# Patient Record
Sex: Female | Born: 1960 | Race: Black or African American | Hispanic: No | Marital: Single | Smoking: Former smoker
Health system: Southern US, Community
[De-identification: ages and names within clinical notes are randomized; demographics above are authoritative.]

## PROBLEM LIST (undated history)

## (undated) DIAGNOSIS — Z9119 Patient's noncompliance with other medical treatment and regimen: Secondary | ICD-10-CM

## (undated) DIAGNOSIS — I5022 Chronic systolic (congestive) heart failure: Secondary | ICD-10-CM

## (undated) DIAGNOSIS — F1210 Cannabis abuse, uncomplicated: Secondary | ICD-10-CM

## (undated) DIAGNOSIS — Z9289 Personal history of other medical treatment: Secondary | ICD-10-CM

## (undated) DIAGNOSIS — D219 Benign neoplasm of connective and other soft tissue, unspecified: Secondary | ICD-10-CM

## (undated) DIAGNOSIS — Z91199 Patient's noncompliance with other medical treatment and regimen due to unspecified reason: Secondary | ICD-10-CM

## (undated) DIAGNOSIS — E7800 Pure hypercholesterolemia, unspecified: Secondary | ICD-10-CM

## (undated) DIAGNOSIS — F1410 Cocaine abuse, uncomplicated: Secondary | ICD-10-CM

## (undated) DIAGNOSIS — I050 Rheumatic mitral stenosis: Secondary | ICD-10-CM

## (undated) DIAGNOSIS — I428 Other cardiomyopathies: Secondary | ICD-10-CM

## (undated) DIAGNOSIS — I472 Ventricular tachycardia: Secondary | ICD-10-CM

## (undated) DIAGNOSIS — F419 Anxiety disorder, unspecified: Secondary | ICD-10-CM

## (undated) DIAGNOSIS — J449 Chronic obstructive pulmonary disease, unspecified: Secondary | ICD-10-CM

## (undated) DIAGNOSIS — I509 Heart failure, unspecified: Secondary | ICD-10-CM

## (undated) DIAGNOSIS — Z95810 Presence of automatic (implantable) cardiac defibrillator: Secondary | ICD-10-CM

## (undated) DIAGNOSIS — R0602 Shortness of breath: Secondary | ICD-10-CM

## (undated) DIAGNOSIS — N83209 Unspecified ovarian cyst, unspecified side: Secondary | ICD-10-CM

## (undated) DIAGNOSIS — I272 Pulmonary hypertension, unspecified: Secondary | ICD-10-CM

## (undated) DIAGNOSIS — I10 Essential (primary) hypertension: Secondary | ICD-10-CM

## (undated) DIAGNOSIS — I080 Rheumatic disorders of both mitral and aortic valves: Secondary | ICD-10-CM

## (undated) DIAGNOSIS — F1010 Alcohol abuse, uncomplicated: Secondary | ICD-10-CM

## (undated) DIAGNOSIS — R911 Solitary pulmonary nodule: Secondary | ICD-10-CM

## (undated) HISTORY — PX: CARDIAC CATHETERIZATION: SHX172

## (undated) HISTORY — DX: Patient's noncompliance with other medical treatment and regimen: Z91.19

## (undated) HISTORY — DX: Cocaine abuse, uncomplicated: F14.10

## (undated) HISTORY — DX: Alcohol abuse, uncomplicated: F10.10

## (undated) HISTORY — DX: Other cardiomyopathies: I42.8

## (undated) HISTORY — PX: CARDIAC VALVE REPLACEMENT: SHX585

## (undated) HISTORY — DX: Cannabis abuse, uncomplicated: F12.10

## (undated) HISTORY — DX: Benign neoplasm of connective and other soft tissue, unspecified: D21.9

## (undated) HISTORY — DX: Pulmonary hypertension, unspecified: I27.20

## (undated) HISTORY — DX: Chronic systolic (congestive) heart failure: I50.22

## (undated) HISTORY — DX: Rheumatic mitral stenosis: I05.0

## (undated) HISTORY — DX: Rheumatic disorders of both mitral and aortic valves: I08.0

## (undated) HISTORY — DX: Unspecified ovarian cyst, unspecified side: N83.209

## (undated) HISTORY — PX: TONSILLECTOMY: SUR1361

## (undated) HISTORY — DX: Chronic obstructive pulmonary disease, unspecified: J44.9

## (undated) HISTORY — DX: Solitary pulmonary nodule: R91.1

## (undated) HISTORY — DX: Patient's noncompliance with other medical treatment and regimen due to unspecified reason: Z91.199

---
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
## 1998-01-22 ENCOUNTER — Emergency Department (HOSPITAL_COMMUNITY): Admission: EM | Admit: 1998-01-22 | Discharge: 1998-01-22 | Payer: Self-pay | Admitting: Emergency Medicine

## 1999-08-12 ENCOUNTER — Emergency Department (HOSPITAL_COMMUNITY): Admission: EM | Admit: 1999-08-12 | Discharge: 1999-08-12 | Payer: Self-pay | Admitting: Emergency Medicine

## 1999-08-13 ENCOUNTER — Emergency Department (HOSPITAL_COMMUNITY): Admission: EM | Admit: 1999-08-13 | Discharge: 1999-08-13 | Payer: Self-pay | Admitting: Emergency Medicine

## 1999-08-13 ENCOUNTER — Encounter: Payer: Self-pay | Admitting: Emergency Medicine

## 2004-02-02 ENCOUNTER — Inpatient Hospital Stay (HOSPITAL_COMMUNITY): Admission: EM | Admit: 2004-02-02 | Discharge: 2004-02-07 | Payer: Self-pay | Admitting: Emergency Medicine

## 2004-02-03 ENCOUNTER — Encounter: Payer: Self-pay | Admitting: Cardiology

## 2004-02-05 ENCOUNTER — Encounter (INDEPENDENT_AMBULATORY_CARE_PROVIDER_SITE_OTHER): Payer: Self-pay | Admitting: *Deleted

## 2004-02-27 ENCOUNTER — Ambulatory Visit: Payer: Self-pay | Admitting: Cardiology

## 2004-03-16 ENCOUNTER — Ambulatory Visit: Payer: Self-pay | Admitting: Cardiology

## 2004-04-22 ENCOUNTER — Ambulatory Visit: Payer: Self-pay | Admitting: Cardiology

## 2004-04-28 ENCOUNTER — Ambulatory Visit: Payer: Self-pay

## 2006-05-08 ENCOUNTER — Emergency Department (HOSPITAL_COMMUNITY): Admission: EM | Admit: 2006-05-08 | Discharge: 2006-05-08 | Payer: Self-pay | Admitting: Emergency Medicine

## 2006-05-19 ENCOUNTER — Emergency Department (HOSPITAL_COMMUNITY): Admission: EM | Admit: 2006-05-19 | Discharge: 2006-05-19 | Payer: Self-pay | Admitting: Emergency Medicine

## 2006-06-11 ENCOUNTER — Emergency Department (HOSPITAL_COMMUNITY): Admission: EM | Admit: 2006-06-11 | Discharge: 2006-06-11 | Payer: Self-pay | Admitting: Emergency Medicine

## 2006-10-14 ENCOUNTER — Inpatient Hospital Stay (HOSPITAL_COMMUNITY): Admission: EM | Admit: 2006-10-14 | Discharge: 2006-10-18 | Payer: Self-pay | Admitting: Emergency Medicine

## 2006-10-14 ENCOUNTER — Ambulatory Visit: Payer: Self-pay | Admitting: Internal Medicine

## 2006-10-14 ENCOUNTER — Encounter: Payer: Self-pay | Admitting: Cardiovascular Disease

## 2007-05-23 ENCOUNTER — Emergency Department (HOSPITAL_COMMUNITY): Admission: EM | Admit: 2007-05-23 | Discharge: 2007-05-23 | Payer: Self-pay | Admitting: Emergency Medicine

## 2007-09-11 ENCOUNTER — Emergency Department (HOSPITAL_COMMUNITY): Admission: EM | Admit: 2007-09-11 | Discharge: 2007-09-11 | Payer: Self-pay | Admitting: Emergency Medicine

## 2007-10-01 ENCOUNTER — Emergency Department (HOSPITAL_COMMUNITY): Admission: EM | Admit: 2007-10-01 | Discharge: 2007-10-02 | Payer: Self-pay | Admitting: Emergency Medicine

## 2007-12-14 ENCOUNTER — Ambulatory Visit: Payer: Self-pay | Admitting: Cardiology

## 2007-12-14 LAB — CONVERTED CEMR LAB
BUN: 10 mg/dL (ref 6–23)
CO2: 28 meq/L (ref 19–32)
Calcium: 8.4 mg/dL (ref 8.4–10.5)
Chloride: 105 meq/L (ref 96–112)
Creatinine, Ser: 1 mg/dL (ref 0.4–1.2)
GFR calc Af Amer: 76 mL/min
GFR calc non Af Amer: 63 mL/min
Glucose, Bld: 108 mg/dL — ABNORMAL HIGH (ref 70–99)
Potassium: 3.3 meq/L — ABNORMAL LOW (ref 3.5–5.1)
Sodium: 140 meq/L (ref 135–145)

## 2007-12-29 ENCOUNTER — Ambulatory Visit: Payer: Self-pay

## 2007-12-29 ENCOUNTER — Encounter: Payer: Self-pay | Admitting: Cardiology

## 2008-03-18 ENCOUNTER — Ambulatory Visit: Payer: Self-pay | Admitting: Cardiology

## 2008-07-17 ENCOUNTER — Emergency Department (HOSPITAL_COMMUNITY): Admission: EM | Admit: 2008-07-17 | Discharge: 2008-07-17 | Payer: Self-pay | Admitting: Emergency Medicine

## 2008-07-17 ENCOUNTER — Ambulatory Visit: Payer: Self-pay | Admitting: Cardiovascular Disease

## 2008-11-07 ENCOUNTER — Telehealth: Payer: Self-pay | Admitting: Cardiology

## 2008-12-27 DIAGNOSIS — I2510 Atherosclerotic heart disease of native coronary artery without angina pectoris: Secondary | ICD-10-CM | POA: Insufficient documentation

## 2008-12-27 DIAGNOSIS — I10 Essential (primary) hypertension: Secondary | ICD-10-CM

## 2008-12-27 DIAGNOSIS — J449 Chronic obstructive pulmonary disease, unspecified: Secondary | ICD-10-CM | POA: Insufficient documentation

## 2008-12-27 DIAGNOSIS — Z8639 Personal history of other endocrine, nutritional and metabolic disease: Secondary | ICD-10-CM

## 2008-12-27 DIAGNOSIS — R079 Chest pain, unspecified: Secondary | ICD-10-CM

## 2008-12-27 DIAGNOSIS — I428 Other cardiomyopathies: Secondary | ICD-10-CM | POA: Insufficient documentation

## 2008-12-27 DIAGNOSIS — Z862 Personal history of diseases of the blood and blood-forming organs and certain disorders involving the immune mechanism: Secondary | ICD-10-CM | POA: Insufficient documentation

## 2008-12-27 DIAGNOSIS — Z9189 Other specified personal risk factors, not elsewhere classified: Secondary | ICD-10-CM | POA: Insufficient documentation

## 2008-12-27 DIAGNOSIS — R1013 Epigastric pain: Secondary | ICD-10-CM

## 2008-12-27 DIAGNOSIS — J984 Other disorders of lung: Secondary | ICD-10-CM

## 2008-12-27 DIAGNOSIS — R609 Edema, unspecified: Secondary | ICD-10-CM | POA: Insufficient documentation

## 2008-12-27 DIAGNOSIS — R0602 Shortness of breath: Secondary | ICD-10-CM

## 2008-12-27 DIAGNOSIS — N83209 Unspecified ovarian cyst, unspecified side: Secondary | ICD-10-CM

## 2008-12-27 DIAGNOSIS — I5021 Acute systolic (congestive) heart failure: Secondary | ICD-10-CM

## 2008-12-27 DIAGNOSIS — Z8679 Personal history of other diseases of the circulatory system: Secondary | ICD-10-CM | POA: Insufficient documentation

## 2008-12-27 DIAGNOSIS — R0601 Orthopnea: Secondary | ICD-10-CM | POA: Insufficient documentation

## 2008-12-27 DIAGNOSIS — F1021 Alcohol dependence, in remission: Secondary | ICD-10-CM

## 2008-12-27 HISTORY — DX: Chronic obstructive pulmonary disease, unspecified: J44.9

## 2008-12-30 ENCOUNTER — Ambulatory Visit: Payer: Self-pay | Admitting: Cardiology

## 2009-01-02 LAB — CONVERTED CEMR LAB
BUN: 13 mg/dL (ref 6–23)
CO2: 28 meq/L (ref 19–32)
Calcium: 8.7 mg/dL (ref 8.4–10.5)
Chloride: 107 meq/L (ref 96–112)
Creatinine, Ser: 1 mg/dL (ref 0.4–1.2)
GFR calc non Af Amer: 75.97 mL/min (ref >60)
Glucose, Bld: 85 mg/dL (ref 70–99)
Magnesium: 1.8 mg/dL (ref 1.5–2.5)
Potassium: 4 meq/L (ref 3.5–5.1)
Sodium: 140 meq/L (ref 135–145)

## 2009-01-03 ENCOUNTER — Ambulatory Visit: Payer: Self-pay | Admitting: Oncology

## 2009-01-15 ENCOUNTER — Encounter: Payer: Self-pay | Admitting: Cardiology

## 2009-01-15 LAB — CBC WITH DIFFERENTIAL/PLATELET
BASO%: 0.7 % (ref 0.0–2.0)
Basophils Absolute: 0 10e3/uL (ref 0.0–0.1)
EOS%: 3.3 % (ref 0.0–7.0)
Eosinophils Absolute: 0.1 10e3/uL (ref 0.0–0.5)
HCT: 40.6 % (ref 34.8–46.6)
HGB: 14.1 g/dL (ref 11.6–15.9)
LYMPH%: 27.8 % (ref 14.0–49.7)
MCH: 32.3 pg (ref 25.1–34.0)
MCHC: 34.7 g/dL (ref 31.5–36.0)
MCV: 93.1 fL (ref 79.5–101.0)
MONO#: 0.6 10e3/uL (ref 0.1–0.9)
MONO%: 14.1 % — ABNORMAL HIGH (ref 0.0–14.0)
NEUT#: 2.3 10e3/uL (ref 1.5–6.5)
NEUT%: 54.1 % (ref 38.4–76.8)
Platelets: 201 10e3/uL (ref 145–400)
RBC: 4.36 10e6/uL (ref 3.70–5.45)
RDW: 13 % (ref 11.2–14.5)
WBC: 4.2 10e3/uL (ref 3.9–10.3)
lymph#: 1.2 10e3/uL (ref 0.9–3.3)

## 2009-01-15 LAB — CHCC SMEAR

## 2009-01-17 LAB — COMPREHENSIVE METABOLIC PANEL
AST: 23 U/L (ref 0–37)
Albumin: 4.5 g/dL (ref 3.5–5.2)
Alkaline Phosphatase: 60 U/L (ref 39–117)
Potassium: 3.8 mEq/L (ref 3.5–5.3)
Sodium: 139 mEq/L (ref 135–145)
Total Bilirubin: 0.6 mg/dL (ref 0.3–1.2)
Total Protein: 7.1 g/dL (ref 6.0–8.3)

## 2009-01-17 LAB — SPEP & IFE WITH QIG
Albumin ELP: 60.4 % (ref 55.8–66.1)
Alpha-1-Globulin: 4.4 % (ref 2.9–4.9)
Beta 2: 4.4 % (ref 3.2–6.5)
Gamma Globulin: 15.4 % (ref 11.1–18.8)
IgA: 380 mg/dL — ABNORMAL HIGH (ref 68–378)
IgM, Serum: 142 mg/dL (ref 60–263)

## 2009-01-17 LAB — KAPPA/LAMBDA LIGHT CHAINS
Kappa:Lambda Ratio: 1.03 (ref 0.26–1.65)
Lambda Free Lght Chn: 0.91 mg/dL (ref 0.57–2.63)

## 2009-01-21 LAB — UIFE/LIGHT CHAINS/TP QN, 24-HR UR
Alpha 2, Urine: DETECTED — AB
Free Lambda Lt Chains,Ur: <0.05 mg/dL (ref 0.08–1.01)
Free Lt Chn Excr Rate: 9.44 mg/d
Time: 24 hours
Total Protein, Urine-Ur/day: 18 mg/d (ref 10–140)
Total Protein, Urine: 1.1 mg/dL

## 2009-03-21 ENCOUNTER — Telehealth: Payer: Self-pay | Admitting: Cardiology

## 2009-05-02 ENCOUNTER — Encounter (INDEPENDENT_AMBULATORY_CARE_PROVIDER_SITE_OTHER): Payer: Self-pay | Admitting: *Deleted

## 2010-05-19 NOTE — Letter (Signed)
Summary: Appointment - Reminder 2  [facility], Main Office  [address]   [place], [place] [address]   Phone: [phone]  Fax: [phone]     May 02, 2009 MRN: [id number]   HANEEN BERNALES [address] [place], [place]  [address]   Dear Ms. Montroy,  Our records indicate that it is time to schedule a follow-up appointment. Dr.Crenshaw recommended that you follow up with [place] in March,2011. It is very important that we reach you to schedule this appointment. We look forward to participating in your health care needs. Please contact us at the number listed above at your earliest convenience to schedule your appointment.  If you are unable to make an appointment at this time, give [place] a call so we can update our records.     Sincerely,   Glass blower/designer

## 2010-05-26 ENCOUNTER — Other Ambulatory Visit: Payer: Self-pay | Admitting: Cardiology

## 2010-05-26 ENCOUNTER — Encounter: Payer: Self-pay | Admitting: Cardiology

## 2010-05-26 ENCOUNTER — Ambulatory Visit (INDEPENDENT_AMBULATORY_CARE_PROVIDER_SITE_OTHER): Payer: Self-pay | Admitting: Cardiology

## 2010-05-26 DIAGNOSIS — R1900 Intra-abdominal and pelvic swelling, mass and lump, unspecified site: Secondary | ICD-10-CM | POA: Insufficient documentation

## 2010-05-26 DIAGNOSIS — I428 Other cardiomyopathies: Secondary | ICD-10-CM

## 2010-05-26 DIAGNOSIS — I10 Essential (primary) hypertension: Secondary | ICD-10-CM

## 2010-05-26 DIAGNOSIS — R911 Solitary pulmonary nodule: Secondary | ICD-10-CM

## 2010-05-26 DIAGNOSIS — I359 Nonrheumatic aortic valve disorder, unspecified: Secondary | ICD-10-CM

## 2010-05-26 DIAGNOSIS — IMO0002 Reserved for concepts with insufficient information to code with codable children: Secondary | ICD-10-CM

## 2010-05-26 DIAGNOSIS — R0602 Shortness of breath: Secondary | ICD-10-CM

## 2010-05-26 LAB — HEPATIC FUNCTION PANEL
ALT: 13 U/L (ref 0–35)
AST: 21 U/L (ref 0–37)
Albumin: 3.9 g/dL (ref 3.5–5.2)
Alkaline Phosphatase: 60 U/L (ref 39–117)
Bilirubin, Direct: 0.1 mg/dL (ref 0.0–0.3)
Total Bilirubin: 0.3 mg/dL (ref 0.3–1.2)
Total Protein: 7.1 g/dL (ref 6.0–8.3)

## 2010-05-26 LAB — BASIC METABOLIC PANEL
BUN: 10 mg/dL (ref 6–23)
CO2: 26 mEq/L (ref 19–32)
Calcium: 9 mg/dL (ref 8.4–10.5)
Chloride: 106 mEq/L (ref 96–112)
Creatinine, Ser: 0.7 mg/dL (ref 0.4–1.2)
GFR: 117.87 mL/min (ref >60.00)
Glucose, Bld: 104 mg/dL — ABNORMAL HIGH (ref 70–99)
Potassium: 3.5 mEq/L (ref 3.5–5.1)
Sodium: 142 mEq/L (ref 135–145)

## 2010-05-26 LAB — CBC WITH DIFFERENTIAL/PLATELET
Basophils Absolute: 0 10*3/uL (ref 0.0–0.1)
Basophils Relative: 0.9 % (ref 0.0–3.0)
Eosinophils Absolute: 0.1 10*3/uL (ref 0.0–0.7)
Eosinophils Relative: 2 % (ref 0.0–5.0)
HCT: 39.6 % (ref 36.0–46.0)
Hemoglobin: 13.6 g/dL (ref 12.0–15.0)
Lymphocytes Relative: 46.5 % — ABNORMAL HIGH (ref 12.0–46.0)
Lymphs Abs: 1.2 10*3/uL (ref 0.7–4.0)
MCHC: 34.3 g/dL (ref 30.0–36.0)
MCV: 97.6 fl (ref 78.0–100.0)
Monocytes Absolute: 0.4 10*3/uL (ref 0.1–1.0)
Monocytes Relative: 16.8 % — ABNORMAL HIGH (ref 3.0–12.0)
Neutro Abs: 0.9 10*3/uL — ABNORMAL LOW (ref 1.4–7.7)
Neutrophils Relative %: 33.8 % — ABNORMAL LOW (ref 43.0–77.0)
Platelets: 210 10*3/uL (ref 150.0–400.0)
RBC: 4.06 Mil/uL (ref 3.87–5.11)
RDW: 13.5 % (ref 11.5–14.6)
WBC: 2.6 10*3/uL — ABNORMAL LOW (ref 4.5–10.5)

## 2010-05-26 LAB — TSH: TSH: 2.11 u[IU]/mL (ref 0.35–5.50)

## 2010-05-26 LAB — BRAIN NATRIURETIC PEPTIDE: Pro B Natriuretic peptide (BNP): 460.7 pg/mL — ABNORMAL HIGH (ref 0.0–100.0)

## 2010-05-27 ENCOUNTER — Telehealth: Payer: Self-pay | Admitting: Cardiology

## 2010-06-01 ENCOUNTER — Other Ambulatory Visit: Payer: Self-pay

## 2010-06-01 ENCOUNTER — Ambulatory Visit (HOSPITAL_COMMUNITY): Payer: Self-pay | Attending: Cardiology

## 2010-06-01 ENCOUNTER — Ambulatory Visit (INDEPENDENT_AMBULATORY_CARE_PROVIDER_SITE_OTHER)
Admission: RE | Admit: 2010-06-01 | Discharge: 2010-06-01 | Disposition: A | Discharge status: Home / Self Care | Payer: Self-pay | Source: Ambulatory Visit | Attending: Cardiology | Admitting: Cardiology

## 2010-06-01 DIAGNOSIS — I319 Disease of pericardium, unspecified: Secondary | ICD-10-CM | POA: Insufficient documentation

## 2010-06-01 DIAGNOSIS — I359 Nonrheumatic aortic valve disorder, unspecified: Secondary | ICD-10-CM

## 2010-06-01 DIAGNOSIS — I10 Essential (primary) hypertension: Secondary | ICD-10-CM | POA: Insufficient documentation

## 2010-06-01 DIAGNOSIS — J984 Other disorders of lung: Secondary | ICD-10-CM

## 2010-06-01 DIAGNOSIS — R229 Localized swelling, mass and lump, unspecified: Secondary | ICD-10-CM

## 2010-06-01 DIAGNOSIS — I2510 Atherosclerotic heart disease of native coronary artery without angina pectoris: Secondary | ICD-10-CM | POA: Insufficient documentation

## 2010-06-01 DIAGNOSIS — I080 Rheumatic disorders of both mitral and aortic valves: Secondary | ICD-10-CM | POA: Insufficient documentation

## 2010-06-01 DIAGNOSIS — I379 Nonrheumatic pulmonary valve disorder, unspecified: Secondary | ICD-10-CM | POA: Insufficient documentation

## 2010-06-01 DIAGNOSIS — R911 Solitary pulmonary nodule: Secondary | ICD-10-CM

## 2010-06-01 DIAGNOSIS — I079 Rheumatic tricuspid valve disease, unspecified: Secondary | ICD-10-CM | POA: Insufficient documentation

## 2010-06-01 HISTORY — DX: Essential (primary) hypertension: I10

## 2010-06-01 HISTORY — DX: Heart failure, unspecified: I50.9

## 2010-06-01 MED ORDER — IOHEXOL 300 MG/ML  SOLN
100.0000 mL | Freq: Once | INTRAMUSCULAR | Status: AC | PRN
  Administered 2010-06-01: 100 mL via INTRAVENOUS

## 2010-06-03 ENCOUNTER — Other Ambulatory Visit: Payer: Self-pay

## 2010-06-04 NOTE — Progress Notes (Signed)
Summary: returning call about medication changes  Medications Added CARVEDILOL 12.5 MG TABS (CARVEDILOL) Take one tablet by mouth twice a day CARVEDILOL 12.5 MG TABS (CARVEDILOL) Take one and one half tablet by mouth twice a day FUROSEMIDE 40 MG TABS (FUROSEMIDE) 1 and 1/2 tabs bid FUROSEMIDE 40 MG TABS (FUROSEMIDE) 1 tab by mouth two times a day ENALAPRIL MALEATE 20 MG TABS (ENALAPRIL MALEATE) Take one tablet by mouth twice a day AMLODIPINE BESYLATE 10 MG TABS (AMLODIPINE BESYLATE) Take one tablet by mouth daily KLOR-CON M20 20 MEQ CR-TABS (POTASSIUM CHLORIDE CRYS CR) take 1 tab daily       Phone Note Call from Patient Call back at Home Phone [phone]   Caller: Patient Summary of Call: Pt returning call about her medications Initial call taken by: Judie Grieve,  May 27, 2010 10:03 AM  Follow-up for Phone Call        05/27/10--1000am--    New/Updated Medications: FUROSEMIDE 40 MG TABS (FUROSEMIDE) 1 and 1/2 tabs bid KLOR-CON M20 20 MEQ CR-TABS (POTASSIUM CHLORIDE CRYS CR) take 1 tab daily Prescriptions: KLOR-CON M20 20 MEQ CR-TABS (POTASSIUM CHLORIDE CRYS CR) take 1 tab daily  #30 x 10   Entered by:   Ledon Snare, RN   Authorized by:   Ferman Hamming, MD, [facility]   Signed by:   Ledon Snare, RN on 05/27/2010   Method used:   Electronically to        [facility] (retail)       [address]       [place], [place]  [address]       Ph: [phone]       Fax: [phone]   RxID:   [id number]   Appended Document: returning call about medication changes 05/27/10--1030am--i did speak with ms Lemay who will change lasix to 60mg  two times a day and klor-con--20mg  1 tab daily--she will come in for repeat labs on 06/01/10

## 2010-06-04 NOTE — Assessment & Plan Note (Signed)
Summary: SOB/PER PT CALL=MJ      Allergies Added: ! PCN  Visit Type:  Follow-up  CC:  sob.  History of Present Illness: [name] is a pleasant  female who returns for followup today.  The patient has a long history of severe hypertension and a cardiomyopathy. Cardiac catheterization in June of 2008 showed ejection fraction of 25% and nonobstructive coronary disease. There was moderate aortic insufficiency. Her previous workup in 2005 suggested the possibility of amyloid.  At that time, she had a normal ferritin level, normal ACE level, normal TSH, and negative HIV.  A fat pad biopsy to rule out amyloidosis was negative.  However, we also scheduled her to have free kappa light chains which were elevated at 4.16 and her lambda free light chains were 0.97 which was normal.  However, her kappa to lambda free light chain ratio was elevated at 4.29.  Last echocardiogram in September of 2009 revealed an ejection fraction of 35 to 40%.  Left ventricular wall thickness was markedly increased. There was mild to moderate aortic insufficiency and the mean gradient was 21 mm of mercury. There was mild mitral regurgitation. The left atrium was moderately to markedly dilated. I last saw her in September of 2010. Note there has been issues with followup as she has not returned for extended length of time. She was seen by hematology oncology in September of 2010 for question amyloidosis. Followup laboratories were to be obtained. However she did not followup. Note she was seen in the emergency room in March of 2010 with chest pain. A CT revealed a right upper lobe nodule. Since I last saw her, the patient complains of dyspnea on exertion over the past 3 weeks. There is occasional orthopnea but no PND, pedal edema, palpitations, syncope or chest pain. There is no fevers, chills or productive cough.  Current Medications (verified): 1)  Carvedilol 12.5 Mg Tabs (Carvedilol) .... Take One and One Half Tablet By Mouth Twice  A Day 2)  Furosemide 40 Mg Tabs (Furosemide) .Marland Kitchen.. 1 Tab By Mouth Two Times A Day 3)  Enalapril Maleate 20 Mg Tabs (Enalapril Maleate) .... Take One Tablet By Mouth Twice A Day 4)  Amlodipine Besylate 10 Mg Tabs (Amlodipine Besylate) .... Take One Tablet By Mouth Daily  Allergies (verified): 1)  ! Pcn  Past History:  Past Medical History: PULMONARY NODULE, LEFT UPPER LOBE (ICD-518.89) EMPHYSEMA (ICD-492.8) OVARIAN CYSTIC MASS (ICD-620.2) COCAINE ABUSE, HX OF (ICD-V15.9) ALCOHOL ABUSE, HX OF (ICD-V11.3) PULMONARY HYPERTENSION, HX OF (ICD-V12.59) AORTIC INSUFFICIENCY, HX OF (ICD-424.1) MITRAL REGURGITATION,HX OF (ICD-396.3) CARDIOMYOPATHY (ICD-425.4) TUBAL PREGNANCY,HX OF (ICD-633.10) CANNABIS ABUSE, HX OF (ICD-V15.89) FIBROIDS, UTERUS,HX OF (ICD-218.9) PERS HX NONCOMPLIANCE W/MED TX PRS HAZARDS HLTH,HX OF (ICD-V15.81) HYPERTENSION (ICD-401.9) CHF (ICD-428.0)  Past Surgical History: TUBAL LIGATION, HX OF (ICD-V26.51)  Social History: Reviewed history from 12/30/2008 and no changes required. Single no children Tobacco Use - hx of  tobacco x 20 yrs Drug Use - hx of cocaine & Marijuana Tobacco Use - Yes. Patient states she has discontinued for one year.  Review of Systems       no fevers or chills, productive cough, hemoptysis, dysphasia, odynophagia, melena, hematochezia, dysuria, hematuria, rash, seizure activity, orthopnea, PND, pedal edema, claudication. Remaining systems are negative.   Vital Signs:  Patient profile:   50 year old female Height:      65 inches Weight:      164 pounds BMI:     27.39 Pulse rate:   76 / minute BP  sitting:   98 / 70  (right arm)  Vitals Entered By: Jacquelin Hawking, CMA (May 26, 2010 8:29 AM)  Physical Exam  General:  [name] well-nourished in no acute distress.  Skin is warm and dry.  HEENT is normal.  Neck is supple. No thyromegaly.  Chest is clear to auscultation with normal expansion.  Cardiovascular exam is  regular rate and rhythm. 3/6 systolic murmur left sternal border. Abdominal exam nontender or distended. Mass palpated in the lower abdomen. Patient states she has previously been diagnosed with fibroids. Extremities show no edema. neuro grossly intact    EKG  Procedure date:  05/26/2010  Findings:      Sinus rhythm at a rate of 67. Occasional PVC. Prolonged QT interval. Inferolateral T-wave inversion.  Impression & Recommendations:  Problem # 1:  DYSPNEA (ICD-786.05) Patient complains of dyspnea on exertion over the past 3 weeks. Her symptoms sound to be possibly related to congestive heart failure. However she is not volume overloaded on examination. Repeat echocardiogram. Check BNP. Will also check potassium, renal function, LFTs, TSH and CBC. She has a nodule on previous CT scan and has not had followup. Repeat CT scan of her chest. Will also scan her abdomen given the palpable mass. This may be related to fibroid tumors but there was a cystic mass on previous ultrasound. The patient has had significant problems with noncompliance and poor follow up in the past. She does not come for appointments for scheduled tests. I explained today that if she continues to not followup I will discharge her from the practice as I am unable to provide care in this setting. Patient also was being evaluated by oncology for question amyloid. I will arrange a followup appointment with Dr.Shadad. Her updated medication list for this problem includes:    Carvedilol 12.5 Mg Tabs (Carvedilol) .Marland Kitchen... Take one and one half tablet by mouth twice a day    Furosemide 40 Mg Tabs (Furosemide) .Marland Kitchen... 1 tab by mouth two times a day    Enalapril Maleate 20 Mg Tabs (Enalapril maleate) .Marland Kitchen... Take one tablet by mouth twice a day    Amlodipine Besylate 10 Mg Tabs (Amlodipine besylate) .Marland Kitchen... Take one tablet by mouth daily  Problem # 2:  OVARIAN CYSTIC MASS (ICD-620.2) Plan abdominal CT.  Problem # 3:  AORTIC INSUFFICIENCY, HX  OF (ICD-424.1)  Patient with history of aortic stenosis/aortic insufficiency. Repeat echocardiogram. Her updated medication list for this problem includes:    Carvedilol 12.5 Mg Tabs (Carvedilol) .Marland Kitchen... Take one and one half tablet by mouth twice a day    Furosemide 40 Mg Tabs (Furosemide) .Marland Kitchen... 1 tab by mouth two times a day    Enalapril Maleate 20 Mg Tabs (Enalapril maleate) .Marland Kitchen... Take one tablet by mouth twice a day  Her updated medication list for this problem includes:    Carvedilol 12.5 Mg Tabs (Carvedilol) .Marland Kitchen... Take one and one half tablet by mouth twice a day    Furosemide 40 Mg Tabs (Furosemide) .Marland Kitchen... 1 tab by mouth two times a day    Enalapril Maleate 20 Mg Tabs (Enalapril maleate) .Marland Kitchen... Take one tablet by mouth twice a day  Orders: Echocardiogram (Echo)  Problem # 4:  CARDIOMYOPATHY (ICD-425.4)  Continued ACE inhibitor, beta blocker and diuretic. Repeat echocardiogram. Her updated medication list for this problem includes:    Carvedilol 12.5 Mg Tabs (Carvedilol) .Marland Kitchen... Take one and one half tablet by mouth twice a day    Furosemide 40 Mg Tabs (Furosemide) .Marland KitchenMarland KitchenMarland KitchenMarland Kitchen  1 tab by mouth two times a day    Enalapril Maleate 20 Mg Tabs (Enalapril maleate) .Marland Kitchen... Take one tablet by mouth twice a day    Amlodipine Besylate 10 Mg Tabs (Amlodipine besylate) .Marland Kitchen... Take one tablet by mouth daily  Orders: EKG w/ Interpretation (93000) TLB-BMP (Basic Metabolic Panel-BMET) (80048-METABOL) TLB-BNP (B-Natriuretic Peptide) (83880-BNPR) TLB-CBC Platelet - w/Differential (85025-CBCD) TLB-Hepatic/Liver Function Pnl (80076-HEPATIC) TLB-TSH (Thyroid Stimulating Hormone) (84443-TSH) Echocardiogram (Echo)  Her updated medication list for this problem includes:    Carvedilol 12.5 Mg Tabs (Carvedilol) .Marland Kitchen... Take one and one half tablet by mouth twice a day    Furosemide 40 Mg Tabs (Furosemide) .Marland Kitchen... 1 tab by mouth two times a day    Enalapril Maleate 20 Mg Tabs (Enalapril maleate) .Marland Kitchen... Take one tablet  by mouth twice a day    Amlodipine Besylate 10 Mg Tabs (Amlodipine besylate) .Marland Kitchen... Take one tablet by mouth daily  Problem # 5:  HYPERTENSION (ICD-401.9)  Blood pressure controlled. Continue present medication. Her updated medication list for this problem includes:    Carvedilol 12.5 Mg Tabs (Carvedilol) .Marland Kitchen... Take one and one half tablet by mouth twice a day    Furosemide 40 Mg Tabs (Furosemide) .Marland Kitchen... 1 tab by mouth two times a day    Enalapril Maleate 20 Mg Tabs (Enalapril maleate) .Marland Kitchen... Take one tablet by mouth twice a day    Amlodipine Besylate 10 Mg Tabs (Amlodipine besylate) .Marland Kitchen... Take one tablet by mouth daily  Her updated medication list for this problem includes:    Carvedilol 12.5 Mg Tabs (Carvedilol) .Marland Kitchen... Take one and one half tablet by mouth twice a day    Furosemide 40 Mg Tabs (Furosemide) .Marland Kitchen... 1 tab by mouth two times a day    Enalapril Maleate 20 Mg Tabs (Enalapril maleate) .Marland Kitchen... Take one tablet by mouth twice a day    Amlodipine Besylate 10 Mg Tabs (Amlodipine besylate) .Marland Kitchen... Take one tablet by mouth daily  Problem # 6:  PERS HX NONCOMPLIANCE W/MED TX PRS HAZARDS HLTH,HX OF (ICD-V15.81)  Other Orders: CT Scan  (CT Scan) Misc. Referral (Misc. Ref)  Patient Instructions: 1)  Your physician recommends that you schedule a follow-up appointment in: 4 WEEKS WITH DR. Kylieann Eagles 2)  Your physician recommends that you have blood work today. 3)  Your physician recommends that you continue on your current medications as directed. Please refer to the Current Medication list given to you today. 4)  Non-Cardiac CT scanning, (CAT scanning), is a noninvasive, special x-ray that produces cross-sectional images of the body using x-rays and a computer. CT scans help physicians diagnose and treat medical conditions. For some CT exams, a contrast material is used to enhance visibility in the area of the body being studied. CT scans provide greater clarity and reveal more details than regular  x-ray exams. This will be of your chest and abdomen. We will call you to set this up once we get your blood work back.  5)  Your physician has requested that you have an echocardiogram.  Echocardiography is a painless test that uses sound waves to create images of your heart. It provides your doctor with information about the size and shape of your heart and how well your heart's chambers and valves are working.  This procedure takes approximately one hour. There are no restrictions for this procedure. 6)  You have been referred to followup with Dr. Clelia Croft with hematology/oncology.

## 2010-06-18 ENCOUNTER — Other Ambulatory Visit: Payer: Self-pay | Admitting: Oncology

## 2010-06-18 ENCOUNTER — Encounter (HOSPITAL_BASED_OUTPATIENT_CLINIC_OR_DEPARTMENT_OTHER): Payer: Self-pay | Admitting: Oncology

## 2010-06-18 DIAGNOSIS — D472 Monoclonal gammopathy: Secondary | ICD-10-CM

## 2010-06-18 DIAGNOSIS — I428 Other cardiomyopathies: Secondary | ICD-10-CM

## 2010-06-18 LAB — CBC WITH DIFFERENTIAL/PLATELET
BASO%: 0.6 % (ref 0.0–2.0)
Basophils Absolute: 0 10*3/uL (ref 0.0–0.1)
EOS%: 0.8 % (ref 0.0–7.0)
Eosinophils Absolute: 0 10*3/uL (ref 0.0–0.5)
HCT: 41.7 % (ref 34.8–46.6)
HGB: 14.3 g/dL (ref 11.6–15.9)
LYMPH%: 40.5 % (ref 14.0–49.7)
MCH: 32.6 pg (ref 25.1–34.0)
MCHC: 34.3 g/dL (ref 31.5–36.0)
MCV: 95.2 fL (ref 79.5–101.0)
MONO#: 0.4 10*3/uL (ref 0.1–0.9)
MONO%: 13.3 % (ref 0.0–14.0)
NEUT#: 1.5 10*3/uL (ref 1.5–6.5)
NEUT%: 44.8 % (ref 38.4–76.8)
Platelets: 203 10*3/uL (ref 145–400)
RBC: 4.38 10*6/uL (ref 3.70–5.45)
RDW: 13.4 % (ref 11.2–14.5)
WBC: 3.3 10*3/uL — ABNORMAL LOW (ref 3.9–10.3)
lymph#: 1.3 10*3/uL (ref 0.9–3.3)

## 2010-06-18 LAB — COMPREHENSIVE METABOLIC PANEL
ALT: 16 U/L (ref 0–35)
AST: 31 U/L (ref 0–37)
Albumin: 4.1 g/dL (ref 3.5–5.2)
Alkaline Phosphatase: 64 U/L (ref 39–117)
BUN: 20 mg/dL (ref 6–23)
CO2: 24 mEq/L (ref 19–32)
Calcium: 8.9 mg/dL (ref 8.4–10.5)
Chloride: 104 mEq/L (ref 96–112)
Creatinine, Ser: 1.02 mg/dL (ref 0.40–1.20)
Glucose, Bld: 121 mg/dL — ABNORMAL HIGH (ref 70–99)
Potassium: 3.6 mEq/L (ref 3.5–5.3)
Sodium: 138 mEq/L (ref 135–145)
Total Bilirubin: 1 mg/dL (ref 0.3–1.2)
Total Protein: 7.5 g/dL (ref 6.0–8.3)

## 2010-06-19 ENCOUNTER — Ambulatory Visit: Payer: Self-pay | Admitting: Cardiology

## 2010-06-22 LAB — SPEP & IFE WITH QIG
Albumin ELP: 59.4 % (ref 55.8–66.1)
Alpha-1-Globulin: 4.4 % (ref 2.9–4.9)
Alpha-2-Globulin: 10.4 % (ref 7.1–11.8)
Beta 2: 5.1 % (ref 3.2–6.5)
Beta Globulin: 6.4 % (ref 4.7–7.2)
Gamma Globulin: 14.3 % (ref 11.1–18.8)
IgA: 406 mg/dL — ABNORMAL HIGH (ref 68–378)
IgG (Immunoglobin G), Serum: 1250 mg/dL (ref 694–1618)
IgM, Serum: 167 mg/dL (ref 60–263)
Total Protein, Serum Electrophoresis: 8 g/dL (ref 6.0–8.3)

## 2010-06-22 LAB — KAPPA/LAMBDA LIGHT CHAINS
Kappa free light chain: 0.94 mg/dL (ref 0.33–1.94)
Kappa:Lambda Ratio: 0.49 (ref 0.26–1.65)
Lambda Free Lght Chn: 1.93 mg/dL (ref 0.57–2.63)

## 2010-06-24 ENCOUNTER — Encounter: Payer: Self-pay | Admitting: Cardiology

## 2010-07-02 ENCOUNTER — Encounter (INDEPENDENT_AMBULATORY_CARE_PROVIDER_SITE_OTHER): Payer: Self-pay | Admitting: *Deleted

## 2010-07-07 ENCOUNTER — Ambulatory Visit: Payer: Self-pay | Admitting: Cardiology

## 2010-07-07 NOTE — Letter (Signed)
Summary: Appointment - Reschedule  [facility], Main Office  [address]   [place], [place] [address]   Phone: [phone]  Fax: [phone]        July 02, 2010 MRN: [id number]       [name] [address] [place], [place]  [address]       Dear Ms. Guess,   Due to a change in our office schedule, your appointment on July 07, 2010 at 11:00 am must be changed.  It is very important that we reach you to reschedule this appointment. We look forward to participating in your health care needs. Please contact us at the number listed above at your earliest convenience to reschedule this appointment.     Sincerely,  Glass blower/designer

## 2010-07-20 ENCOUNTER — Ambulatory Visit: Payer: Self-pay | Admitting: Physician Assistant

## 2010-07-30 LAB — GC/CHLAMYDIA PROBE AMP, GENITAL
Chlamydia, DNA Probe: NEGATIVE
GC Probe Amp, Genital: NEGATIVE

## 2010-07-30 LAB — DIFFERENTIAL
Basophils Absolute: 0 10*3/uL (ref 0.0–0.1)
Basophils Relative: 0 % (ref 0–1)
Eosinophils Relative: 1 % (ref 0–5)
Lymphocytes Relative: 20 % (ref 12–46)
Monocytes Absolute: 0.6 10*3/uL (ref 0.1–1.0)
Neutro Abs: 2 10*3/uL (ref 1.7–7.7)

## 2010-07-30 LAB — BASIC METABOLIC PANEL
BUN: 8 mg/dL (ref 6–23)
CO2: 24 mEq/L (ref 19–32)
Calcium: 8.7 mg/dL (ref 8.4–10.5)
Creatinine, Ser: 0.81 mg/dL (ref 0.4–1.2)
GFR calc non Af Amer: >60 mL/min (ref >60)
Glucose, Bld: 95 mg/dL (ref 70–99)

## 2010-07-30 LAB — CBC
MCHC: 34.7 g/dL (ref 30.0–36.0)
Platelets: 205 10*3/uL (ref 150–400)
RDW: 14.4 % (ref 11.5–15.5)

## 2010-07-30 LAB — URINALYSIS, ROUTINE W REFLEX MICROSCOPIC
Bilirubin Urine: NEGATIVE
Ketones, ur: NEGATIVE mg/dL
Nitrite: NEGATIVE
Urobilinogen, UA: 1 mg/dL (ref 0.0–1.0)

## 2010-07-30 LAB — D-DIMER, QUANTITATIVE: D-Dimer, Quant: 1.84 ug/mL-FEU — ABNORMAL HIGH (ref 0.00–0.48)

## 2010-07-30 LAB — RAPID URINE DRUG SCREEN, HOSP PERFORMED
Amphetamines: NOT DETECTED
Benzodiazepines: NOT DETECTED

## 2010-07-30 LAB — WET PREP, GENITAL

## 2010-07-30 LAB — POCT CARDIAC MARKERS
Myoglobin, poc: 36.7 ng/mL (ref 12–200)
Troponin i, poc: <0.05 ng/mL (ref 0.00–0.09)

## 2010-08-03 ENCOUNTER — Encounter: Payer: Self-pay | Admitting: Physician Assistant

## 2010-08-03 ENCOUNTER — Ambulatory Visit (INDEPENDENT_AMBULATORY_CARE_PROVIDER_SITE_OTHER): Payer: Self-pay | Admitting: Physician Assistant

## 2010-08-03 VITALS — BP 108/70 | HR 78 | Resp 18 | Ht 66.0 in | Wt 162.8 lb

## 2010-08-03 DIAGNOSIS — I428 Other cardiomyopathies: Secondary | ICD-10-CM

## 2010-08-03 DIAGNOSIS — I5042 Chronic combined systolic (congestive) and diastolic (congestive) heart failure: Secondary | ICD-10-CM

## 2010-08-03 DIAGNOSIS — I38 Endocarditis, valve unspecified: Secondary | ICD-10-CM | POA: Insufficient documentation

## 2010-08-03 DIAGNOSIS — I10 Essential (primary) hypertension: Secondary | ICD-10-CM

## 2010-08-03 DIAGNOSIS — I2510 Atherosclerotic heart disease of native coronary artery without angina pectoris: Secondary | ICD-10-CM

## 2010-08-03 DIAGNOSIS — D259 Leiomyoma of uterus, unspecified: Secondary | ICD-10-CM

## 2010-08-03 NOTE — Assessment & Plan Note (Signed)
Resolved by recent CT.  Patient no longer smokes.

## 2010-08-03 NOTE — Assessment & Plan Note (Signed)
Controlled.  Continue current therapy.  

## 2010-08-03 NOTE — Progress Notes (Signed)
History of Present Illness: Primary Cardiologist: Dr. Olga Millers  [name] is a 50 y.o. female with a long history of severe hypertension and a nonischemic cardiomyopathy. Cardiac catheterization in June of 2008 showed ejection fraction of 25% and nonobstructive coronary disease. There was moderate aortic insufficiency. Her previous workup in 2005 suggested the possibility of amyloid.  At that time, she had a normal ferritin level, normal ACE level, normal TSH, and negative HIV.  A fat pad biopsy to rule out amyloidosis was negative.  However, we also scheduled her to have free kappa light chains which were elevated at 4.16 and her lambda free light chains were 0.97 which was normal.  However, her kappa to lambda free light chain ratio was elevated at 4.29.  Last echocardiogram in September of 2009 revealed an ejection fraction of 35 to 40%.  Left ventricular wall thickness was markedly increased. There was mild to moderate aortic insufficiency and the mean gradient was 21 mm of mercury. There was mild mitral regurgitation. The left atrium was moderately to markedly dilated.  She was seen by hematology/oncology in September of 2010 for question amyloidosis. Followup laboratories were to be obtained. However she did not followup. Note she was seen in the emergency room in March of 2010 with chest pain.  A CT revealed a right upper lobe nodule.  She was seen by Dr. Jens Som in 2/12 with increased dyspnea.  BNP was elevated and her Lasix was increased.  She was set up for a follow up echo that demonstrated improved LVF with EF 45-50%, severe LVH and valvular heart disease with mild to mod AS, mod. AI, mild to mod MS and mod MR.  She had follow up CT arranged of her Chest which was just read today demonstrated resolution of her RUL nodule.  Abdominal CT was notable for a large uterine fibroid and she was asked to follow up with GYN.  Labs in the computer indicate follow up on her Kappa and Lambda free  light chains on 3/1 which were in the normal range with normal ratio and there was no evidence of monoclonal protein.   She returns for follow up today.  Overall, she is doing better.  She describes NYHA 2b symptoms.  No chest pain.  She occasionally has awoken short of breath.  No weight gain or edema.  No syncope.  She denies orthopnea.  She is not smoking.  She states she saw hematology in follow up and they did not feel she has amyloid.     Past Medical History  Diagnosis Date  . CHF (congestive heart failure)   . Pulmonary nodule, left     f/u chest CT 2/12: interval clearing of RUL air space nodule, borderline enlarged mediastinal lymph nodes stable, pul. arterial HTN  . Emphysema   . Ovarian cyst     ovarian cystic mass  . Cocaine abuse   . Alcohol abuse   . Hypertension     pulmonary  . 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 Mitral stenosis   . Nonischemic cardiomyopathy     a. cath 6/08: pLAD 30%, mLAD 30%, oCFX 40%, dCVS 40%; OM 50%; dRCA 40%;    b. echo 06/01/10: severe LVH; EF 45-50%; inf HK; mild to mod AS (mean 30), mod. AI; mild to mod MS; mod MR; severe LAE, mod TR, mod PR, PASP 59;  c.  w/u for Amyloid done in past  . Tubal pregnancy     Hx  .  Cannabis abuse   . Fibroids     abd. CT 2/12: large fibroid uterus  . Personal history of noncompliance with medical treatment, presenting hazards to health   . Pulmonary HTN     Current Outpatient Prescriptions  Medication Sig Dispense Refill  . amLODipine (NORVASC) 10 MG tablet Take 10 mg by mouth daily.        . carvedilol (COREG) 12.5 MG tablet Take 12.5 mg by mouth. Take 1 & 1/2 tablets twice daily       . enalapril (VASOTEC) 20 MG tablet Take 20 mg by mouth 2 (two) times daily.        . furosemide (LASIX) 40 MG tablet Take 40 mg by mouth. Take 1 & 1/2 tablets two times daily       . potassium chloride (KLOR-CON) 20 MEQ packet Take 20 mEq by mouth daily.          Allergies  Allergen Reactions  .  Penicillins     Vital Signs: BP 108/70  Pulse 78  Resp 18  Ht 5\' 6"  (1.676 m)  Wt 162 lb 12.8 oz (73.846 kg)  BMI 26.28 kg/m2  PHYSICAL EXAM: Well nourished, well developed, in no acute distress HEENT: normal Neck: no JVD Cardiac:  normal S1, S2; RRR; 3/6 systolic murmur along LLSB and at the apex Lungs:  clear to auscultation bilaterally, no wheezing, rhonchi or rales Abd: soft, nontender, enlarged uterus sized approximately at [redacted] weeks gestation Ext: no edema Skin: warm and dry Neuro:  CNs 2-12 intact, no focal abnormalities noted  ASSESSMENT AND PLAN:

## 2010-08-03 NOTE — Assessment & Plan Note (Signed)
No angina.  Recommend she take an ASA qd.  I am not sure why she is not taking a statin.  Compliance has been an issue in the past.  This can be readdressed at her follow up.

## 2010-08-03 NOTE — Assessment & Plan Note (Signed)
Echo done in 2/12 with AS/AI and MS/MR.

## 2010-08-03 NOTE — Patient Instructions (Signed)
Lab today---BMP/BNP 428.42  401.1  425.4   Referral has been made to GYN for evaluation and management of uterine fibroids.  Referral has been made to internal medicine to establish with a primary care doctor.  Schedule an appointment to see Dr Jens Som in 3 months.

## 2010-08-03 NOTE — Assessment & Plan Note (Signed)
Overall stable.  Will continue current dose of lasix.  Check a bmet and bnp today.  Adjust lasix more if bnp still elevated.  Workup recently completed by hematology.  She does not appear to have amyloid.  Follow up with Dr. Jens Som in 3 months.

## 2010-08-03 NOTE — Assessment & Plan Note (Signed)
Refer to gynecology for evaluation and management.  Also, refer to primary care.

## 2010-08-04 ENCOUNTER — Telehealth: Payer: Self-pay | Admitting: Cardiology

## 2010-08-04 DIAGNOSIS — I5042 Chronic combined systolic (congestive) and diastolic (congestive) heart failure: Secondary | ICD-10-CM

## 2010-08-04 LAB — BRAIN NATRIURETIC PEPTIDE: Pro B Natriuretic peptide (BNP): 770.1 pg/mL — ABNORMAL HIGH (ref 0.0–100.0)

## 2010-08-04 LAB — BASIC METABOLIC PANEL
Chloride: 106 mEq/L (ref 96–112)
Creatinine, Ser: 0.9 mg/dL (ref 0.4–1.2)
Potassium: 3.8 mEq/L (ref 3.5–5.1)

## 2010-08-04 MED ORDER — POTASSIUM CHLORIDE 20 MEQ PO PACK: 20.0000 meq | PACK | Freq: Two times a day (BID) | ORAL | Status: DC

## 2010-08-04 MED ORDER — FUROSEMIDE 40 MG PO TABS: ORAL_TABLET | ORAL | Status: DC

## 2010-08-04 NOTE — Telephone Encounter (Signed)
Pt calling re bloodwork results.

## 2010-08-04 NOTE — Telephone Encounter (Signed)
Spoke with pt, she is aware of lab results. She will increase her meds and repeat labs on Monday next week [name]

## 2010-08-11 ENCOUNTER — Other Ambulatory Visit: Payer: Self-pay | Admitting: *Deleted

## 2010-08-11 ENCOUNTER — Other Ambulatory Visit (INDEPENDENT_AMBULATORY_CARE_PROVIDER_SITE_OTHER): Payer: Self-pay | Admitting: *Deleted

## 2010-08-11 DIAGNOSIS — I5042 Chronic combined systolic (congestive) and diastolic (congestive) heart failure: Secondary | ICD-10-CM

## 2010-08-11 LAB — BASIC METABOLIC PANEL
CO2: 21 mEq/L (ref 19–32)
Calcium: 8.9 mg/dL (ref 8.4–10.5)
Chloride: 109 mEq/L (ref 96–112)
Sodium: 141 mEq/L (ref 135–145)

## 2010-08-11 LAB — BRAIN NATRIURETIC PEPTIDE: Pro B Natriuretic peptide (BNP): 454 pg/mL — ABNORMAL HIGH (ref 0.0–100.0)

## 2010-09-01 NOTE — Consult Note (Signed)
[name], Bell NO.:  [id number]   MEDICAL RECORD NO.:  [id number]          PATIENT TYPE:  EMS   LOCATION:  MAJO                         FACILITY:  [facility]   PHYSICIAN:  Verne Carrow, MDDATE OF BIRTH:  03/07/1961   DATE OF CONSULTATION:  DATE OF DISCHARGE:                                 CONSULTATION   PRIMARY CARDIOLOGIST:  Madolyn Frieze. Jens Som, MD, [facility]   PRIMARY CARE PHYSICIAN:  Unlisted.   REASON FOR CONSULTATION:  Chest pain.   HISTORY OF PRESENT ILLNESS:  A 50 year old African American female  admitted to the emergency room with complaints of chest pain on the left  side, started last week, causing trouble breathing, constant tightness  since over 5 days in duration.  The pain continued with associated  nausea.  The patient also has been complaining of lower abdominal pain  over the last week and soreness with associated vomiting.  The patient  has prior history of severe nonischemic cardiomyopathy, aortic  insufficiency, and nonobstructive CAD.   The patient also has a past medical history of EtOH and cocaine abuse in  the past, although she denies.  The patient's main complaint is lower  abdominal pain, but she has had some complaints of the left-sided chest  pain as well.  ER physician felt that she may be in CHF and gave her 40  mg of IV Lasix.  It was found that the patient was hypokalemic on  admission at 3.2 and this was repleted post IV Lasix.  The patient  currently states she is feeling much better except for the complaints of  abdominal pain.   REVIEW OF SYSTEMS:  Positive for chest pain, shortness of breath,  nausea, and severe lower abdominal pain.  All other systems were  reviewed and found to be negative.   PAST MEDICAL HISTORY:  1. Hypertension greater than 10 years, questionable amyloid.  2. Nonischemic cardiomyopathy with an EF of 35% per recent echo.  3. History of medical noncompliance.  4. History of uterine  fibroids.  5. History of tobacco or drug use.      a.     Marijuana and cocaine.  6. CAD.      a.     Status post catheterization in June 2008 revealing       nonobstructive CAD, 30% proximal and 30% mid LAD, 40% ostial and       40% distal circ, 50% marginal branch and 40% distal RCA.   PAST SURGICAL HISTORY:  Tube removal and oophorectomy.   SOCIAL HISTORY:  She lives in [place] with her boyfriend.  She states  she stopped smoking 1 year ago.  She continues to drink beer once a week  and denies drug use.   FAMILY HISTORY:  Mother deceased with MI and hypertension.  Father  deceased with ruptured esophagus.  She has 2 brothers who she has  unknown history.   CURRENT MEDICATIONS AT HOME:  1. K-Dur 20 mEq b.i.d.  2. Lasix 40 mg b.i.d.  3. Amlodipine 10 mg daily.  4. Coreg 12.5 mg b.i.d.  5. Enalapril 20 mg b.i.d.  ALLERGIES:  To PENICILLIN.   CURRENT LABORATORY DATA:  Troponin less than 0.05.  Sodium 137,  potassium 3.2, chloride 102, CO2 24, BUN 8, creatinine 0.81, glucose 95.  Hemoglobin 14.4, hematocrit 41.5, white blood cells 3.4, platelets 205.  Chest x-ray revealing cardiomegaly with mild venous hypertension.  No  frank CHF or active disease at this time.  CT scan negative for PE, 0.9  cm right upper lobe nodule worrisome for carcinoma.  EKG revealing sinus  tachycardia with PVCs, ventricular rate of 92 beats per minute.   PHYSICAL EXAMINATION:  VITAL SIGNS:  Blood pressure 138/82, pulse 78,  respirations 18, temperature 97.4, O2 sat 98% on 2 L.  GENERAL:  She is awake, alert, and oriented.  HEENT:  Head is normocephalic and atraumatic.  Eyes, PERRLA.  Mucous  membranes in mouth pink and moist.  Tongue is midline.  NECK:  Supple with mild JVD, probably 5 cm.  CARDIOVASCULAR:  Regular rate and rhythm with harsh 2/6 systolic murmur  auscultated.  Pulses are 2+ and equal without bruits.  LUNGS:  Essentially are clear to auscultation without wheezes, rales, or   rhonchi.  ABDOMEN:  Exclusively tender in the lower abdominal area and pelvic  area.  Bowel sounds 2+.  There is no distention noted.  EXTREMITIES:  Without clubbing, cyanosis, or edema.  NEUROLOGIC:  Cranial nerves II through XII are grossly intact.   IMPRESSION:  1. Atypical chest pain.  2. Hypokalemia.  3. Abdominal pain.  4. History of hypertension.  5. History of nonischemic cardiomyopathy.   PLAN:  This is a 50 year old African American female who presented to  the emergency room with left-sided chest pain and abdominal pain.  The  patient has been seen and examined by myself and Dr. Verne Carrow.  The patient has on and off chest pain for 5 days, but her  main complaint is severe abdominal pain and tenderness.  She denies  fever, chills, dysuria, vaginal discharge.  No change in bowel habits.  The exam reveals severe abdominal tenderness, positive for guarding and  TTP right lower quadrant and left lower quadrant, mild volume overload  on  exam.  We will be glad to follow as a consultation, diurese with IV  Lasix, rule out for MI and serial enzymes.  Medicine will be admitting  the patient to workup abdominal pain.  We will follow the patient making  further recommendations from a cardiology standpoint if necessary this  is catheter more.      Bettey Mare. Lyman Bishop, NP      Verne Carrow, MD  Electronically Signed    KML/MEDQ  D:  07/17/2008  T:  07/18/2008  Job:  [id number]

## 2010-09-01 NOTE — Assessment & Plan Note (Signed)
[facility]                            CARDIOLOGY OFFICE NOTE   AZARYA, OCONNELL                     MRN:          [id number]  DATE:03/18/2008                            DOB:          10-09-1960    [name] is a 50 year old female, who returns for followup today.  The  patient has a long history of severe hypertension and a cardiomyopathy.  Her previous workup in 2005 suggested the possibility of amyloid.  At  that time, she had a normal ferritin level, normal ACE level, normal  TSH, and negative HIV.  A fat pad biopsy to rule out amyloidosis was  negative.  However, we also scheduled her to have free kappa light  chains which were elevated at 4.16 and her lambda free light chains were  0.97 which was normal.  However, her kappa to lambda free light chain  ratio was elevated at 4.29.  We have recommended that she go to [facility] for full evaluation to exclude amyloid.  Apparently, she had  been arranged to see Dr. Modesto Charon and also to have bone marrow biopsy by Dr.  Theresia Lo, but she did not show for these appointments.  She did not  return to this office from January 2006 until August 2009.  Please refer  that note for details.  Since I last saw her, she does have some dyspnea  on exertion which is unchanged.  There is no orthopnea, PND, pedal  edema, palpitations, presyncope, syncope, or chest pain.   MEDICATIONS:  1. Norvasc 10 mg p.o. daily.  She is not taking this for the past 4      days.  2. She is also on carvedilol 12.5 mg p.o. b.i.d.  3. Lasix 40 mg p.o. b.i.d.  4. Enalapril 20 mg p.o. b.i.d.  5. She is out of potassium.   PHYSICAL EXAMINATION:  VITAL SIGNS:  Blood pressure of 178/97 and pulse  of 95.  She weighs 162 pounds.  HEENT:  Normal.  NECK:  Supple.  CHEST:  Clear.  CARDIOVASCULAR:  Regular rate.  ABDOMEN:  No tenderness.  EXTREMITIES:  No edema.   DIAGNOSES:  1. 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      and she states she would be willing to go to [facility] for further      evaluation.  We will try to arrange followup appointments as      previously.  I am not optimistic she will keep these.  However, I      think it would be beneficial to her.  2. Nonischemic cardiomyopathy - she will continue her ACE inhibitor,      beta-blocker, and Lasix.  We will try to increase her Coreg in the      future as tolerated by pulse and blood pressure.  Note, we did      repeat her echocardiogram on December 29, 2007.  Her left      ventricular function was felt to be in the 35%-40% range.  There      was mild-to-moderate aortic insufficiency which  had improved      compared to previous.  There was also mild mitral regurgitation.      The left atrium was moderately-to-markedly dilated.  Note,      apparently her pericardial effusion had improved.  3. Hypertension - blood pressure is elevated, but she has not taken      her Norvasc.  We will resume her Norvasc 10 mg p.o. daily.  I  have      asked to track her blood pressure at home and we will increase her      Coreg further if her blood pressure is not controlled on the above      measures.  4. History of tobacco and drug use - she states she is avoiding this.  5. History of noncompliance - we again discussed the importance of      followup appointments as well as medications.  Hopefully, she will      improve in this area.   I will see her back in 4 weeks to adjust her blood pressure regimen.     [name], MD, [facility]  Electronically Signed    BSC/MedQ  DD: 03/18/2008  DT: 03/19/2008  Job #: [id number]

## 2010-09-01 NOTE — Discharge Summary (Signed)
[name], ROSELLO            ACCOUNT NO.:  [id number]   MEDICAL RECORD NO.:  [id number]          PATIENT TYPE:  INP   LOCATION:  3743                         FACILITY:  [facility]   PHYSICIAN:  Madolyn Frieze. Jens Som, MD, FACCDATE OF BIRTH:  [birth year]   DATE OF ADMISSION:  10/14/2006  DATE OF DISCHARGE:  10/18/2006                               DISCHARGE SUMMARY   PRIMARY CARDIOLOGIST:  Dr. Jens Som.   PRIMARY CARE PHYSICIAN:  She does not have one.   DISCHARGE DIAGNOSIS:  Acute on chronic systolic congestive heart  failure.   SECONDARY DIAGNOSES:  1. Nonischemic cardiomyopathy, ejection fraction approximately 25%.  2. Nonobstructive coronary artery disease.  3. Medication nonadherence.  4. Hypertension.  5. Hyperlipidemia.  6. Polysubstance abuse, including tobacco, marijuana, cocaine.  7. History of uterine fibroids.  8. Status post right oophorectomy, secondary to tubal pregnancy.  9. Moderate to severe aortic insufficiency and moderate to severe      mitral regurgitation.   ALLERGIES:  PENICILLIN.   PROCEDURE:  Right and left heart cardiac catheterization and 2D  echocardiogram.   HISTORY OF PRESENT ILLNESS:  A 50 year old [place] American female who  was in her usual state of health approximately 1 month prior to  admission when she began to experience progressively worsening dyspnea  on exertion, as well as weight gain and abdominal bloating.  She  presented to the [facility] ED on October 14, 2006 with worsening shortness  of breath and was noted to be markedly volume overloaded with a chest x-  ray confirming interstitial edema.  Her BNP was elevated at 1126 and she  was admitted for further evaluation and diuresis.   HOSPITAL COURSE:  [name] had elevation in her CK-MB and troponin-I  with normal CKs.  She was aggressively IV diuresed and a 2D  echocardiogram was performed on June 27, revealing, at least, mildly  depressed LV function (images were limited), along  with moderate to  severe aortic insufficiency and mitral regurgitation.  With continued  diuresis, she symptomatically improved.  A decision was made to perform  a right and left heart cardiac catheterization, which took place June  30, revealing diffuse, nonobstructive coronary artery disease with an EF  of 25% and global hypokinesis.  There was moderate 2-3+ aortic  insufficiency.  Right heart pressures were mildly elevated with a PA of  45/12 and a wedge of 15.  Her right atrial pressure was 4.  Cardiac  output was 2.9 with an index of 1.7.  She was initiated on beta-blocker  and ACE inhibitor therapy and also maintained on aspirin and statin.  She has been counseled on the importance of polysubstance cessation, as  well as medication adherence.  Dr. Jens Som plans to arrange for  followup at [facility] for amyloidosis evaluation as  it may be contributing to her nonischemic cardiomyopathy.  She is being  discharged home today in satisfactory condition.  We have, to the best  of our ability, tried to keep her medications as inexpensive as  possible.   DISCHARGE LABS:  Hemoglobin 13.0, hematocrit 38.9, WBC 3.7, platelets  221, MCV 95.4.  Sodium 137, potassium 4.4, chloride 103, CO2 23, BUN 12,  creatinine 1.0, glucose 89.  PT 15.0, INR 1.2, PTT 30.  Total bilirubin  1.3, alkaline phosphatase 57, AST 26, ALT 15, albumin 3.7.  CK 87, MB  5.5, troponin-I 0.11.  Total cholesterol 164, triglycerides 91, HDL 40,  LDL 106.  Calcium 8.7.  Beta hCG was negative.  BNP, on June 30, was  687.  TSH 2.489.   DISPOSITION:  Patient is being discharged home today in good condition.   FOLLOWUP PLANS AND APPOINTMENTS:  She has follow up with Dr. Ludwig Clarks  PA on July 16 at 3:45 p.m.  We have also arranged for a BMET on July 8  at 10 a.m.   DISCHARGE MEDICATIONS:  1. K-Dur 40 mEq daily.  2. Amlodipine 10 mg daily.  3. Aspirin 81 mg daily.  4. Lasix 40 mg 2 tabs daily.  5.  Coreg 12.5 mg b.i.d.  6. Enalapril 5 mg b.i.d.   OUTSTANDING LABS/STUDIES:  None.   DURATION OF DISCHARGE ENCOUNTER:  Sixty minutes including physician  time.      [name], ANP      Madolyn Frieze. Jens Som, MD, [facility]  Electronically Signed    CB/MEDQ  D:  10/18/2006  T:  10/18/2006  Job:  [id number]

## 2010-09-01 NOTE — H&P (Signed)
[name], [name]            ACCOUNT NO.:  [id number]   MEDICAL RECORD NO.:  [id number]          PATIENT TYPE:  INP   LOCATION:  1830                         FACILITY:  [facility]   PHYSICIAN:  Noralyn Pick. Eden Emms, MD, FACCDATE OF BIRTH:  09-14-1960   DATE OF ADMISSION:  10/14/2006  DATE OF DISCHARGE:                              HISTORY & PHYSICAL   PRIMARY CARDIOLOGIST:  Madolyn Frieze. Crenshaw, MD; however, he has not seen  her since 2006.   PRIMARY CARE PHYSICIAN:  She does not have.   SUMMARY OF HISTORY:  [name] is a 50 year old African American female  who presents to the [facility] emergency room with shortness of breath.   [name] describes at least a 50-month history of shortness of breath,  increasing dyspnea on exertion with decreased provocation, orthopnea,  and PND.  She also has gained approximately 15 pounds since 2006.  She  thinks she has gained weight in the last month but she is not sure how  much.  She has noticed abdominal swelling.  She presents in the  emergency room for progressive symptoms.  She also states that she has  not been taking her medications over the last month secondary to  finances.  She further explains that she has had to spend a considerable  amount of money on court fees secondary to some legal issues.  Despite  not being seen by Dr. Jens Som, his office has been continuing to fill  her medications.   PAST MEDICAL HISTORY:  Allergies include penicillin.   Medications:  Unknown doses prior to admission, are believed to include  Lasix, Norvasc, potassium and Toprol.   Past medical history is notable for hypertension for at least 10 years,  not optimally controlled.  He does that she also has a history of  noncompliance, uterine fibroids, right oophorectomy secondary to tubal  pregnancy.  She was hospitalized in 2005 with congestive heart failure.  An echocardiogram at that time showed EF of 30%, marked LVH, severe AI,  MR, left atrial  enlargement, RVH, moderate TR with a moderate  pericardial effusion.  Cardiac catheterization performed on October 19  confirmed pulmonary hypertension, and severe LVH, EF was 40%.  She had a  25% left renal stenosis 25% mid and distal LAD, 25% diagonal #1, 20-30%  distal circumflex 20% proximal OM-1, 40% proximal OM-2, 30% PDA.   SOCIAL HISTORY:  She resides in [place] with her boyfriend of many  years.  She does not have any children.  She remains unemployed.  Previously she was a Conservation officer, nature at [employer].  She has not smoked  cigarettes since her hospital admission in 2005.  She denies any  alcohol.  She does smoke pot on a daily basis.  She uses cocaine  approximately one time every 6 months and the last time she used was on  Monday.  She denies exercise or specific diet.  She does not check her  blood pressures at home.   FAMILY HISTORY:  Her mother died at the age of 49 after several  myocardial infarctions, history of hypertension.  Her father died in  his  69s with a ruptured esophagus.  She has two brothers living, one brother  deceased secondary to esophageal cancer.   REVIEW OF SYSTEMS:  In addition to the above, notable for a 3-day  history of cough, nonproductive.  Increased stress/depression with legal  issues, rare nausea.  All other systems are negative.  Her last period  was approximately 2 months ago.   PHYSICAL EXAMINATION:  GENERAL:  A well-nourished, well-developed,  [name]  VITAL SIGNS:  Blood pressure is initially 176/94, now is 156/94.  Pulse  was initially 100, now 81.  Respirations 24, now 18.  Saturation 98% on  room air.  HEENT:  Unremarkable.  NECK:  Supple without thyromegaly, adenopathy,.  She does have 8-10 cm  of JVD and a prominent V-wave.  CHEST:  Symmetrical excursion.  Breath sounds were diminished.  She has  crackles in the base.  HEART:  PMI is diffuse.  Regular rate and rhythm.  Probable  S4.  She has  both systolic and diastolic murmurs.  All pulses are symmetrical and  intact.  I did not appreciate any abdominal or femoral bruits.  SKIN:  Integument is intact.  ABDOMEN:  Slightly distended, possible ascites.  Negative tenderness,  rebound, or organomegaly.  EXTREMITIES.  Lower Extremities showed trace edema.  Negative cyanosis,  clubbing.  MUSCULOSKELETAL:  Unremarkable.  NEUROLOGIC:  Unremarkable.   Chest x-ray shows CHF with interstitial edema, COPD, suspicious for  pulmonary arterial hypertension.  EKG from the ER is still pending at  the time of this dictation.  H&H is 13.4 and 39.2, normal indices,  platelets 244, WBC 4.14.  Sodium from a I-STAT was 139, potassium 3.4,  BUN 12, creatinine 1.0, glucose 109.  Point of care markers negative x1.  BNP was 1126.   IMPRESSION:  1. Hypertension.  2. Recurrent diastolic congestive heart failure secondary to      hypertension and valvular disease.  3. Noncompliance.  4. Hypokalemia.  5. Drug use.  6. History as noted above.   DISPOSITION:  Dr. Eden Emms reviewed the patient's history, spoke with and  examined the patient, and agrees with the above.  We will admit her and  resume her home medications with IV diuresis, potassium supplementation.  We will also order an echocardiogram since this has not been checked  since 1995 and arrange for a right and left heart catheterization Monday  to further evaluate her valvular disease.  Case management will also be  contacted to assist with medications and discharge needs.  She will also  need a primary care [name].      Joellyn Rued, PA-C      Noralyn Pick. Eden Emms, MD, [facility]  Electronically Signed    EW/MEDQ  D:  10/14/2006  T:  10/14/2006  Job:  [id number]   cc:   Madolyn Frieze. Jens Som, MD, [facility]

## 2010-09-01 NOTE — Assessment & Plan Note (Signed)
[facility]                            CARDIOLOGY OFFICE NOTE   Samantha, Bell                     MRN:          [id number]  DATE:12/14/2007                            DOB:          Aug 22, 1960    [name] is a 50 year old female who has a history of nonischemic  cardiomyopathy, moderate-to-severe AI, and moderate-to-severe mitral  regurgitation.  She has had previous pericardial effusion by  echocardiogram as well.  Workup previously has suggested the possibility  of amyloid.  We had previously scheduled her to see Dr. Regino Schultze at [facility], as well as Dr. Delene Ruffini for potential bone marrow biopsy.  However, she has not kept any of those appointments.  She also does not  follow up routinely.  I last saw her in the office on January 2006, and  hospital in June 2008.  She is also occasionally noncompliant with her  medications and has used substances in the past.  She has been seen in  the emergency room twice in the past several months secondary to  dyspnea.  She does have dyspnea on exertion, but there is no orthopnea  or PND and no pedal edema.  She denies any chest pain, palpitations, or  syncope.   Her medications include by her report,  1. Norvasc 5 mg p.o. daily.  2. Lasix 40 mg p.o. b.i.d.  3. Coreg 12.5 mg p.o. b.i.d.  4. Enalapril 5 mg p.o. b.i.d.  She is not sure of the doses of her      medications, but she thinks these are correct.   PHYSICAL EXAMINATION:  GENERAL:  Physical examination today shows a  blood pressure of 154/110 and a pulse is 84.  She weighs 161 pounds.  HEENT:  Normal.  NECK:  Supple with no bruits.  CHEST:  Clear.  CARDIOVASCULAR:  Regular rate and rhythm.  There is a 2-3/6 systolic  murmur at the left sternal border, as well as a diastolic murmur.  She  also has a 3/6 systolic murmur at the apex.  ABDOMEN:  No tenderness.  EXTREMITIES:  No edema.   Her electrocardiogram shows a sinus rhythm at a rate  of 81.  The axis is  normal.  There is inferolateral T-wave inversion.   DIAGNOSES:  1. 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  The patient states      she will be compliant with this.  2. Nonischemic cardiomyopathy - She will continue on her ACE      inhibitor, beta-blocker, and Lasix.  I will check her BMET today to      follow her potassium and renal function.  3. We will also repeat her echocardiogram.  4. Moderate-to-severe aortic and mitral regurgitation - We will repeat      her echocardiogram.  5. History of moderate pericardial effusion - She will have her      echocardiogram repeated.  6. Hypertension - Her blood pressure remains elevated.  I have asked      her to contact  us when she gets home.  We will increase her      medications depending on the doses of her present ones.  7. History of tobacco and drug use - I have discussed the importance      of avoiding this.  8. History of noncompliance - I had long discussions with her about      this today.  I have explained that we will not be able to care for      her if she does not follow our advice and return for followup      appointments.  I have also explained that if she does not follow      through with her care, then her life will be shortened.  She seems      to understand this, although I am not optimistic that she will      comply.  I will see her back in 4 weeks.     [name], MD, [facility]  Electronically Signed    BSC/MedQ  DD: 12/14/2007  DT: 12/15/2007  Job #: [id number]

## 2010-09-01 NOTE — Cardiovascular Report (Signed)
[name], [name]            ACCOUNT NO.:  [id number]   MEDICAL RECORD NO.:  [id number]          PATIENT TYPE:  INP   LOCATION:  3743                         FACILITY:  [facility]   PHYSICIAN:  Everardo Beals. Juanda Chance, MD, FACCDATE OF BIRTH:  Dec 08, 1960   DATE OF PROCEDURE:  10/17/2006  DATE OF DISCHARGE:                            CARDIAC CATHETERIZATION   CLINICAL HISTORY:  [name] is 50 years old and has a history of a  nonischemic cardiomyopathy, previous drug use and hypertension.  She had  been evaluated in the past with an echocardiogram which showed an  ejection fraction of about 30% and thick walls.  There was a suggestion  of possibility of amyloidosis based on the echocardiographic findings.  According to Dr. Jens Som, she was referred to [facility] but did not follow  up on that appointment.  She has not been seen by [name] in the last couple  of years but was admitted a few days ago with congestive heart failure.  She had an echocardiogram here which was technically difficult but  suggested moderately severe AI.  She was scheduled for a catheterization  for evaluation of her heart failure to decide about plans for further  treatment.   PROCEDURE:  Right heart catheterization was performed percutaneously via  the right femoral vein using a venous sheath and Swan-Ganz __________  catheter. Left heart catheterization was performed percutaneously via  the right femoral arteries and arterial sheath and 5-French preformed  coronary catheter.  A __________  Omnipaque contrast was used. An aortic  root injection was performed to evaluate the patient's aortic  insufficiency.  The patient tolerated the laboratory well and left the  laboratory in satisfactory condition.   RESULTS:  LEFT MAIN CORONARY:  The left main coronary artery was free of  disease.   LEFT ANTERIOR DESCENDING ARTERY:  The left anterior descending artery  gave rise to diagonal branch and two septal perforators. The LAD  was  irregular and there was 30 proximal and 30% mid narrowing.   CIRCUMFLEX ARTERY:  The circumflex artery gave rise to a marginal branch  and atrial branch and two posterolateral branches.  There was 40% ostial  stenosis in the circumflex artery and 40% distal stenosis.  There was  50% narrowing in the first marginal branch.   RIGHT CORONARY ARTERY:  The right coronary was a moderate-sized vessel  and gave rise to a long posterior descending branch and a posterolateral  branch.  There was 40% narrowing in the distal right coronary artery.   LEFT VENTRICULOGRAM:  Left ventriculogram performed in RAO projection  showed global hypokinesis with an estimated ejection fraction of 25%.  There were PVCs so mitral regurgitation was difficult to assess.   AORTIC ROOT INJECTION:  Aortic root injection showed a mildly dilated  aortic root and moderate or 2-3+ aortic insufficiency.   HEMODYNAMIC DATA:  The right atrial pressure was 4 mean. The pulmonary  artery pressure was 45/12 with a mean of 30.  The pulmonary wedge  pressure was 15. The left ventricular pressure was 129/32.  The aortic  pressure was 129/74 with a mean of  94.  Cardiac output/cardiac index was  2.9/1.7 liters/minutes/m2 by Fick and 3.5/2.0 liters/minutes/m2 by.  __________ .   CONCLUSION:  1. Severe nonischemic cardiomyopathy with ejection fraction of 25%      possibly related to hypertensive disease.  2. Moderate aortic insufficiency.  3. Nonobstructive coronary artery disease with 30% proximal and 30%      mid stenosis in the LAD, 40% ostial and 40% distal stenosis in the      circumflex artery with 50% narrowing in a marginal branch, and 40%      narrowing in the distal right coronary artery.   RECOMMENDATIONS:  The patient has a severe nonischemic cardiomyopathy  which appears worse than on the previous studies. Since the left  ventricular walls have been shown to be quite thick, this may be related  to hypertensive  disease. MRI  was considered but I do not think this was  definitively ruled out. Her valvular disease did not appear to be the  primary problem although may contribute some to her congestive heart  failure. Will plan continued medical therapy. Will increase her Coreg  and add an ACE inhibitor and add a statin. Will probably keep her until  tomorrow with plans for discharge tomorrow.      Bruce Elvera Lennox Juanda Chance, MD, [facility]  Electronically Signed     BRB/MEDQ  D:  10/17/2006  T:  10/17/2006  Job:  [id number]   cc:   Madolyn Frieze. Jens Som, MD, [facility]  Cardiopulmonary Lab

## 2010-09-04 ENCOUNTER — Ambulatory Visit: Payer: Self-pay | Admitting: Gynecology

## 2010-09-04 NOTE — Discharge Summary (Signed)
[name], [name]            ACCOUNT NO.:  [id number]   MEDICAL RECORD NO.:  [id number]          PATIENT TYPE:  INP   LOCATION:  2034                         FACILITY:  [facility]   PHYSICIAN:  Jackie Plum, M.D.DATE OF BIRTH:  1961-03-25   DATE OF ADMISSION:  02/02/2004  DATE OF DISCHARGE:  02/07/2004                                 DISCHARGE SUMMARY   DISCHARGE DIAGNOSES:  Congestive heart failure exacerbation, resolved.   PROCEDURES:  A 2D echo done on February 03, 2004, indicated severe LVH,  significant LV systolic dysfunction, and diastolic dysfunction, significant  mitral regurgitation, tricuspid regurgitation, aortic insufficiency,  moderate pericardial effusion.  Cardiac catheterization done by Dr. Daisey Must on February 05, 2004,  indicated elevated right and left heart filling pressures with significant  pulmonary hypertension.  No evidence no renal artery stenosis.  Moderately-  decreased left ventricular systolic function with severe left ventricular  hypertrophy, nonobstructive coronary artery disease.   On February 04, 2004, the patient had electrophoresis which showed a total  protein of 6.4, albumin low, which was mild.  She has alpha-1 globulin high  at 7.0, alpha-2 globulin high at 13.1, beta globulin 6.9, beta-2 globulin  6.1, gamma globulin 13.7.  Interpretation: Deemed nonspecific pattern which  is seen in association with acute inflammatory response pattern.   On the day of discharge, angiotensin-converting enzyme was taken.  Results  were still pending at the time of discharge.  HDL cholesterol was 31, LDL  cholesterol 100, triglycerides 76, sodium 138, potassium 4.6, chloride 102,  CO2 26, glucose 101, BUN 19, creatinine 1.1, calcium 8.3.  WBC count 5.7,  hemoglobin 12.8, hematocrit 37.2, MCV 89.3, platelet count 285,000.   DISCHARGE MEDICATIONS:  1.  Mavik 4 mg daily.  2.  Imdur 30 mg daily.  3.  Lasix 40 mg two tablets daily.  4.  K-Dur 20 mEq  daily.  5.  Lopressor 50 mg two tablets twice daily.  6.  Aspirin 81 mg daily.   ACTIVITY:  As tolerated.   DIET:  Cardiac diet.   SPECIAL INSTRUCTIONS:  The patient was instructed to report to the doctor if  she had persistent problems, including chest pain, shortness of breath, or  dizziness.  Follow up at [facility] on March 06, 2004, at 3:30  p.m. to see Dr. Jens Som.  She is to see a Shyane Fossum at [facility] in two to  three weeks.   CONSULTANTS:  [facility].   PROCEDURES:  Cardiac catheterization, as noted.   CONDITION ON DISCHARGE:  Improved/satisfactory.   HISTORY OF PRESENT ILLNESS:  The patient is a 50 year old African-American  lady who presented with severe hypertension and congestive heart failure.  On admission, the patient was noted to have highly elevated BNP of more than  1000.  She was therefore admitted to the hospital for CHF in view of her BNP  findings as well as x-ray findings indicative of a pneumonic process.  Hospital course was notable for aggressive diuresis with repletion of her  potassium, as well as addition of ACE inhibitor to her medication regimen.  Serial cardiac enzymes ruled out a myocardial  infarction.  She had a very  abnormal echocardiogram which was concerning for cardiomyopathy, probably of  amyloid etiology.  The patient had a follow-up cardiac catheterization,  which is noted above.  Prior to discharge, fat pad biopsy was obtained.  The  results are pending, which will need to be followed as an outpatient, as  well as angiotensin-converting enzyme level to rule in or rule out cardiac  amyloidosis.  The patient's symptoms had improved.  She is on an ACE  inhibitor, beta blocker, and aspirin.  She has been doing well and deemed  appropriate for discharge with outpatient followup.  On the day of  discharge, the patient's blood pressure was 149/85.  She will need constant  followup on her blood pressures and adjustments in  her medications to be  sure that she reaches well-controlled blood pressure levels.   In addition, the patient had a CT of the abdomen and pelvis on February 06, 2004, on account of concerns for abdominal mass on exam, which was  incidental, and CT scan revealed pericardial effusion, mediastinal  adenopathy and abdominal viscera noted unremarkable.  She had findings in  the pelvis which were concerning with markedly enlarged fibroid uterus.  The  patient has a history of fibromyomata.  She will need followup in this  regard as well.   She is discharged in a stable and satisfactory condition.      GO/MEDQ  D:  07/18/2004  T:  07/18/2004  Job:  [id number]

## 2010-09-04 NOTE — Cardiovascular Report (Signed)
NAMEKALI, DEADWYLER NO.:  [id number]   MEDICAL RECORD NO.:  [id number]          PATIENT TYPE:  INP   LOCATION:  2034                         FACILITY:  [facility]   PHYSICIAN:  Carole Binning, M.D. LHCDATE OF BIRTH:  July 15, 1960   DATE OF PROCEDURE:  02/05/2004  DATE OF DISCHARGE:                              CARDIAC CATHETERIZATION   PROCEDURE PERFORMED:  Right and left heart catheterization, with coronary  angiography, left ventriculography, and abdominal aortography.   INDICATIONS:  [name] is a 50 year old woman who presented with severe  hypertension and congestive heart failure.  Evaluation has shown  cardiomyopathy with severe left ventricular hypertrophy and decreased left  ventricular systolic function.  After diuresis and stabilization on medical  therapy, she was referred for right and left heart catheterization to assess  her hemodynamics and coronary anatomy.   PROCEDURAL NOTE:  A 7 French sheath was placed in the right femoral vein, 6  French sheath in the right femoral artery.  We initially had some difficulty  advancing her Swan-Ganz catheter retrograde through the iliac vein into the  inferior vena cava.  We performed a venogram which showed that there was  patency of the iliac vein.  However, there was the appearance of possible  chronic venous thrombosis of the proximal iliac vein/inferior vena cava.  There was a channel, and we were able to manipulate our Swan-Ganz catheter  through this and advance it into the right heart to perform a right heart  catheterization.  We obtained sequential pressure measurements throughout  the right heart chambers.  We then obtained simultaneous left ventricular  and right ventricular pressure measurements.  Left ventriculography was  performed with an angled pigtail catheter.  Abdominal aortography was  performed with an angled pigtail catheter.  Coronary arteriography was  performed with standard Judkins 6  French catheters.  Contrast was Omnipaque.  There were no complications.   RESULTS:  Hemodynamics:  Right atrial mean pressure 11, right ventricular  pressure 58/11, pulmonary artery pressure 62/30, pulmonary capillary wedge  mean pressure 28, with a V wave of 38, left ventricular pressure 175/20,  aortic pressure 175/100.  There is no aortic valve gradient.   Simultaneous measurements of the left and right ventricular pressures reveal  concordance of left ventricular and right ventricular pressures.  The end-  diastolic pressures of the left ventricle and right ventricle did differ.   Cardiac output by the thermodilution method is 3.2, cardiac index 1.9.  Cardiac output by the Fick method is 2.2, cardiac index 1.3.  The pulmonary  artery saturation was 45%.   Left ventriculogram:  There is severe left ventricular enlargement and left  ventricular hypertrophy, with moderate global hypokinesis.  Ejection  fraction is estimated at 40%, but difficult to accurately quantitate due to  ventricular ectopy during the ventriculogram.   Abdominal aortogram reveals a 25% stenosis in the left renal artery.  The  right renal artery is normal.  There is minimal atherosclerotic disease of  the abdominal aorta and iliac arteries.  There do appear to be possible  multiple venous collaterals, particularly in the right pelvic  region.   Coronary arteriography (right dominant):   Left main is normal.   Left anterior descending artery has diffuse 25% stenosis in the mid to  distal vessel.  The LAD gives rise to a single large diagonal branch which  also has a diffuse 25% stenosis.   Left circumflex is a large vessel.  In the distal circumflex, there is a  diffuse 20-30% stenosis.  Circumflex gives rise to a normal size first  obtuse marginal with a 20% stenosis proximally.  There is a small second  obtuse marginal with less than 40% stenosis proximally.  There is a large  third obtuse marginal  branch.   Right coronary artery gives rise to a very large posterior descending artery  and a small posterolateral branch.  The posterior descending artery has a  30% stenosis in the midportion.   IMPRESSIONS:  1.  Elevated right and left heart filling pressures, with significant      pulmonary hypertension.  2.  No evidence of renal artery stenosis by abdominal aortography.  3.  Moderately decreased left ventricular systolic function, with severe      left ventricular hypertrophy.  4.  Nonobstructive coronary artery disease.  5.  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   RECOMMENDATIONS:  Continue medical therapy and evaluation for the etiologies  of the patient's cardiomyopathy per Dr. Jens Som.       MWP/MEDQ  D:  02/05/2004  T:  02/05/2004  Job:  [id number]   cc:   Hollice Espy, M.D.   Olga Millers, M.D. [facility]   Cardiac Catheterization Laboratory

## 2010-09-04 NOTE — H&P (Signed)
[name], [name]            ACCOUNT NO.:  [id number]   MEDICAL RECORD NO.:  [id number]          PATIENT TYPE:  INP   LOCATION:  2034                         FACILITY:  [facility]   PHYSICIAN:  Hollice Espy, M.D.DATE OF BIRTH:  03/11/1961   DATE OF ADMISSION:  02/02/2004  DATE OF DISCHARGE:                                HISTORY & PHYSICAL   CONSULTATIONS ON THIS CASE:  [facility].   CHIEF COMPLAINT:  Shortness of breath and found to be in new onset  congestive heart failure.   The patient is a 50 year old [place] American female with a past medical  history of hypertension for the past seven years. She has been relatively  noncompliant, has not seen a doctor in many years, and has not taken blood  pressure medication for many years. She was telling the ER nurse that  whenever she has gone to the pharmacy to have her blood pressure checked it  has always been quite high. She has felt fine and therefore did not follow  this up. For the past week or so she has been complaining of increasing  shortness of breath as well as nonproductive cough. She was concerned she  was coming down with a cold and she has tried multiple cold and flu  medications to no avail. In addition, she has noticed that she has had  increased problems with sleeping associated with lying on her back. She came  into the emergency room and was evaluated. Her chest x-ray showed evidence  of mild cardiomegaly as well as chronic lung changes. Most concerning,  however, was her BNP which was elevated in the 1300s. The patient was given  IV Lasix 40 mg and diuresed quite a bit. She immediately told me that her  breathing felt better. She has had no problems with chest pain or  palpitations. She has not had any problems with chest heaviness or pressure  as well. She denies any headaches, visual changes, or dysphagia. She denies  any chest pain or palpitations. She denies any wheezing. She does complain  of  a nonproductive cough as well as shortness of breath which has improved.  She denies any abdominal pain, hematuria, dysuria, constipation, or  diarrhea.   PAST MEDICAL HISTORY:  Hypertension as well as a uterine fibroids.   MEDICATIONS:  She was on a blood pressure medication, but she has not taken  it for a long time.   ALLERGIES:  PENICILLIN.   SOCIAL HISTORY:  The patient smoked approximately one pack which lasted for  two to three weeks; however, she has been smoking for 20 years. In addition  she smokes marijuana every other day. She denies any other type of drug use.  Over-the-counter medications, she takes nothing regularly, although she has  been recently taking some cold and flu medications. She denies any alcohol  use at all.   FAMILY HISTORY:  Positive for heart disease and blood pressure.   PHYSICAL EXAMINATION:  VITAL SIGNS: Temperature 97.3, heart rate 101, blood  pressure 191/110, respirations 16, O2 saturation 98% on room air.  GENERAL: She is alert and oriented  times three, in no apparent distress.  HEENT: Normocephalic and atraumatic. Mucous membranes are moist. She has no  carotid bruits.  HEART: Regular rate and rhythm with a harsh sounding holosystolic murmur  best heard over the right sternal border.  LUNGS: Decreased breath sounds bibasilarly.  ABDOMEN: Soft, nontender, nondistended. Positive bowel sounds.  EXTREMITIES: No clubbing, cyanosis. She has 1+ pitting edema.   LABORATORY DATA:  The patient's chest x-ray shows evidence of some chronic  lung disease as well as a large heart. No overt failure.   Total protein 5.9, albumin 3.1. The rest of the LFTs are within normal  limits. BNP is elevated at 2841. Creatinine 1, sodium 130, potassium 3.3,  chloride 103, bicarbonate 14, glucose 162. TSH 7.38. PCO2 42, bicarbonate  25. H&H 14.6/43. CPK-MB is 550, 4.1, and 7 with troponin-I of less than  0.05.   The patient's EKG is not in front of me, but reportedly  was normal sinus  rhythm with evidence of LVH.   ASSESSMENT/PLAN:  1.  Congestive heart failure exacerbation most likely secondary to medical      noncompliance. Will check an echo, diurese her, and follow her labs      including a TSH, start an ACE inhibitor, watch her BNP and BMET.  2.  Hypertension. Mavik and Lasix have been initiated. Add nitrate if blood      pressure resistant.      Send   SKK/MEDQ  D:  02/02/2004  T:  02/02/2004  Job:  [id number]

## 2010-09-04 NOTE — Consult Note (Signed)
Bell, Samantha NO.:  [id number]   MEDICAL RECORD NO.:  [id number]          PATIENT TYPE:  INP   LOCATION:  2034                         FACILITY:  [facility]   PHYSICIAN:  Sandria Bales. Ezzard Standing, M.D.  DATE OF BIRTH:  [birth year]   DATE OF CONSULTATION:  02/04/2004  DATE OF DISCHARGE:                                   CONSULTATION   CONSULTING PHYSICIAN:  Sandria Bales. Ezzard Standing, M.D.   REFERRING PHYSICIAN:  Hollice Espy, M.D.   REASON FOR CONSULTATION:  Need for biopsy for a diagnosis for ruling out  amyloid.   HISTORY OF PRESENT ILLNESS:  The patient is a 50 year old black female who  has had hypertension, has been non-compliant and has not really actively  followed her physician's advice.   She presented to [facility] on February 02, 2004 with congestive  heart failure felt to be secondary to medical non-compliance.   ALLERGIES:  She has no allergies.   MEDICATIONS:  1.  She was on blood pressure medication, but has not taken that for some      time.   REVIEW OF SYMPTOMS:  NEUROLOGIC:  Significant in that she does have  headaches occasionally and dizziness.  No seizure or loss of consciousness.  PULMONARY:  She has shortness of breath.  She smokes cigarettes and knows  this is bad for her health.  CARDIOVASCULAR:  She does have hypertension.  GASTROINTESTINAL:  She has occasional nausea and some hemorrhoids. UROLOGIC:  She has had no kidney stones or kidney infections.   PHYSICAL EXAMINATION:  GENERAL:  She is a thin black female, alert and  cooperative on physical examination.  ABDOMEN:  Soft and non-tender.   LABORATORY DATA:  Hemoglobin 14.6 and hematocrit 43.0.  Total protein is 5.9  and albumin is 3.1.  Her liver function tests were all within normal limits.   IMPRESSION:  Congestive heart failure with evaluation per Dr. Jens Som of  [facility] who has requested that I do a biopsy for diagnosing  amyloid.  I spoke with Dr. Jens Som on  the phone, we talked about a needle  aspiration of her abdominal fat pad for diagnosing amyloid.  However, I  spoke with Dr. Kieth Brightly of pathology and she said the aspiration of the  abdominal fat pad is popular, but actually a higher yield is obtained by  doing an open biopsy of the fat pad of the abdominal wall.   PLAN:  I will schedule this with the patient at a time convenient with her.  She is scheduled for a cardiac cath tomorrow.  I have talked about the  procedure and  this biopsy can be done under local anesthesia in the minor  surgery room at [facility].      Davi   DHN/MEDQ  D:  02/04/2004  T:  02/04/2004  Job:  [id number]   cc:   Hollice Espy, M.D.   Olga Millers, M.D. [facility]

## 2010-09-04 NOTE — Op Note (Signed)
NAMEAJWA, Samantha NO.:  [id number]   MEDICAL RECORD NO.:  [id number]          PATIENT TYPE:  INP   LOCATION:  2034                         FACILITY:  [facility]   PHYSICIAN:  Sandria Bales. Ezzard Standing, M.D.  DATE OF BIRTH:  January 19, 1961   DATE OF PROCEDURE:  02/05/2004  DATE OF DISCHARGE:                                 OPERATIVE REPORT   PREOPERATIVE DIAGNOSIS:  Rule out amyloidosis.   POSTOPERATIVE DIAGNOSIS:  Rule out amyloidosis.   PROCEDURE:  Biopsy of abdominal wall fat pad.   SURGEON:  Sandria Bales. Ezzard Standing, M.D.   ANESTHESIA:  6 mL of 1% Xylocaine with epinephrine.   COMPLICATIONS:  None.   INDICATIONS FOR PROCEDURE:  The patient is a 50 year old black female being  evaluated by [facility] and Olga Millers, M.D. [facility] for congestive  heart failure of underlying causes.  He has asked for a biopsy for amyloid  and the patient comes for excisional biopsy of the abdominal wall.   DESCRIPTION OF PROCEDURE:  With the patient in the supine position, her  right abdominal wall was prepped with Betadine solution and sterilely  draped.  I made an approximately 2 cm incision, excised a block of abdominal  fat pad about 1.5 cmsq and removed this and sent it to pathology in  formalin.  I spoke to Dr. Luiz Ochoa about processing the path beforehand.  I  then controlled the bleeding with 5-0 Vicryl suture.  I closed the skin with  a running subcuticular 5-0 Vicryl suture.  I painted the wound the tincture  Benzoin and Steri-Stripped it.   The patient tolerated the procedure well and will go back to her room.  She  is actually for a heart catheterization this afternoon.      Pervis Hocking   DHN/MEDQ  D:  02/05/2004  T:  02/05/2004  Job:  [id number]   cc:   Olga Millers, M.D. [facility]   Hollice Espy, M.D.

## 2010-09-04 NOTE — Consult Note (Signed)
NAMEJAVERIA, Bell NO.:  [id number]   MEDICAL RECORD NO.:  [id number]          PATIENT TYPE:  INP   LOCATION:  2034                         FACILITY:  [facility]   PHYSICIAN:  Joellyn Rued, P.A. [facility] DATE OF BIRTH:  [birth year]   DATE OF CONSULTATION:  02/04/2004  DATE OF DISCHARGE:                                   CONSULTATION   HISTORY OF PRESENT ILLNESS:  [name] is a 50 year old African American  female who presented to the emergency room on Sunday complaining of  shortness of breath.  She describes a several year history of shortness of  breath particularly associated with very heavy exertion and emotional upset.  However, over the last 4-6 months, she has noticed increased shortness of  breath with talking, eating, light household activities, and even just  walking to the car.  The symptoms are relieved with rest only to return with  any activity.  She has also described some orthopnea for months which has  increased over the last month, PND edema which does not decrease at night,  and a weight gain of uncertain amount.  When she has shortness of breath,  occasionally she does have some nausea and has diaphoresis, but she does not  have any chest discomfort.  She has also had a cough with no production.  In  the last week or so, she has had some sinus congestion with chills and a  probable fever, although she has never checked her temperature.  She has  been taking Tylenol PM, Tylenol cough and cold, and Tylenol flu without  relief.  She also describes an occasional skipped beat, but she has not had  any slow or fast palpitations.  Since admission, she states that her  breathing is much better.  She continues to have a cough and some shortness  of breath and her pedal edema has decreased.  Her output has been over 3000  mL.   PAST MEDICAL HISTORY:  Notable for penicillin allergy.  She was not taking  any prescription medications prior to admission.  She has  a history of  hypertension for at least seven years.  She has been noncompliant with  prescriptions in the past or physician follow up.  She has a long history of  uterine fibroids, her last CT was in 2001 and she has elected not to undergo  surgery because she did not want a hysterectomy.  She has also had a tubal  pregnancy in the past which required surgery, having her right ovary and  both tubes removed.  She was told in high school that she has a heart murmur  and was allowed to participate in sports and did not have any problems.  She  denies any history of diabetes, myocardial infarction, cerebrovascular  accident, COPD, bleeding dyscrasias, thyroid dysfunction, rheumatic fever,  or weight loss medications.   SOCIAL HISTORY:  She resides in [place] with her boyfriend who is a long  distance Naval architect.  She is currently unemployed.  Prior, she was a  Conservation officer, nature at [employer].  She is divorced without children.  She  smokes  approximately one pack cigarettes per day for 20 years and denies any  alcohol.  She smokes marajuana every other day.  Her last cocaine use was  several months ago.  She denies any diet or regular exercise.   FAMILY HISTORY:  Notable for the death of her mother at the age of 5 after  several myocardial infarctions and a history of hypertension.  She states  that her father is deceased in his 54s.  She stated that his esophagus  ruptured and he drowned in his own blood.  She has two brother who are alive  and well.  One brother is deceased with esophageal cancer at the age of 71.   REVIEW OF SYMPTOMS:  Notable for chills, weight changes, headaches  associated with the Imdur.  Her last dental visit was in 1999.  Occasional  nocturia, weakness, and depression.   PHYSICAL EXAMINATION:  GENERAL:  Well developed, well nourished, pleasant African American female  in no apparent distress.  VITAL SIGNS:  Temperature 97.3, pulse 86, respirations 22, blood  pressure  160/90, admission weight 137, saturation 94% on room air.  HEENT:  Essentially unremarkable.  NECK:  She does have some JVD at 45 degrees.  HEART:  Regular rate and rhythm.  Normal S1 and S2.  She has three distinct  murmurs.  She has a systolic murmur at the apex and also on the upper left  and right sternal border.  She also has a diastolic murmur.  LUNGS:  Diminished breath sounds.  She has some expiratory wheezing in the  right posterior lobe and in the anterior upper lobe.  SKIN/INTEGUMENT:  Intact.  ABDOMEN:  Bowel sounds present without organomegaly.  She does have a  softball size mass just right of the umbilicus that is nontender and  circumscribed.  EXTREMITIES:  No cyanosis or clubbing.  She does have 1+ pedal edema.  All  peripheral pulses are symmetrical and intact without femoral or abdominal  bruits.  NEUROLOGICAL:  Alert and oriented x 3, cranial nerves 2-12 are grossly  intact.   CURRENT MEDICATIONS:  Potassium 40 mEq daily, Mavik 2 mg daily, Imdur 30 mg  daily, Lasix 80 IV b.i.d., Lopressor 50 b.i.d., and Combivent.   LABORATORY DATA:  Chest x-ray shows cardiomegaly and chronic lung changes.  EKG is not on the chart.  H&H on admission was 14.6 and 43, there was no  indices or differential.  Sodium 138, potassium 3.3, BUN 14, creatinine 1,  glucose 162, normal LFTs.  TSH 1.361.  Total protein and albumin were also  diminished.   IMPRESSION:  1.  Congestive heart failure with echocardiogram performed on October 17      showing EF 30%, LVH and RVH, moderate to severe AI, MR, and TR, with a      moderate pericardial effusion.  BNP was elevated but has improved.  2.  Hypertension uncontrolled.  3.  Dyslipidemia.  4.  Hypokalemia which has resolved.  5.  Hyperglycemia on admission.  6.  Tobacco and drug use.   DISPOSITION:  Dr. Jens Som saw the patient, reviewed the patient's history, spoke with, and examined the patient and agrees with above.  Dr.  Jens Som  recommends discontinuing Imdur, increasing her Mavik to 4 mg everyday, and  continuing her Lopressor and beta blocker to treat her CHF symptoms and  improve her blood pressure.  With the findings of the echocardiogram, we  need to rule out various differentials.  Will check a serum  PEP, urine PEP,  as well as a serum free light chain assay.  Consult a surgeon for a fat pad  biopsy.  Ferritin levels to rule out amyloid for infiltrative  cardiomyopathy.  Will also check HIV and have an abdominal CT performed to  evaluate her mass on exam which is probable uterine fibroids since she has a  history of this.  Will also perform a right and left heart catheterization  that has been arranged for October 19 to exclude coronary artery disease and  restrictive cardiomyopathy.  It is possible her cardiomyopathy is all due to  uncontrolled hypertension.  Once her CHF improves, she will need a follow up  echocardiogram for valvular disease.       EW/MEDQ  D:  02/04/2004  T:  02/04/2004  Job:  [id number]

## 2010-09-28 ENCOUNTER — Other Ambulatory Visit: Payer: Self-pay | Admitting: Cardiology

## 2010-10-02 ENCOUNTER — Ambulatory Visit: Payer: Self-pay | Admitting: Gynecology

## 2010-10-05 ENCOUNTER — Other Ambulatory Visit: Payer: Self-pay

## 2010-10-05 ENCOUNTER — Ambulatory Visit: Payer: Self-pay | Admitting: Gynecology

## 2010-11-02 ENCOUNTER — Ambulatory Visit: Payer: Self-pay | Admitting: Cardiology

## 2010-11-16 ENCOUNTER — Encounter: Payer: Self-pay | Admitting: Cardiology

## 2010-11-16 ENCOUNTER — Ambulatory Visit (INDEPENDENT_AMBULATORY_CARE_PROVIDER_SITE_OTHER): Payer: Self-pay | Admitting: Cardiology

## 2010-11-16 VITALS — BP 122/72 | HR 73 | Ht 65.0 in | Wt 160.0 lb

## 2010-11-16 DIAGNOSIS — D259 Leiomyoma of uterus, unspecified: Secondary | ICD-10-CM

## 2010-11-16 DIAGNOSIS — I38 Endocarditis, valve unspecified: Secondary | ICD-10-CM

## 2010-11-16 DIAGNOSIS — I5042 Chronic combined systolic (congestive) and diastolic (congestive) heart failure: Secondary | ICD-10-CM

## 2010-11-16 DIAGNOSIS — I10 Essential (primary) hypertension: Secondary | ICD-10-CM

## 2010-11-16 LAB — BASIC METABOLIC PANEL
GFR: 111.92 mL/min (ref >60.00)
Glucose, Bld: 90 mg/dL (ref 70–99)
Potassium: 3.3 mEq/L — ABNORMAL LOW (ref 3.5–5.1)
Sodium: 143 mEq/L (ref 135–145)

## 2010-11-16 MED ORDER — CARVEDILOL 25 MG PO TABS: 25.0000 mg | ORAL_TABLET | Freq: Two times a day (BID) | ORAL | Status: DC

## 2010-11-16 NOTE — Assessment & Plan Note (Signed)
She will need followup echoes in the future and possible valve replacement.

## 2010-11-16 NOTE — Assessment & Plan Note (Signed)
Continue ACE inhibitor and beta blocker.LV function mildly reduced.

## 2010-11-16 NOTE — Patient Instructions (Signed)
Your physician recommends that you schedule a follow-up appointment in: 3 MONTHS  INCREASE CARVEDILOL TO 25 MG TWICE DAILY

## 2010-11-16 NOTE — Assessment & Plan Note (Signed)
I have asked her to followup with her primary care and GYN for this issue.

## 2010-11-16 NOTE — Progress Notes (Signed)
HPI:[name] is a pleasant  female who returns for followup today.  The patient has a long history of severe hypertension and a cardiomyopathy. Cardiac catheterization in June of 2008 showed ejection fraction of 25% and nonobstructive coronary disease. There was moderate aortic insufficiency. Her previous workup in 2005 suggested the possibility of amyloid.  At that time, she had a normal ferritin level, normal ACE level, normal TSH, and negative HIV.  A fat pad biopsy to rule out amyloidosis was negative.  However, we also scheduled her to have free kappa light chains which were elevated at 4.16 and her lambda free light chains were 0.97 which was normal.  However, her kappa to lambda free light chain ratio was elevated at 4.29.  Last echocardiogram in Feb 2012 revealed an ejection fraction of 45-50%.  Left ventricular wall thickness was markedly increased. There was moderate aortic insufficiency/mild to moderate aortic stenosis. There was moderate mitral regurgitation/mild to moderate MS. The left atrium was moderately to markedly dilated; moderate AI and MR. Note there has been issues with followup as she has not returned for extended lengths of time. She was seen by hematology oncology in September of 2010 for question amyloidosis. Followup laboratories were to be obtained. However she did not followup. However when I saw her last week we arranged for her to be seen and apparently her workup for amyloid was negative. I last saw her in Feb 2012. Since then, the patient denies any dyspnea on exertion, orthopnea, PND, pedal edema, palpitations, syncope or chest pain.   Current Outpatient Prescriptions  Medication Sig Dispense Refill  . amLODipine (NORVASC) 10 MG tablet Take 10 mg by mouth daily.        . carvedilol (COREG) 12.5 MG tablet TAKE ONE AND ONE-HALF TABLETS BY MOUTH TWICE DAILY  90 tablet  6  . enalapril (VASOTEC) 20 MG tablet Take 20 mg by mouth 2 (two) times daily.        . furosemide (LASIX)  40 MG tablet Take two tablets twice daily  120 tablet  12  . potassium chloride (KLOR-CON) 20 MEQ packet Take 20 mEq by mouth 2 (two) times daily.  60 tablet  12     Past Medical History  Diagnosis Date  . CHF (congestive heart failure)   . Pulmonary nodule, left     f/u chest CT 2/12: interval clearing of RUL air space nodule, borderline enlarged mediastinal lymph nodes stable, pul. arterial HTN  . Emphysema   . Ovarian cyst     ovarian cystic mass  . Cocaine abuse   . Alcohol abuse   . Hypertension     pulmonary  . 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 Mitral stenosis   . Nonischemic cardiomyopathy     a. cath 6/08: pLAD 30%, mLAD 30%, oCFX 40%, dCVS 40%; OM 50%; dRCA 40%;    b. echo 06/01/10: severe LVH; EF 45-50%; inf HK; mild to mod AS (mean 30), mod. AI; mild to mod MS; mod MR; severe LAE, mod TR, mod PR, PASP 59;  c.  w/u for Amyloid done in past  . Tubal pregnancy     Hx  . Cannabis abuse   . Fibroids     abd. CT 2/12: large fibroid uterus  . Personal history of noncompliance with medical treatment, presenting hazards to health   . Pulmonary HTN     Past Surgical History  Procedure Date  . Tubal ligation     History  Social History  . Marital Status: Single    Spouse Name: N/A    Number of Children: N/A  . Years of Education: N/A   Occupational History  . Not on file.   Social History Main Topics  . Smoking status: Former Games developer  . Smokeless tobacco: Not on file  . Alcohol Use: Not on file  . Drug Use: Not on file  . Sexually Active: Not on file   Other Topics Concern  . Not on file   Social History Narrative   Single no childrenTobacco Use - Hx of x-20 yearsHistory Cocaine and Mariguana use    ROS: no fevers or chills, productive cough, hemoptysis, dysphasia, odynophagia, melena, hematochezia, dysuria, hematuria, rash, seizure activity, orthopnea, PND, pedal edema, claudication. Remaining systems are negative.  Physical  Exam: [name] well-nourished in no acute distress.  Skin is warm and dry.  HEENT is normal.  Neck is supple. No thyromegaly.  Chest is clear to auscultation with normal expansion.  Cardiovascular exam is regular rate and rhythm. 2/6 systolic and diastolic murmur left sternal border. Abdominal exam nontender or distended. No masses palpated. Extremities show no edema. neuro grossly intact  ECG sinus rhythm with occasional PVC. Lateral T-wave inversion. Prolonged QT.

## 2010-11-16 NOTE — Assessment & Plan Note (Signed)
Continue present dose of diuretic. Check potassium and renal function. 

## 2010-11-16 NOTE — Assessment & Plan Note (Signed)
Blood pressure controlled. Continue present medications. 

## 2010-11-17 ENCOUNTER — Telehealth: Payer: Self-pay | Admitting: *Deleted

## 2010-11-17 DIAGNOSIS — E876 Hypokalemia: Secondary | ICD-10-CM

## 2010-11-17 NOTE — Telephone Encounter (Signed)
Message copied by Freddi Starr on Tue Nov 17, 2010  4:37 PM ------      Message from: Lewayne Bunting      Created: Mon Nov 16, 2010  5:29 PM       Change kcl to 40 meq po daily; bmet one week.      Olga Millers

## 2010-11-17 NOTE — Telephone Encounter (Signed)
Pt states her potassium is low because she had missed two doses of meds. She will have labs repeated in one week [name]

## 2010-11-24 ENCOUNTER — Other Ambulatory Visit: Payer: Self-pay | Admitting: *Deleted

## 2010-11-27 ENCOUNTER — Other Ambulatory Visit: Payer: Self-pay | Admitting: Cardiology

## 2010-11-27 ENCOUNTER — Telehealth: Payer: Self-pay | Admitting: Cardiology

## 2010-11-27 NOTE — Telephone Encounter (Signed)
I called the patient and made her aware her RX was sent in to [facility] on 11/16/10 when she was here for her office visit. I have instructed her to check with her pharmacy to see if her RX is there and ready. She will call back if it is not available.

## 2010-11-27 NOTE — Telephone Encounter (Signed)
Pt has a question regarding her Coreg dose.  It was suppose to be increased and instead pres was called in and the dose was decreased.  Please update pres. At [facility] on [address]/ Pt is out of medication. Call pt when this is done.

## 2011-01-13 LAB — DIFFERENTIAL
Basophils Absolute: 0
Basophils Relative: 1
Lymphocytes Relative: 10 — ABNORMAL LOW
Monocytes Absolute: 0.7
Neutro Abs: 4.6
Neutrophils Relative %: 77

## 2011-01-13 LAB — CBC
HCT: 42.7
Platelets: 240
RDW: 13.7
WBC: 6

## 2011-01-13 LAB — POCT I-STAT, CHEM 8
BUN: 11
Calcium, Ion: 1.09 — ABNORMAL LOW
HCT: 48 — ABNORMAL HIGH
Sodium: 138
TCO2: 26

## 2011-01-13 LAB — B-NATRIURETIC PEPTIDE (CONVERTED LAB): Pro B Natriuretic peptide (BNP): 537 — ABNORMAL HIGH

## 2011-01-14 LAB — CBC
HCT: 43.9
Hemoglobin: 14.6
RBC: 4.52
RDW: 13.8
WBC: 5.6

## 2011-01-14 LAB — DIFFERENTIAL
Basophils Absolute: 0.1
Basophils Relative: 1
Eosinophils Absolute: 0.1
Lymphocytes Relative: 22
Monocytes Absolute: 0.4
Neutrophils Relative %: 69

## 2011-01-14 LAB — COMPREHENSIVE METABOLIC PANEL
ALT: 20
Alkaline Phosphatase: 73
BUN: 13
CO2: 20
Chloride: 107
GFR calc non Af Amer: >60
Glucose, Bld: 151 — ABNORMAL HIGH
Potassium: 4.1
Sodium: 138
Total Bilirubin: 1.2

## 2011-01-14 LAB — POCT CARDIAC MARKERS
Operator id: 261601
Troponin i, poc: <0.05

## 2011-01-14 LAB — B-NATRIURETIC PEPTIDE (CONVERTED LAB): Pro B Natriuretic peptide (BNP): 1720 — ABNORMAL HIGH

## 2011-01-27 ENCOUNTER — Ambulatory Visit: Payer: Self-pay | Admitting: Cardiology

## 2011-02-02 LAB — BASIC METABOLIC PANEL
BUN: 12
CO2: 23
Chloride: 103
Creatinine, Ser: 1
Potassium: 4.4

## 2011-02-02 LAB — CBC
HCT: 38.9
MCHC: 33.4
MCV: 95.4
RBC: 4.08
WBC: 3.7 — ABNORMAL LOW

## 2011-02-03 LAB — BASIC METABOLIC PANEL
CO2: 26
CO2: 26
Calcium: 8.6
GFR calc non Af Amer: >60
Glucose, Bld: 87
Glucose, Bld: 88
Potassium: 3.5
Potassium: 3.8
Potassium: 4.5
Sodium: 136
Sodium: 137
Sodium: 137

## 2011-02-03 LAB — PROTIME-INR: Prothrombin Time: 15

## 2011-02-03 LAB — DIFFERENTIAL
Basophils Absolute: 0
Eosinophils Absolute: 0.1
Lymphs Abs: 1.1
Monocytes Absolute: 0.5
Neutro Abs: 3.3

## 2011-02-03 LAB — POCT I-STAT 3, VENOUS BLOOD GAS (G3P V)
Bicarbonate: 23.5
Bicarbonate: 23.6
Operator id: 123251
TCO2: 25
TCO2: 25
pCO2, Ven: 25.2 — ABNORMAL LOW
pCO2, Ven: 43.1 — ABNORMAL LOW
pH, Ven: 7.346 — ABNORMAL HIGH
pH, Ven: 7.539 — ABNORMAL HIGH

## 2011-02-03 LAB — LIPID PANEL
Cholesterol: 164
HDL: 40

## 2011-02-03 LAB — CBC
HCT: 39.2
HCT: 40.5
Hemoglobin: 13.4
Hemoglobin: 13.7
MCHC: 33.8
MCHC: 34.1
MCV: 95.6
Platelets: 224
RDW: 14.3 — ABNORMAL HIGH
RDW: 14.7 — ABNORMAL HIGH

## 2011-02-03 LAB — I-STAT 8, (EC8 V) (CONVERTED LAB)
Acid-base deficit: 3 — ABNORMAL HIGH
Chloride: 106
HCT: 44
Hemoglobin: 15
Operator id: 288331
Potassium: 3.4 — ABNORMAL LOW
Sodium: 139
pCO2, Ven: 32.2 — ABNORMAL LOW

## 2011-02-03 LAB — TROPONIN I
Troponin I: 0.1 — ABNORMAL HIGH
Troponin I: 0.11 — ABNORMAL HIGH
Troponin I: 0.12 — ABNORMAL HIGH

## 2011-02-03 LAB — CK TOTAL AND CKMB (NOT AT ARMC)
CK, MB: 5.5 — ABNORMAL HIGH
CK, MB: 6.5 — ABNORMAL HIGH
Relative Index: INVALID
Relative Index: INVALID

## 2011-02-03 LAB — COMPREHENSIVE METABOLIC PANEL
ALT: 15
BUN: 12
CO2: 24
Calcium: 8.6
GFR calc non Af Amer: >60
Glucose, Bld: 130 — ABNORMAL HIGH
Sodium: 139

## 2011-02-03 LAB — I-STAT EC8
BUN: 18
Bicarbonate: 24.2 — ABNORMAL HIGH
Glucose, Bld: 99
pCO2 arterial: 40.6

## 2011-02-03 LAB — POCT I-STAT CREATININE: Creatinine, Ser: 1

## 2011-02-08 ENCOUNTER — Other Ambulatory Visit: Payer: Self-pay | Admitting: Cardiology

## 2011-03-19 ENCOUNTER — Ambulatory Visit: Payer: Self-pay | Admitting: Cardiology

## 2011-04-21 ENCOUNTER — Other Ambulatory Visit: Payer: Self-pay | Admitting: Cardiology

## 2011-04-21 ENCOUNTER — Ambulatory Visit: Payer: Self-pay | Admitting: Cardiology

## 2011-05-01 ENCOUNTER — Emergency Department (HOSPITAL_COMMUNITY): Payer: Self-pay

## 2011-05-01 ENCOUNTER — Emergency Department (HOSPITAL_COMMUNITY)
Admission: EM | Admit: 2011-05-01 | Discharge: 2011-05-02 | Disposition: A | Discharge status: Home / Self Care | Payer: Self-pay | Attending: Emergency Medicine | Admitting: Emergency Medicine

## 2011-05-01 ENCOUNTER — Encounter (HOSPITAL_COMMUNITY): Payer: Self-pay | Admitting: *Deleted

## 2011-05-01 DIAGNOSIS — I509 Heart failure, unspecified: Secondary | ICD-10-CM | POA: Insufficient documentation

## 2011-05-01 DIAGNOSIS — I10 Essential (primary) hypertension: Secondary | ICD-10-CM | POA: Insufficient documentation

## 2011-05-01 DIAGNOSIS — J438 Other emphysema: Secondary | ICD-10-CM | POA: Insufficient documentation

## 2011-05-01 DIAGNOSIS — R439 Unspecified disturbances of smell and taste: Secondary | ICD-10-CM | POA: Insufficient documentation

## 2011-05-01 DIAGNOSIS — I428 Other cardiomyopathies: Secondary | ICD-10-CM | POA: Insufficient documentation

## 2011-05-01 DIAGNOSIS — R4789 Other speech disturbances: Secondary | ICD-10-CM | POA: Insufficient documentation

## 2011-05-01 DIAGNOSIS — R4182 Altered mental status, unspecified: Secondary | ICD-10-CM | POA: Insufficient documentation

## 2011-05-01 NOTE — ED Notes (Signed)
EDP Preston Fleeting present evaluating this pt - family present

## 2011-05-01 NOTE — ED Notes (Signed)
Pt is confused and not responding properly to questions.  Pt exhibits no neurologic deficits with equal grips bilaterally.  Pt admits to drinking only "one shot of peach ???".  Pt is off balance and difficult to understand.

## 2011-05-01 NOTE — ED Provider Notes (Signed)
History     CSN: [id number]  Arrival date & time 05/01/11  2305   First MD Initiated Contact with Patient 05/01/11 2334      Chief Complaint  Patient presents with  . Altered Mental Status    (Consider location/radiation/quality/duration/timing/severity/associated sxs/prior treatment) HPI Comments: Patient says that her boyfriend had called me on, saying that she had slurred speech and could be having a stroke. Beyond went over and picked her up and brought her to [facility] long ED. The patient admits to having a shot of picograms BP today. Review of old charts shows a prior history of alcohol and cocaine abuse. She also has nonischemic cardiomyopathy, with an ejection fraction of 25% in 2008. She is followed for this by Olga Millers M.D., [facility] Cardiologist.  Patient is a 51 y.o. female presenting with altered mental status. The history is provided by a relative (Patient's aunt gives her history.). No language interpreter was used.  Altered Mental Status This is a new problem. Episode onset: An unknown time ago.  The problem occurs constantly. Associated symptoms comments: Slurred speech, unsteady gait.. The symptoms are aggravated by nothing. The symptoms are relieved by nothing. She has tried nothing for the symptoms.    Past Medical History  Diagnosis Date  . CHF (congestive heart failure)   . Pulmonary nodule, left     f/u chest CT 2/12: interval clearing of RUL air space nodule, borderline enlarged mediastinal lymph nodes stable, pul. arterial HTN  . Emphysema   . Ovarian cyst     ovarian cystic mass  . Cocaine abuse   . Alcohol abuse   . Hypertension     pulmonary  . 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 Mitral stenosis   . Nonischemic cardiomyopathy     a. cath 6/08: pLAD 30%, mLAD 30%, oCFX 40%, dCVS 40%; OM 50%; dRCA 40%;    b. echo 06/01/10: severe LVH; EF 45-50%; inf HK; mild to mod AS (mean 30), mod. AI; mild to mod MS; mod MR; severe LAE, mod TR,  mod PR, PASP 59;  c.  w/u for Amyloid done in past  . Tubal pregnancy     Hx  . Cannabis abuse   . Fibroids     abd. CT 2/12: large fibroid uterus  . Personal history of noncompliance with medical treatment, presenting hazards to health   . Pulmonary HTN     Past Surgical History  Procedure Date  . Tubal ligation     Family History  Problem Relation Age of Onset  . Coronary artery disease    . Hypertension Mother     History  Substance Use Topics  . Smoking status: Former Smoker -- 1.0 packs/day for 10 years    Types: Cigarettes  . Smokeless tobacco: Not on file  . Alcohol Use: 0.6 oz/week    1 Shots of liquor per week    OB History    Grav Para Term Preterm Abortions TAB SAB Ect Mult Living                  Review of Systems  Unable to perform ROS: Mental status change  Psychiatric/Behavioral: Positive for altered mental status.    Allergies  Penicillins  Home Medications   Current Outpatient Rx  Name Route Sig Dispense Refill  . AMLODIPINE BESYLATE 10 MG PO TABS  TAKE ONE TABLET BY MOUTH EVERY DAY 30 tablet 6  . CARVEDILOL 25 MG PO TABS Oral Take 1  tablet (25 mg total) by mouth 2 (two) times daily with a meal. 60 tablet 12  . ENALAPRIL MALEATE 20 MG PO TABS  TAKE ONE TABLET BY MOUTH TWICE DAILY 60 tablet 12  . FUROSEMIDE 40 MG PO TABS  Take two tablets twice daily 120 tablet 12  . POTASSIUM CHLORIDE 20 MEQ PO PACK Oral Take 20 mEq by mouth 2 (two) times daily. 60 tablet 12    BP 122/84  Pulse 86  Temp(Src) 98.7 F (37.1 C) (Oral)  Resp 19  SpO2 95%  Physical Exam  Constitutional: She appears well-developed and well-nourished. No distress.  HENT:  Head: Normocephalic and atraumatic.  Right Ear: External ear normal.  Left Ear: External ear normal.       ETOH on breath.  Neck: Normal range of motion. Neck supple. No JVD present.  Cardiovascular: Normal rate, regular rhythm and normal heart sounds.   Pulmonary/Chest: Effort normal and breath sounds  normal.  Abdominal: Soft. Bowel sounds are normal. She exhibits no distension. There is no tenderness.  Musculoskeletal: Normal range of motion.  Lymphadenopathy:    She has no cervical adenopathy.  Neurological:       Patient is drowsy, but can answer simple questions. Her speech is notably slurred my observation. She has no focal motor deficit and no sensory deficit. Gait was not tested.  Skin: Skin is warm and dry.  Psychiatric:       She has a subdued, depressed affect.    ED Course  Procedures (including critical care time)   Labs Reviewed  URINE RAPID DRUG SCREEN (HOSP PERFORMED)  CBC  DIFFERENTIAL  COMPREHENSIVE METABOLIC PANEL  URINALYSIS, ROUTINE W REFLEX MICROSCOPIC  URINE CULTURE  CARDIAC PANEL(CRET KIN+CKTOT+MB+TROPI)  D-DIMER, QUANTITATIVE  PRO B NATRIURETIC PEPTIDE  ETHANOL  URINE RAPID DRUG SCREEN (HOSP PERFORMED)  ACETAMINOPHEN LEVEL  SALICYLATE LEVEL  TRICYCLICS SCREEN, URINE    11:50 PM Region was seen by me and also by Dione Booze M.D. Laboratory testing was ordered. Old charts were reviewed.  No diagnosis found.         Carleene Cooper III, MD 05/01/11 2351

## 2011-05-01 NOTE — ED Provider Notes (Signed)
History     CSN: [id number]  Arrival date & time 05/01/11  2305   First MD Initiated Contact with Patient 05/01/11 2334      Chief Complaint  Patient presents with  . Altered Mental Status    (Consider location/radiation/quality/duration/timing/severity/associated sxs/prior treatment) Patient is a 51 y.o. female presenting with altered mental status. The history is provided by the patient and a relative.  Altered Mental Status  She was noted at 9:00 tonight at to be sleeping and when awakened seemed to have slurred speech. Her partner was concerned that she was having a stroke. She's been sleeping off and on all day long and was last known to be neurologically normal last night. She denies headache, nausea, vomiting, dizziness, weakness. She denies any ethanol consumption today. Family member who is with her states that her speech appears to be slurred, but does not know when she was last normal. She found the last known normal time by calling her partner. Symptoms are moderate to severe. Nothing makes it better nothing makes them worse.  Past Medical History  Diagnosis Date  . CHF (congestive heart failure)   . Pulmonary nodule, left     f/u chest CT 2/12: interval clearing of RUL air space nodule, borderline enlarged mediastinal lymph nodes stable, pul. arterial HTN  . Emphysema   . Ovarian cyst     ovarian cystic mass  . Cocaine abuse   . Alcohol abuse   . Hypertension     pulmonary  . 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 Mitral stenosis   . Nonischemic cardiomyopathy     a. cath 6/08: pLAD 30%, mLAD 30%, oCFX 40%, dCVS 40%; OM 50%; dRCA 40%;    b. echo 06/01/10: severe LVH; EF 45-50%; inf HK; mild to mod AS (mean 30), mod. AI; mild to mod MS; mod MR; severe LAE, mod TR, mod PR, PASP 59;  c.  w/u for Amyloid done in past  . Tubal pregnancy     Hx  . Cannabis abuse   . Fibroids     abd. CT 2/12: large fibroid uterus  . Personal history of noncompliance  with medical treatment, presenting hazards to health   . Pulmonary HTN     Past Surgical History  Procedure Date  . Tubal ligation     Family History  Problem Relation Age of Onset  . Coronary artery disease    . Hypertension Mother     History  Substance Use Topics  . Smoking status: Former Smoker -- 1.0 packs/day for 10 years    Types: Cigarettes  . Smokeless tobacco: Not on file  . Alcohol Use: 0.6 oz/week    1 Shots of liquor per week    OB History    Grav Para Term Preterm Abortions TAB SAB Ect Mult Living                  Review of Systems  Psychiatric/Behavioral: Positive for altered mental status.  All other systems reviewed and are negative.    Allergies  Penicillins  Home Medications   Current Outpatient Rx  Name Route Sig Dispense Refill  . AMLODIPINE BESYLATE 10 MG PO TABS  TAKE ONE TABLET BY MOUTH EVERY DAY 30 tablet 6  . CARVEDILOL 25 MG PO TABS Oral Take 1 tablet (25 mg total) by mouth 2 (two) times daily with a meal. 60 tablet 12  . ENALAPRIL MALEATE 20 MG PO TABS  TAKE ONE TABLET BY  MOUTH TWICE DAILY 60 tablet 12  . FUROSEMIDE 40 MG PO TABS  Take two tablets twice daily 120 tablet 12  . POTASSIUM CHLORIDE 20 MEQ PO PACK Oral Take 20 mEq by mouth 2 (two) times daily. 60 tablet 12    BP 122/84  Pulse 86  Temp(Src) 98.7 F (37.1 C) (Oral)  Resp 19  SpO2 95%  Physical Exam  Nursing note and vitals reviewed.  51 year old female who is drowsy but easily arousable. Vital signs are normal. Oxygen saturation is 95% which is normal. Head is normocephalic and atraumatic. PERRLA, EOMI. Fundi show no hemorrhages or exudates. Neck is supple without adenopathy or JVD. Lungs are clear without rales, wheezes, rhonchi. Heart has regular rate and rhythm without murmur. Abdomen is soft, flat, nontenderwithout masses or hepatosplenomegaly. Extremities have no cyanosis or edema, full range of motion is present. Skin is warm and moist without rash. Neurologic: She  is drowsy but easily aroused. When aroused, speech is mildly dysarthric. Cranial nerves are intact. There no focal motor or sensory deficits. She does have a moderate odor of ethanol on her breath. There is no pronator drift. She is oriented to person and place and is mildly disoriented to time thinking it is Sunday and thinking the year is 2012.  ED Course  Procedures (including critical care time)   Labs Reviewed  URINE RAPID DRUG SCREEN (HOSP PERFORMED)  CBC  DIFFERENTIAL  COMPREHENSIVE METABOLIC PANEL  URINALYSIS, ROUTINE W REFLEX MICROSCOPIC  URINE CULTURE  CARDIAC PANEL(CRET KIN+CKTOT+MB+TROPI)  D-DIMER, QUANTITATIVE  PRO B NATRIURETIC PEPTIDE  ETHANOL  URINE RAPID DRUG SCREEN (HOSP PERFORMED)  ACETAMINOPHEN LEVEL  SALICYLATE LEVEL  TRICYCLICS SCREEN, URINE   No results found. Results for orders placed during the hospital encounter of 05/01/11  URINE RAPID DRUG SCREEN (HOSP PERFORMED)      Component Value Range   Opiates NONE DETECTED  NONE DETECTED    Cocaine NONE DETECTED  NONE DETECTED    Benzodiazepines NONE DETECTED  NONE DETECTED    Amphetamines NONE DETECTED  NONE DETECTED    Tetrahydrocannabinol POSITIVE (*) NONE DETECTED    Barbiturates NONE DETECTED  NONE DETECTED   CBC      Component Value Range   WBC 3.5 (*) 4.0 - 10.5 (K/uL)   RBC 4.25  3.87 - 5.11 (MIL/uL)   Hemoglobin 14.0  12.0 - 15.0 (g/dL)   HCT 16.1  09.6 - 04.5 (%)   MCV 95.3  78.0 - 100.0 (fL)   MCH 32.9  26.0 - 34.0 (pg)   MCHC 34.6  30.0 - 36.0 (g/dL)   RDW 40.9  81.1 - 91.4 (%)   Platelets 237  150 - 400 (K/uL)  DIFFERENTIAL      Component Value Range   Neutrophils Relative 32 (*) 43 - 77 (%)   Neutro Abs 1.1 (*) 1.7 - 7.7 (K/uL)   Lymphocytes Relative 52 (*) 12 - 46 (%)   Lymphs Abs 1.8  0.7 - 4.0 (K/uL)   Monocytes Relative 12  3 - 12 (%)   Monocytes Absolute 0.4  0.1 - 1.0 (K/uL)   Eosinophils Relative 3  0 - 5 (%)   Eosinophils Absolute 0.1  0.0 - 0.7 (K/uL)   Basophils Relative 1   0 - 1 (%)   Basophils Absolute 0.0  0.0 - 0.1 (K/uL)  COMPREHENSIVE METABOLIC PANEL      Component Value Range   Sodium 141  135 - 145 (mEq/L)   Potassium 3.2 (*) 3.5 -  5.1 (mEq/L)   Chloride 102  96 - 112 (mEq/L)   CO2 25  19 - 32 (mEq/L)   Glucose, Bld 96  70 - 99 (mg/dL)   BUN 21  6 - 23 (mg/dL)   Creatinine, Ser 1.61  0.50 - 1.10 (mg/dL)   Calcium 8.9  8.4 - 09.6 (mg/dL)   Total Protein 7.5  6.0 - 8.3 (g/dL)   Albumin 3.7  3.5 - 5.2 (g/dL)   AST 28  0 - 37 (U/L)   ALT 18  0 - 35 (U/L)   Alkaline Phosphatase 72  39 - 117 (U/L)   Total Bilirubin 0.4  0.3 - 1.2 (mg/dL)   GFR calc non Af Amer 59 (*) >90 (mL/min)   GFR calc Af Amer 69 (*) >90 (mL/min)  URINALYSIS, ROUTINE W REFLEX MICROSCOPIC      Component Value Range   Color, Urine YELLOW  YELLOW    APPearance CLOUDY (*) CLEAR    Specific Gravity, Urine 1.021  1.005 - 1.030    pH 5.5  5.0 - 8.0    Glucose, UA NEGATIVE  NEGATIVE (mg/dL)   Hgb urine dipstick NEGATIVE  NEGATIVE    Bilirubin Urine NEGATIVE  NEGATIVE    Ketones, ur NEGATIVE  NEGATIVE (mg/dL)   Protein, ur NEGATIVE  NEGATIVE (mg/dL)   Urobilinogen, UA 0.2  0.0 - 1.0 (mg/dL)   Nitrite NEGATIVE  NEGATIVE    Leukocytes, UA NEGATIVE  NEGATIVE   CARDIAC PANEL(CRET KIN+CKTOT+MB+TROPI)      Component Value Range   Total CK 76  7 - 177 (U/L)   CK, MB 3.5  0.3 - 4.0 (ng/mL)   Troponin I <0.30  <0.30 (ng/mL)   Relative Index RELATIVE INDEX IS INVALID  0.0 - 2.5   D-DIMER, QUANTITATIVE      Component Value Range   D-Dimer, Quant 0.43  0.00 - 0.48 (ug/mL-FEU)  PRO B NATRIURETIC PEPTIDE      Component Value Range   Pro B Natriuretic peptide (BNP) 1936.0 (*) 0 - 125 (pg/mL)  ETHANOL      Component Value Range   Alcohol, Ethyl (B) 32 (*) 0 - 11 (mg/dL)  ACETAMINOPHEN LEVEL      Component Value Range   Acetaminophen (Tylenol), Serum <15.0  10 - 30 (ug/mL)  SALICYLATE LEVEL      Component Value Range   Salicylate Lvl <2.0 (*) 2.8 - 20.0 (mg/dL)   Dg Chest 2  View  0/45/4098  *RADIOLOGY REPORT*  Clinical Data: Altered level of consciousness, cough, history of CHF, emphysema, hypertension  CHEST - 2 VIEW  Comparison: 07/17/2008  Findings: Enlargement of cardiac silhouette with pulmonary vascular congestion. Mediastinal contours normal. Minimal chronic accentuation of interstitial markings. No acute failure or consolidation. No pleural effusion or pneumothorax. Bones unremarkable.  IMPRESSION: Enlargement of cardiac silhouette with pulmonary vascular congestion. No acute abnormalities.  Original Report Authenticated By: Lollie Marrow, M.D.   Ct Head Wo Contrast  05/02/2011  *RADIOLOGY REPORT*  Clinical Data: Altered level of consciousness; confusion.  CT HEAD WITHOUT CONTRAST  Technique:  Contiguous axial images were obtained from the base of the skull through the vertex without contrast.  Comparison: None.  Findings: There is no evidence of acute infarction, mass lesion, or intra- or extra-axial hemorrhage on CT.  The posterior fossa, including the cerebellum, brainstem and fourth ventricle, is within normal limits.  The third and lateral ventricles, and basal ganglia are unremarkable in appearance.  The cerebral hemispheres are symmetric in  appearance, with normal gray- white differentiation.  No mass effect or midline shift is seen.  There is no evidence of fracture; visualized osseous structures are unremarkable in appearance.  The visualized portions of the orbits are within normal limits.  There is partial opacification of the left maxillary sinus; the remaining paranasal sinuses and mastoid air cells are well-aerated.  No significant soft tissue abnormalities are seen.  IMPRESSION:  1.  No acute intracranial pathology seen on CT. 2.  Partial opacification of the left maxillary sinus.  Original Report Authenticated By: Tonia Ghent, M.D.      1. Altered mental status      Date: 05/01/2011  Rate: 85  Rhythm: normal sinus rhythm  QRS Axis: normal   Intervals: normal  ST/T Wave abnormalities: Inverted T waves in the anterolateral and inferior leads  Conduction Disutrbances:Incomplete left bundle branch block  Narrative Interpretation: New T-wave inversions compared with ECG of 07/17/2008  Old EKG Reviewed: changes noted  0125: Workup has been negative for causes for altered mental status. Alcohol level was below the legal limit of intoxication. Patient speech now is back to normal with no evidence of dysarthria. Family member who is with her states that she is back to normal. She may have indeed if you have been drunk earlier in the day, and that is the only significant pathology identified on evaluation. She did have marijuana present on her drug screen, but I do not believe that was what was causing her symptoms.  MDM  Altered mental status is most likely due 2 ethanol intoxication. Full altered mental status workup has been initiated. Her old records of been reviewed and she is treated for nonischemic cardiomyopathy by Dr. Jens Som of [facility] cardiology. She states that he is her only physician.        Dione Booze, MD 05/02/11 [phone]

## 2011-05-02 LAB — COMPREHENSIVE METABOLIC PANEL
AST: 28 U/L (ref 0–37)
Albumin: 3.7 g/dL (ref 3.5–5.2)
BUN: 21 mg/dL (ref 6–23)
Calcium: 8.9 mg/dL (ref 8.4–10.5)
Creatinine, Ser: 1.07 mg/dL (ref 0.50–1.10)
Total Protein: 7.5 g/dL (ref 6.0–8.3)

## 2011-05-02 LAB — SALICYLATE LEVEL: Salicylate Lvl: <2 mg/dL — ABNORMAL LOW (ref 2.8–20.0)

## 2011-05-02 LAB — DIFFERENTIAL
Basophils Absolute: 0 10*3/uL (ref 0.0–0.1)
Basophils Relative: 1 % (ref 0–1)
Eosinophils Absolute: 0.1 10*3/uL (ref 0.0–0.7)
Eosinophils Relative: 3 % (ref 0–5)
Monocytes Absolute: 0.4 10*3/uL (ref 0.1–1.0)
Monocytes Relative: 12 % (ref 3–12)
Neutro Abs: 1.1 10*3/uL — ABNORMAL LOW (ref 1.7–7.7)

## 2011-05-02 LAB — CBC
HCT: 40.5 % (ref 36.0–46.0)
MCH: 32.9 pg (ref 26.0–34.0)
MCHC: 34.6 g/dL (ref 30.0–36.0)
MCV: 95.3 fL (ref 78.0–100.0)
RDW: 13.3 % (ref 11.5–15.5)

## 2011-05-02 LAB — D-DIMER, QUANTITATIVE: D-Dimer, Quant: 0.43 ug/mL-FEU (ref 0.00–0.48)

## 2011-05-02 LAB — PRO B NATRIURETIC PEPTIDE: Pro B Natriuretic peptide (BNP): 1936 pg/mL — ABNORMAL HIGH (ref 0–125)

## 2011-05-02 LAB — URINE CULTURE: Colony Count: 15000

## 2011-05-02 LAB — URINALYSIS, ROUTINE W REFLEX MICROSCOPIC
Glucose, UA: NEGATIVE mg/dL
Hgb urine dipstick: NEGATIVE
Leukocytes, UA: NEGATIVE
Specific Gravity, Urine: 1.021 (ref 1.005–1.030)

## 2011-05-02 LAB — RAPID URINE DRUG SCREEN, HOSP PERFORMED
Barbiturates: NOT DETECTED
Benzodiazepines: NOT DETECTED
Cocaine: NOT DETECTED
Opiates: NOT DETECTED

## 2011-05-02 LAB — CARDIAC PANEL(CRET KIN+CKTOT+MB+TROPI)
CK, MB: 3.5 ng/mL (ref 0.3–4.0)
Troponin I: <0.3 ng/mL (ref <0.30)

## 2011-05-02 LAB — ETHANOL: Alcohol, Ethyl (B): 32 mg/dL — ABNORMAL HIGH (ref 0–11)

## 2011-05-02 LAB — TRICYCLICS SCREEN, URINE: TCA Scrn: NOT DETECTED

## 2011-05-28 ENCOUNTER — Emergency Department (HOSPITAL_COMMUNITY): Payer: Medicaid Other

## 2011-05-28 ENCOUNTER — Inpatient Hospital Stay (HOSPITAL_COMMUNITY)
Admission: EM | Admit: 2011-05-28 | Discharge: 2011-06-11 | DRG: 287 | Disposition: A | Discharge status: Home / Self Care | Payer: Medicaid Other | Attending: Internal Medicine | Admitting: Internal Medicine

## 2011-05-28 ENCOUNTER — Other Ambulatory Visit: Payer: Self-pay

## 2011-05-28 ENCOUNTER — Encounter (HOSPITAL_COMMUNITY): Payer: Self-pay

## 2011-05-28 DIAGNOSIS — Z23 Encounter for immunization: Secondary | ICD-10-CM

## 2011-05-28 DIAGNOSIS — F1410 Cocaine abuse, uncomplicated: Secondary | ICD-10-CM | POA: Diagnosis present

## 2011-05-28 DIAGNOSIS — I38 Endocarditis, valve unspecified: Secondary | ICD-10-CM

## 2011-05-28 DIAGNOSIS — R059 Cough, unspecified: Secondary | ICD-10-CM

## 2011-05-28 DIAGNOSIS — R05 Cough: Secondary | ICD-10-CM

## 2011-05-28 DIAGNOSIS — J40 Bronchitis, not specified as acute or chronic: Secondary | ICD-10-CM | POA: Diagnosis present

## 2011-05-28 DIAGNOSIS — I319 Disease of pericardium, unspecified: Secondary | ICD-10-CM | POA: Diagnosis present

## 2011-05-28 DIAGNOSIS — R609 Edema, unspecified: Secondary | ICD-10-CM

## 2011-05-28 DIAGNOSIS — I428 Other cardiomyopathies: Secondary | ICD-10-CM

## 2011-05-28 DIAGNOSIS — Z91199 Patient's noncompliance with other medical treatment and regimen due to unspecified reason: Secondary | ICD-10-CM

## 2011-05-28 DIAGNOSIS — R0602 Shortness of breath: Secondary | ICD-10-CM

## 2011-05-28 DIAGNOSIS — I359 Nonrheumatic aortic valve disorder, unspecified: Secondary | ICD-10-CM

## 2011-05-28 DIAGNOSIS — I5023 Acute on chronic systolic (congestive) heart failure: Principal | ICD-10-CM | POA: Diagnosis present

## 2011-05-28 DIAGNOSIS — I351 Nonrheumatic aortic (valve) insufficiency: Secondary | ICD-10-CM

## 2011-05-28 DIAGNOSIS — I5042 Chronic combined systolic (congestive) and diastolic (congestive) heart failure: Secondary | ICD-10-CM

## 2011-05-28 DIAGNOSIS — Z7982 Long term (current) use of aspirin: Secondary | ICD-10-CM

## 2011-05-28 DIAGNOSIS — J438 Other emphysema: Secondary | ICD-10-CM | POA: Diagnosis present

## 2011-05-28 DIAGNOSIS — F1210 Cannabis abuse, uncomplicated: Secondary | ICD-10-CM | POA: Diagnosis present

## 2011-05-28 DIAGNOSIS — N83209 Unspecified ovarian cyst, unspecified side: Secondary | ICD-10-CM | POA: Diagnosis present

## 2011-05-28 DIAGNOSIS — I10 Essential (primary) hypertension: Secondary | ICD-10-CM | POA: Diagnosis present

## 2011-05-28 DIAGNOSIS — Z880 Allergy status to penicillin: Secondary | ICD-10-CM

## 2011-05-28 DIAGNOSIS — E876 Hypokalemia: Secondary | ICD-10-CM | POA: Diagnosis present

## 2011-05-28 DIAGNOSIS — Z79899 Other long term (current) drug therapy: Secondary | ICD-10-CM

## 2011-05-28 DIAGNOSIS — F17200 Nicotine dependence, unspecified, uncomplicated: Secondary | ICD-10-CM | POA: Diagnosis present

## 2011-05-28 DIAGNOSIS — F1010 Alcohol abuse, uncomplicated: Secondary | ICD-10-CM | POA: Diagnosis present

## 2011-05-28 DIAGNOSIS — I350 Nonrheumatic aortic (valve) stenosis: Secondary | ICD-10-CM

## 2011-05-28 DIAGNOSIS — I080 Rheumatic disorders of both mitral and aortic valves: Secondary | ICD-10-CM | POA: Diagnosis present

## 2011-05-28 DIAGNOSIS — I079 Rheumatic tricuspid valve disease, unspecified: Secondary | ICD-10-CM | POA: Diagnosis present

## 2011-05-28 DIAGNOSIS — R55 Syncope and collapse: Secondary | ICD-10-CM

## 2011-05-28 DIAGNOSIS — I517 Cardiomegaly: Secondary | ICD-10-CM | POA: Diagnosis present

## 2011-05-28 DIAGNOSIS — I2510 Atherosclerotic heart disease of native coronary artery without angina pectoris: Secondary | ICD-10-CM | POA: Diagnosis present

## 2011-05-28 DIAGNOSIS — I2789 Other specified pulmonary heart diseases: Secondary | ICD-10-CM | POA: Diagnosis present

## 2011-05-28 DIAGNOSIS — Z9119 Patient's noncompliance with other medical treatment and regimen: Secondary | ICD-10-CM

## 2011-05-28 DIAGNOSIS — I5021 Acute systolic (congestive) heart failure: Secondary | ICD-10-CM | POA: Diagnosis present

## 2011-05-28 DIAGNOSIS — I509 Heart failure, unspecified: Secondary | ICD-10-CM

## 2011-05-28 DIAGNOSIS — Z6828 Body mass index (BMI) 28.0-28.9, adult: Secondary | ICD-10-CM

## 2011-05-28 DIAGNOSIS — D259 Leiomyoma of uterus, unspecified: Secondary | ICD-10-CM | POA: Diagnosis present

## 2011-05-28 LAB — PRO B NATRIURETIC PEPTIDE: Pro B Natriuretic peptide (BNP): 2735 pg/mL — ABNORMAL HIGH (ref 0–125)

## 2011-05-28 LAB — CREATININE, SERUM
Creatinine, Ser: 0.73 mg/dL (ref 0.50–1.10)
GFR calc non Af Amer: >90 mL/min (ref >90)

## 2011-05-28 LAB — RAPID URINE DRUG SCREEN, HOSP PERFORMED
Amphetamines: NOT DETECTED
Barbiturates: NOT DETECTED
Cocaine: POSITIVE — AB
Opiates: NOT DETECTED
Tetrahydrocannabinol: POSITIVE — AB

## 2011-05-28 LAB — DIFFERENTIAL
Basophils Absolute: 0 10*3/uL (ref 0.0–0.1)
Basophils Relative: 1 % (ref 0–1)
Eosinophils Absolute: 0 10*3/uL (ref 0.0–0.7)
Eosinophils Relative: 0 % (ref 0–5)
Neutrophils Relative %: 66 % (ref 43–77)

## 2011-05-28 LAB — CBC
MCH: 33.1 pg (ref 26.0–34.0)
MCH: 33.2 pg (ref 26.0–34.0)
MCHC: 33.6 g/dL (ref 30.0–36.0)
MCHC: 34.3 g/dL (ref 30.0–36.0)
MCV: 98.6 fL (ref 78.0–100.0)
Platelets: 202 10*3/uL (ref 150–400)
Platelets: 173 10*3/uL (ref 150–400)
RBC: 3.62 MIL/uL — ABNORMAL LOW (ref 3.87–5.11)
RDW: 15 % (ref 11.5–15.5)
RDW: 14.9 % (ref 11.5–15.5)

## 2011-05-28 LAB — BASIC METABOLIC PANEL
Calcium: 8.3 mg/dL — ABNORMAL LOW (ref 8.4–10.5)
GFR calc Af Amer: >90 mL/min (ref >90)
GFR calc non Af Amer: >90 mL/min (ref >90)
Glucose, Bld: 103 mg/dL — ABNORMAL HIGH (ref 70–99)
Potassium: 3.5 mEq/L (ref 3.5–5.1)
Sodium: 140 mEq/L (ref 135–145)

## 2011-05-28 LAB — TROPONIN I: Troponin I: <0.3 ng/mL (ref <0.30)

## 2011-05-28 MED ORDER — ASPIRIN EC 81 MG PO TBEC
81.0000 mg | DELAYED_RELEASE_TABLET | Freq: Every day | ORAL | Status: DC
  Administered 2011-05-29 – 2011-06-11 (×12): 81 mg via ORAL
  Filled 2011-05-28 (×15): qty 1

## 2011-05-28 MED ORDER — ONDANSETRON HCL 4 MG/2ML IJ SOLN: 4.0000 mg | Freq: Four times a day (QID) | INTRAMUSCULAR | Status: DC | PRN

## 2011-05-28 MED ORDER — ZOLPIDEM TARTRATE 5 MG PO TABS
5.0000 mg | ORAL_TABLET | Freq: Every evening | ORAL | Status: DC | PRN
  Administered 2011-05-29 – 2011-06-10 (×13): 5 mg via ORAL
  Filled 2011-05-28 (×13): qty 1

## 2011-05-28 MED ORDER — ALPRAZOLAM 0.25 MG PO TABS
0.2500 mg | ORAL_TABLET | Freq: Two times a day (BID) | ORAL | Status: DC | PRN
  Administered 2011-05-29 – 2011-06-10 (×12): 0.25 mg via ORAL
  Filled 2011-05-28 (×12): qty 1

## 2011-05-28 MED ORDER — SODIUM CHLORIDE 0.9 % IV SOLN: INTRAVENOUS | Status: DC

## 2011-05-28 MED ORDER — ACETAMINOPHEN 325 MG PO TABS
650.0000 mg | ORAL_TABLET | ORAL | Status: DC | PRN
Start: 2011-05-28 — End: 2011-06-07
  Administered 2011-05-28 – 2011-06-05 (×12): 650 mg via ORAL
  Filled 2011-05-28 (×7): qty 2
  Filled 2011-05-28: qty 1
  Filled 2011-05-28 (×4): qty 2

## 2011-05-28 MED ORDER — ALBUTEROL SULFATE (5 MG/ML) 0.5% IN NEBU
2.5000 mg | INHALATION_SOLUTION | Freq: Once | RESPIRATORY_TRACT | Status: AC
  Administered 2011-05-28: 2.5 mg via RESPIRATORY_TRACT
  Filled 2011-05-28: qty 0.5

## 2011-05-28 MED ORDER — SODIUM CHLORIDE 0.9 % IV SOLN
INTRAVENOUS | Status: AC
  Administered 2011-05-28: 23:00:00 via INTRAVENOUS

## 2011-05-28 MED ORDER — NITROGLYCERIN 0.4 MG SL SUBL: 0.4000 mg | SUBLINGUAL_TABLET | SUBLINGUAL | Status: DC | PRN

## 2011-05-28 MED ORDER — ENOXAPARIN SODIUM 40 MG/0.4ML ~~LOC~~ SOLN
40.0000 mg | SUBCUTANEOUS | Status: DC
  Administered 2011-05-28 – 2011-06-06 (×10): 40 mg via SUBCUTANEOUS
  Filled 2011-05-28 (×11): qty 0.4

## 2011-05-28 MED ORDER — IPRATROPIUM BROMIDE 0.02 % IN SOLN
0.5000 mg | Freq: Once | RESPIRATORY_TRACT | Status: AC
  Administered 2011-05-28: 0.5 mg via RESPIRATORY_TRACT
  Filled 2011-05-28: qty 2.5

## 2011-05-28 MED ORDER — FUROSEMIDE 10 MG/ML IJ SOLN
80.0000 mg | Freq: Once | INTRAMUSCULAR | Status: AC
  Administered 2011-05-28: 80 mg via INTRAVENOUS
  Filled 2011-05-28: qty 8

## 2011-05-28 NOTE — ED Notes (Signed)
Per [facility] note pt will be admitted.

## 2011-05-28 NOTE — ED Notes (Signed)
Cold symptoms and sob for weeks, pt. Reports having a hx of CHF

## 2011-05-28 NOTE — ED Notes (Signed)
MD at bedside. 

## 2011-05-28 NOTE — ED Notes (Signed)
Pt reports shortness of breath x 1 week increasingly worse. Productive cough x 6 weeks. Reports nausea/vomiting x couple of days, but improved. Pain under left breast x 2 weeks. Pt reports passing out on Saturday and Wednesday. Pt states she was sitting down, room began to spin and reports waking up on floor.

## 2011-05-28 NOTE — ED Notes (Addendum)
Pt abe to change positions after starting breathing treatment. Now able to sit on the side of bed and feels breathing is slightly better.  Pt asking why her hands and feet are going numb.  This has been happening 2-3 weeks also.  Slight edema in lower extremities, pt claims she has taken an extra does of lasix and they are less swollen than they were.

## 2011-05-28 NOTE — ED Notes (Signed)
MD at bedside. [place]

## 2011-05-28 NOTE — Consult Note (Signed)
History and Physical  Patient ID: [name] Patient ID: [name] MRN: [id number], DOB/AGE: [birth year] 51 y.o. 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 She has not felt well for over a month. Sometime in January, she got an upper respiratory infection. At one point she had some low-grade fevers and a significant cough. Her general symptoms improved but the cough has continued. It is occasionally productive of clear sputum. Recently she has noticed increasing dyspnea on exertion. She does not weigh yourself but notes that she was also having lower extremity edema. Several weeks ago, because of the edema, she increased her Lasix from 40 mg, 2 tablets twice a day to 40 mg, 3 tablets twice a day. The lower extremity edema improved. Her cough improved some but has continued. She describes orthopnea and PND. Although the lower extremity edema improved, she felt that her abdomen was distended and had some fluid in it.  On Saturday, the patient was sitting on the sofa and developed dizziness which was followed shortly thereafter by a syncopal episode. She states she woke up on the floor with some blood on her face that was from a place on her tongue where she had bitten it. She had no incontinence of bowel or bladder. The incident was not witnessed. She does not recall a postictal state. 2 days ago, she had a repeat episode. Once again, it was not associated with any activity, shortness of breath or chest pain. She had the same prodrome of dizziness and a fall without significant injuries. Today, she stated she just couldn't take it anymore. She had no new symptoms but felt that she needed treatment and came to the emergency room. In the emergency room, she has been in sinus rhythm so far and O2 saturations  are 96% on room air. She has not had any recent chest pain. She has no recent history of palpitations or presyncope.  Past Medical History  Diagnosis Date  . CHF (congestive heart failure)   . Pulmonary nodule, left     f/u chest CT 2/12: interval clearing of RUL air space nodule, borderline enlarged mediastinal lymph nodes stable, pul. arterial HTN  . Emphysema   . Ovarian cyst     ovarian cystic mass  . Cocaine abuse   . Alcohol abuse   . Hypertension     pulmonary  . 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 Wall thickness was increased in a pattern of severe LVH. Systolic function was mildly reduced. The estimated ejection fraction was in the range of 45% to 50%. There is hypokinesis of the mid-distal inferior myocardium. Doppler parameters are consistent with high ventricular filling pressure.   - Aortic valve: There was moderate-severe stenosis. Moderate regurgitation. Valve area: 0.69cm^2(VTI). Valve area: 0.73cm^2 (Vmax). Mean gradient: 30mm Hg (S)   - Mitral valve: The findings are consistent with mild to moderate stenosis. Moderate regurgitation. Valve area by continuity equation (using LVOT flow): 0.96cm^2.   - Left atrium: The atrium was severely dilated.   - Tricuspid valve: Moderate regurgitation.   - Pulmonic valve: Moderate regurgitation.   - Pulmonary arteries: PA peak pressure: 59mm Hg (S).   - Pericardium, extracardiac: A trivial pericardial effusion was identified. Feb 2012  .  Mitral stenosis   . Nonischemic cardiomyopathy     a. cath 6/08: pLAD 30%, mLAD 30%, oCFX 40%, dCVS 40%; OM 50%; dRCA 40%;    b. echo 06/01/10: severe LVH; EF 45-50%; inf HK; mild to mod AS (mean 30), mod. AI; mild to mod MS; mod MR; severe LAE, mod TR, mod PR, PASP 59;  c.  w/u for Amyloid done in past  . Tubal pregnancy     Hx  . Cannabis abuse   . Fibroids     abd. CT 2/12: large fibroid uterus  . Personal  history of noncompliance with medical treatment, presenting hazards to health   . Pulmonary HTN      Surgical History:  Past Surgical History  Procedure Date   Cardiac Cath 2008  . Tubal ligation      I have reviewed the patient's current medications.  Current Outpatient Prescriptions on File Prior to Encounter  Medication Sig Dispense Refill  . amLODipine (NORVASC) 10 MG tablet TAKE ONE TABLET BY MOUTH EVERY DAY  30 tablet  6  . carvedilol (COREG) 25 MG tablet Take 1 tablet (25 mg total) by mouth 2 times daily with a meal.  60 tablet  12  . enalapril (VASOTEC) 20 MG tablet TAKE ONE TABLET BY MOUTH TWICE DAILY  60 tablet  12  . furosemide (LASIX) 40 MG tablet Take two tablets twice daily  120 tablet  12  . potassium chloride (KLOR-CON) 20 MEQ  Take 20 mEq by mouth 2 (two) times daily.  60 tablet  12   Allergies:  Allergies  Allergen Reactions  . Penicillins Hives    History   Social History  . Marital Status: Single, has fiance    Spouse Name: N/A    Number of Children: N/A  . Years of Education: N/A   Occupational History  .  Previously she was a Conservation officer, [employer].    Social History Main Topics  . Smoking status: Former Smoker -- 1.0 packs/day for 10 years, Quit > 1 year    Types: Cigarettes  . Smokeless tobacco: Not on file  . Alcohol Use: 0.6 oz/week - denies abuse    1 Shots of liquor per week  . Drug Use: Yes    Special: Marijuana - infrequent use     last smoked tonight  . Sexually Active: No   Other Topics Concern  .  She used to smoke pot on a daily basis.  She used to do cocaine approximately one time every 6 months but states it has been years.  She denies exercise or specific diet.    Social History Narrative   Single, no children,Tobacco Use - Hx of x-20 years, remote history Cocaine and Marijuana use    Family History  -  Her mother died at the age of 73 after several  myocardial infarctions, history of hypertension.  Her father died in his 68s with a  ruptured esophagus.  She has two brothers living, one brother deceased secondary to esophageal cancer.    Problem Relation Age of Onset  . Coronary artery disease    . Hypertension Mother    Physical Exam: Blood pressure 103/63, pulse 69, temperature 97.6 F (36.4 C), temperature source Oral, resp. rate 22, height 5\' 5"  (1.651 m), weight 165 lb (74.844 kg), SpO2 100.00%. General: Well developed, well nourished, female in mild distress. Head: Normocephalic, atraumatic, sclera non-icteric, no xanthomas, nares are without discharge.  Neck: bilateral carotid bruits. Mildly delayed carotid upstrokes. No obvious  JVD (hard to see) No thyromegally Lungs: Rales bilaterally without wheezes or rhonchi.  Heart: Regular rate and rhythm with S1 S2. 2/6 AS murmur,S2 moderately depressed but no rubs, or gallops appreciated. Abdomen: Soft, non-tender, slightly distended with normoactive bowel sounds. No rebound/guarding. No obvious abdominal masses. Msk:  Strength and tone appear normal for age. No joint deformities or effusions, no spine or costo-vertebral angle tenderness. Extremities: No clubbing or cyanosis. No edema.  Distal pedal pulses are 2+ and equal bilaterally. Neuro: Alert and oriented X 3. Moves all extremities spontaneously. No focal deficits noted. Psych:  Responds to questions appropriately with a normal affect. Skin: No rashes or lesions noted  Review of Systems: She has had a low-grade fever at times but not in the last 2-3 weeks. She has occasional reflux symptoms but denies hematemesis, hemoptysis or melena. She has occasional musculoskeletal aches and pains. She has not had chest pain. She has not had palpitations. She has had fatigue. Because of the upper respiratory illness, her activity level has been low. All other systems reviewed and are otherwise negative except as noted above.  Labs:  Lab Results  Component Value Date   WBC 6.1 05/28/2011   HGB 12.0 05/28/2011   HCT 35.7* 05/28/2011     MCV 98.6 05/28/2011   PLT 202 05/28/2011     Lab 05/28/11 1445  NA 140  K 3.5  CL 103  CO2 25  BUN 8  CREATININE 0.66  CALCIUM 8.3*  PROT --  BILITOT --  ALKPHOS --  ALT --  AST --  GLUCOSE 103*    Basename 05/28/11 1446  CKTOTAL --  CKMB --  TROPONINI <0.30    Pro B Natriuretic peptide (BNP)  Date/Time Value Range Status  05/28/2011  2:45 PM 2735.0* 0-125 (pg/mL) Final  05/01/2011 11:50 PM 1936.0* 0-125 (pg/mL) Final    Radiology/Studies:  Dg Chest 2 View  05/28/2011  *RADIOLOGY REPORT*  Clinical Data: Shortness of breath.  Syncope.  CHEST - 2 VIEW  Comparison: 05/02/2011  Findings:  There is moderate cardiac enlargement.  There is pulmonary venous congestion.  No effusions or airspace consolidation.  IMPRESSION:  1.  Cardiac enlargement and pulmonary venous congestion.  Original Report Authenticated By: Rosealee Albee, M.D.    EKG: SINUS RHYTHM ~ normal P axis, V-rate 50- 99 PROBABLE LEFT ATRIAL ABNORMALITY ~ P >84mS, <-0.63mV V1 NONSPECIFIC INTRAVENTRICULAR CONDUCTION DELAY ~ QRSd >149mS, not LBBB/RBBB LOW VOLTAGE IN FRONTAL LEADS ~ all frontal leads <0.9mV LEFT VENTRICULAR HYPERTROPHY ~ R56L/RISIII/S12R56/S3RL & LAA/LAD CONSIDER ANTERIOR INFARCT ~ diminished R <0.62mV in V4  ASSESSMENT: 1. Syncope x 2 2. Moderate to severe AS (mean gradient, AVA 0.73 cm2)         --by echo 2/12. EF 45-50%. Mild to mod MS, mod TR, PASP 59 3. 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 Recent URI  5. H/o polysubstance abuse 6. Chronic diastolic HF 7. Secondary pulmonary HTN 8. Recent URI  Signed, Theodore Demark PA-C 05/28/2011, 4:24 PM  Attending: Patient seen and examined with Theodore Demark PA-C. We discussed all aspects of the encounter. I agree with the assessment and plan as stated above.   PLAN/DISCUSSION: Despite mildly elevated BNP, I suspect that syncope related to overdiuresis in the setting of significant AS. Will admit to tele. Check orthostatics. Hydrate gently. Check repeat  echo. If w/u negative consider outpatient event monitor. Check UDS.   Truman Hayward 5:51 PM

## 2011-05-28 NOTE — Plan of Care (Signed)
Problem: Phase I Progression Outcomes Goal: EF % per last Echo/documented,Core Reminder form on chart Outcome: Completed/Met Date Met:  05/28/11 35-40% in 2009

## 2011-05-28 NOTE — ED Provider Notes (Signed)
History     CSN: [id number]  Arrival date & time 05/28/11  1259   First MD Initiated Contact with Patient 05/28/11 1415      Chief Complaint  Patient presents with  . Shortness of Breath    (Consider location/radiation/quality/duration/timing/severity/associated sxs/prior treatment) Patient is a 51 y.o. female presenting with shortness of breath. The history is provided by the patient.  Shortness of Breath  Associated symptoms include shortness of breath.  She has been having shortness of breath for the last 2 weeks and symptoms are getting worse. Symptoms are severe. It is worse if she lays flat and worse if she tries to exert herself. She did have some leg swelling but that improved after she took an extra dose of her furosemide. She's had a cough which is mostly nonproductive, however, when she does raise some sputum, it is clear. She denies fever or sweats. She initially had chills but does not have them any more. Her dyspnea is somewhat better if she leans forward. She has had some intermittent, sharp, stabbing pains in the left inframammary area. These pains are not affected by breathing but are worsened by twisting and turning. She denies nausea, vomiting, arthralgias and myalgias. She denies tobacco use denies sick contacts. She did not get a flu shot this year.  Past Medical History  Diagnosis Date  . CHF (congestive heart failure)   . Pulmonary nodule, left     f/u chest CT 2/12: interval clearing of RUL air space nodule, borderline enlarged mediastinal lymph nodes stable, pul. arterial HTN  . Emphysema   . Ovarian cyst     ovarian cystic mass  . Cocaine abuse   . Alcohol abuse   . Hypertension     pulmonary  . 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 Mitral stenosis   . Nonischemic cardiomyopathy     a. cath 6/08: pLAD 30%, mLAD 30%, oCFX 40%, dCVS 40%; OM 50%; dRCA 40%;    b. echo 06/01/10: severe LVH; EF 45-50%; inf HK; mild to mod AS (mean 30), mod. AI;  mild to mod MS; mod MR; severe LAE, mod TR, mod PR, PASP 59;  c.  w/u for Amyloid done in past  . Tubal pregnancy     Hx  . Cannabis abuse   . Fibroids     abd. CT 2/12: large fibroid uterus  . Personal history of noncompliance with medical treatment, presenting hazards to health   . Pulmonary HTN     Past Surgical History  Procedure Date  . Tubal ligation     Family History  Problem Relation Age of Onset  . Coronary artery disease    . Hypertension Mother     History  Substance Use Topics  . Smoking status: Former Smoker -- 1.0 packs/day for 10 years    Types: Cigarettes  . Smokeless tobacco: Not on file  . Alcohol Use: 0.6 oz/week    1 Shots of liquor per week    OB History    Grav Para Term Preterm Abortions TAB SAB Ect Mult Living                  Review of Systems  Respiratory: Positive for shortness of breath.   All other systems reviewed and are negative.    Allergies  Penicillins  Home Medications   Current Outpatient Rx  Name Route Sig Dispense Refill  . AMLODIPINE BESYLATE 10 MG PO TABS  TAKE ONE TABLET BY MOUTH  EVERY DAY 30 tablet 6  . CARVEDILOL 25 MG PO TABS Oral Take 1 tablet (25 mg total) by mouth 2 (two) times daily with a meal. 60 tablet 12  . CETIRIZINE HCL 10 MG PO TABS Oral Take 10 mg by mouth daily.    . ENALAPRIL MALEATE 20 MG PO TABS  TAKE ONE TABLET BY MOUTH TWICE DAILY 60 tablet 12  . FUROSEMIDE 40 MG PO TABS  Take two tablets twice daily 120 tablet 12  . POTASSIUM CHLORIDE 20 MEQ PO PACK Oral Take 20 mEq by mouth 2 (two) times daily. 60 tablet 12    BP 90/71  Pulse 78  Temp(Src) 97.6 F (36.4 C) (Oral)  Resp 24  Ht 5\' 5"  (1.651 m)  Wt 165 lb (74.844 kg)  BMI 27.46 kg/m2  SpO2 100%  Physical Exam  Nursing note and vitals reviewed.  51 year old female who is resting comfortably and in no acute distress. Vital signs are significant for mild tachypnea with respiratory rate of 24. Oxygen saturation is 96% which is normal. Head  is normocephalic and atraumatic. PERRLA, EOMI. Oropharynx is clear. Neck is supple without adenopathy, JVD, or bruit. Back is nontender. Lungs have a slightly prolonged exhalation phase and slight wheezing is noted on forced exhalation. No rales or rhonchi are heard. There is no chest wall tenderness. Heart has regular rate and rhythm without murmur. Abdomen is soft, flat, nontender without masses or hepatosplenomegaly. Extremities have no cyanosis or edema, full range of motion is present. Skin is warm and dry without rash. Neurologic: Mental status is normal, cranial nerves are intact, there no focal motor or sensory deficits. Psychiatric: No abnormalities of mood or affect.  ED Course  Procedures (including critical care time)  Results for orders placed during the hospital encounter of 05/28/11  CBC      Component Value Range   WBC 6.1  4.0 - 10.5 (K/uL)   RBC 3.62 (*) 3.87 - 5.11 (MIL/uL)   Hemoglobin 12.0  12.0 - 15.0 (g/dL)   HCT 40.9 (*) 81.1 - 46.0 (%)   MCV 98.6  78.0 - 100.0 (fL)   MCH 33.1  26.0 - 34.0 (pg)   MCHC 33.6  30.0 - 36.0 (g/dL)   RDW 91.4  78.2 - 95.6 (%)   Platelets 202  150 - 400 (K/uL)  DIFFERENTIAL      Component Value Range   Neutrophils Relative 66  43 - 77 (%)   Neutro Abs 4.0  1.7 - 7.7 (K/uL)   Lymphocytes Relative 16  12 - 46 (%)   Lymphs Abs 1.0  0.7 - 4.0 (K/uL)   Monocytes Relative 17 (*) 3 - 12 (%)   Monocytes Absolute 1.0  0.1 - 1.0 (K/uL)   Eosinophils Relative 0  0 - 5 (%)   Eosinophils Absolute 0.0  0.0 - 0.7 (K/uL)   Basophils Relative 1  0 - 1 (%)   Basophils Absolute 0.0  0.0 - 0.1 (K/uL)  BASIC METABOLIC PANEL      Component Value Range   Sodium 140  135 - 145 (mEq/L)   Potassium 3.5  3.5 - 5.1 (mEq/L)   Chloride 103  96 - 112 (mEq/L)   CO2 25  19 - 32 (mEq/L)   Glucose, Bld 103 (*) 70 - 99 (mg/dL)   BUN 8  6 - 23 (mg/dL)   Creatinine, Ser 2.13  0.50 - 1.10 (mg/dL)   Calcium 8.3 (*) 8.4 - 10.5 (mg/dL)   GFR calc  non Af Amer >90  >90  (mL/min)   GFR calc Af Amer >90  >90 (mL/min)  PRO B NATRIURETIC PEPTIDE      Component Value Range   Pro B Natriuretic peptide (BNP) 2735.0 (*) 0 - 125 (pg/mL)  TROPONIN I      Component Value Range   Troponin I <0.30  <0.30 (ng/mL)   Dg Chest 2 View  05/28/2011  *RADIOLOGY REPORT*  Clinical Data: Shortness of breath.  Syncope.  CHEST - 2 VIEW  Comparison: 05/02/2011  Findings:  There is moderate cardiac enlargement.  There is pulmonary venous congestion.  No effusions or airspace consolidation.  IMPRESSION:  1.  Cardiac enlargement and pulmonary venous congestion.  Original Report Authenticated By: Rosealee Albee, M.D.   Dg Chest 2 View  05/02/2011  *RADIOLOGY REPORT*  Clinical Data: Altered level of consciousness, cough, history of CHF, emphysema, hypertension  CHEST - 2 VIEW  Comparison: 07/17/2008  Findings: Enlargement of cardiac silhouette with pulmonary vascular congestion. Mediastinal contours normal. Minimal chronic accentuation of interstitial markings. No acute failure or consolidation. No pleural effusion or pneumothorax. Bones unremarkable.  IMPRESSION: Enlargement of cardiac silhouette with pulmonary vascular congestion. No acute abnormalities.  Original Report Authenticated By: Lollie Marrow, M.D.   Ct Head Wo Contrast  05/02/2011  *RADIOLOGY REPORT*  Clinical Data: Altered level of consciousness; confusion.  CT HEAD WITHOUT CONTRAST  Technique:  Contiguous axial images were obtained from the base of the skull through the vertex without contrast.  Comparison: None.  Findings: There is no evidence of acute infarction, mass lesion, or intra- or extra-axial hemorrhage on CT.  The posterior fossa, including the cerebellum, brainstem and fourth ventricle, is within normal limits.  The third and lateral ventricles, and basal ganglia are unremarkable in appearance.  The cerebral hemispheres are symmetric in appearance, with normal gray- white differentiation.  No mass effect or midline  shift is seen.  There is no evidence of fracture; visualized osseous structures are unremarkable in appearance.  The visualized portions of the orbits are within normal limits.  There is partial opacification of the left maxillary sinus; the remaining paranasal sinuses and mastoid air cells are well-aerated.  No significant soft tissue abnormalities are seen.  IMPRESSION:  1.  No acute intracranial pathology seen on CT. 2.  Partial opacification of the left maxillary sinus.  Original Report Authenticated By: Tonia Ghent, M.D.      Date: 05/28/2011  Rate: 83  Rhythm: normal sinus rhythm and premature atrial contractions (PAC)  QRS Axis: right  Intervals: normal and QT prolonged  ST/T Wave abnormalities: nonspecific T wave changes  Conduction Disutrbances:nonspecific intraventricular conduction delay  Narrative Interpretation: Low voltage, nonspecific intraventricular conduction delay prolonged QT interval, poor R-wave progression, right axis deviation. When compared with ECG of 05/01/2011, no significant changes are seen.  Old EKG Reviewed: unchanged  She was given an albuterol nebulizer treatment with minimal if any improvement. BNP is up over baseline, so she is given a dose of Lasix. She states that although it her Lasix has been prescribed as 80 mg twice a day, she is actually been taking 80 mg 3 times a day and still has been having aggressive dyspnea. Therefore, I believe that she would benefit from. Of IV diuretics. Consultation is obtained with the cardiologist on call for Dr. Jens Som who will come and evaluate the patient for possible admission.  1. CHF (congestive heart failure)   2. Cough       MDM  Cough and dyspnea which are most likely a viral bronchitis. Chest x-ray will be obtained to rule out pneumonia. EKG and cardiac markers will be obtained to rule out occult cardiac injury. She will be given albuterol and Atrovent nebulizer treatment and reassessed.        Dione Booze, MD 05/28/11 1700

## 2011-05-28 NOTE — ED Notes (Signed)
Pt ambulated to restroom, when she returned her breathing was labored and assumed a tripod posture over bedside table.  Pt put on 2lt O2 per physicians recommendation

## 2011-05-29 LAB — COMPREHENSIVE METABOLIC PANEL
AST: 15 U/L (ref 0–37)
Albumin: 3.5 g/dL (ref 3.5–5.2)
BUN: 7 mg/dL (ref 6–23)
Calcium: 8.5 mg/dL (ref 8.4–10.5)
Creatinine, Ser: 0.6 mg/dL (ref 0.50–1.10)
GFR calc non Af Amer: >90 mL/min (ref >90)

## 2011-05-29 LAB — BASIC METABOLIC PANEL
BUN: 8 mg/dL (ref 6–23)
BUN: 8 mg/dL (ref 6–23)
Chloride: 103 mEq/L (ref 96–112)
Creatinine, Ser: 0.73 mg/dL (ref 0.50–1.10)
Creatinine, Ser: 0.68 mg/dL (ref 0.50–1.10)
GFR calc Af Amer: >90 mL/min (ref >90)
GFR calc Af Amer: >90 mL/min (ref >90)
GFR calc non Af Amer: >90 mL/min (ref >90)
GFR calc non Af Amer: >90 mL/min (ref >90)
Glucose, Bld: 93 mg/dL (ref 70–99)

## 2011-05-29 LAB — CALCIUM, IONIZED: Calcium, Ion: 1.1 mmol/L — ABNORMAL LOW (ref 1.12–1.32)

## 2011-05-29 LAB — CARDIAC PANEL(CRET KIN+CKTOT+MB+TROPI)
CK, MB: 2.5 ng/mL (ref 0.3–4.0)
CK, MB: 2.8 ng/mL (ref 0.3–4.0)
Total CK: 92 U/L (ref 7–177)
Troponin I: <0.3 ng/mL (ref <0.30)
Troponin I: <0.3 ng/mL (ref <0.30)

## 2011-05-29 LAB — PHOSPHORUS: Phosphorus: 2.8 mg/dL (ref 2.3–4.6)

## 2011-05-29 LAB — MAGNESIUM: Magnesium: 1.6 mg/dL (ref 1.5–2.5)

## 2011-05-29 LAB — TSH: TSH: 2.261 u[IU]/mL (ref 0.350–4.500)

## 2011-05-29 MED ORDER — GUAIFENESIN-DM 100-10 MG/5ML PO SYRP
5.0000 mL | ORAL_SOLUTION | ORAL | Status: DC | PRN
  Administered 2011-05-29 – 2011-06-07 (×5): 5 mL via ORAL
  Filled 2011-05-29 (×5): qty 5

## 2011-05-29 MED ORDER — POTASSIUM CHLORIDE CRYS ER 20 MEQ PO TBCR
40.0000 meq | EXTENDED_RELEASE_TABLET | Freq: Once | ORAL | Status: AC
  Administered 2011-05-29: 40 meq via ORAL
  Filled 2011-05-29: qty 2

## 2011-05-29 MED ORDER — CARVEDILOL 25 MG PO TABS
25.0000 mg | ORAL_TABLET | Freq: Two times a day (BID) | ORAL | Status: DC
  Administered 2011-05-29 – 2011-06-11 (×27): 25 mg via ORAL
  Administered 2011-06-11: 18:00:00 via ORAL
  Filled 2011-05-29 (×31): qty 1

## 2011-05-29 MED ORDER — ALBUTEROL SULFATE (5 MG/ML) 0.5% IN NEBU
2.5000 mg | INHALATION_SOLUTION | Freq: Four times a day (QID) | RESPIRATORY_TRACT | Status: DC | PRN
  Administered 2011-05-29 (×3): 2.5 mg via RESPIRATORY_TRACT
  Filled 2011-05-29 (×3): qty 0.5

## 2011-05-29 NOTE — Progress Notes (Signed)
Patient ID: [name], female   DOB: 14-Sep-1960, 51 y.o.   MRN: [id number]  On-call cardiology note  51 y/o female with a PMH CHF with LVEF of 35-40% admitted for CHF exacerbation last night.  She developed 18 beats of asymptomatic non-sustained VT.  Patient not aware of this.  Currently, she he hemodynamically stable.  He vital signs are as follows:  Laying:  BP110/67  HR 77bpm Sitting:  BP 119/75 HR 81 bpm Standing:  BP 121/74 HR 79bpm  She is on coreg at home, but she has not received Coreg since admission. She will receive her home dose of Coreg 25 mg now, and we will obtain chemistries, magnesium, phosphorous and calcium to check for electrolytes abnormalities.  Gemma Payor, M.D.

## 2011-05-29 NOTE — Progress Notes (Signed)
Pt had 18 beats of V-Tach, rn checked on pt, pt said she felt SOB, pt appeared mildly anxious, Vital signs were stable. T-97.6, P-87, R-20, BP-100/78, O2-100% on 3l. Md on-call was notified who said he will come and check on pt. Will continue to monitor pt----- Guerin Lashomb, D, rn

## 2011-05-29 NOTE — Progress Notes (Signed)
Lab called with a potassium level of 2.7 on pt, MD on call was notified, orders received.-- Tobi Leinweber, D.rn

## 2011-05-29 NOTE — Progress Notes (Signed)
SUBJECTIVE: The patient is doing reasonably well today.  No further syncope.  Her SOB is improved.  She denies CP.    Marland Kitchen albuterol  2.5 mg Nebulization Once  . aspirin EC  81 mg Oral Daily  . carvedilol  25 mg Oral BID WC  . enoxaparin  40 mg Subcutaneous Q24H  . furosemide  80 mg Intravenous Once  . ipratropium  0.5 mg Nebulization Once  . potassium chloride  40 mEq Oral Once      . sodium chloride 75 mL/hr at 05/28/11 2300  . DISCONTD: sodium chloride      OBJECTIVE: Physical Exam: Filed Vitals:   05/29/11 0626 05/29/11 0630 05/29/11 0632 05/29/11 0635  BP: 110/67 119/75 121/74 120/72  Pulse: 77 81 79 45  Temp: 97.9 F (36.6 C)     TempSrc:      Resp: 24     Height:      Weight:      SpO2: 96% 97% 94% 100%    Intake/Output Summary (Last 24 hours) at 05/29/11 1150 Last data filed at 05/29/11 0900  Gross per 24 hour  Intake   1125 ml  Output    800 ml  Net    325 ml    Telemetry reveals sinus rhythm NSVT (110 bmp) x 1,  Occasional PVCS  GEN- The patient is well appearing, alert and oriented x 3 today.   Head- normocephalic, atraumatic Eyes-  Sclera clear, conjunctiva pink Ears- hearing intact Oropharynx- clear Neck- supple, no JVP Lymph- no cervical lymphadenopathy Lungs- diffuse expiratory wheezes, nl WOB Heart- Regular rate and rhythm, 2/6 SEM LUSB (late peaking) GI- soft, NT, ND, + BS Extremities- no clubbing, cyanosis, or edema  LABS: Basic Metabolic Panel:  Basename 05/29/11 0832 05/29/11 0500  NA 137 138  K 2.9* 2.7*  CL 100 101  CO2 23 26  GLUCOSE 115* 93  BUN 7 8  CREATININE 0.60 0.73  CALCIUM 8.5 8.6  MG 1.6 --  PHOS 2.8 --   Liver Function Tests:  Basename 05/29/11 0832  AST 15  ALT 5  ALKPHOS 65  BILITOT 0.8  PROT 6.9  ALBUMIN 3.5   No results found for this basename: LIPASE:2,AMYLASE:2 in the last 72 hours CBC:  Basename 05/28/11 2110 05/28/11 1445  WBC 6.8 6.1  NEUTROABS -- 4.0  HGB 12.9 12.0  HCT 37.6 35.7*  MCV 96.9  98.6  PLT 173 202   Cardiac Enzymes:  Basename 05/29/11 0833 05/29/11 0329 05/28/11 2109  CKTOTAL 92 88 91  CKMB 2.8 2.5 2.3  CKMBINDEX -- -- --  TROPONINI <0.30 <0.30 <0.30  Thyroid Function Tests:  Basename 05/28/11 2110  TSH 2.261  T4TOTAL --  T3FREE --  THYROIDAB --    ASSESSMENT AND PLAN:  Active Problems:  Systolic and diastolic CHF, chronic  Syncope  Aortic stenosis  1.  Syncope- unclear etiology, though history is concerning for cardiac syncope.  Awaiting echo.  Further workup pending results.  She has slow NSVT (110 bpm) this am in setting of hypokalemia.  We will replete K and follow on telemetry.  2.  Aortic stenosis- moderate to severe by prior echo (a year ago).  Will repeat echo at this time.  3. Recent URI- wheezing on exam,  Will treat with bronchodilators  4. Diastolic dysfunction- stable  Hillis Range, MD 05/29/2011 11:50 AM

## 2011-05-30 ENCOUNTER — Inpatient Hospital Stay (HOSPITAL_COMMUNITY): Payer: Medicaid Other

## 2011-05-30 DIAGNOSIS — I38 Endocarditis, valve unspecified: Secondary | ICD-10-CM

## 2011-05-30 DIAGNOSIS — R609 Edema, unspecified: Secondary | ICD-10-CM

## 2011-05-30 LAB — BASIC METABOLIC PANEL
CO2: 24 mEq/L (ref 19–32)
Calcium: 8.8 mg/dL (ref 8.4–10.5)
Creatinine, Ser: 0.57 mg/dL (ref 0.50–1.10)
GFR calc non Af Amer: >90 mL/min (ref >90)
Glucose, Bld: 153 mg/dL — ABNORMAL HIGH (ref 70–99)

## 2011-05-30 MED ORDER — FUROSEMIDE 10 MG/ML IJ SOLN
20.0000 mg | Freq: Once | INTRAMUSCULAR | Status: AC
  Administered 2011-05-30: 20 mg via INTRAVENOUS
  Filled 2011-05-30: qty 2

## 2011-05-30 MED ORDER — POTASSIUM CHLORIDE CRYS ER 20 MEQ PO TBCR
40.0000 meq | EXTENDED_RELEASE_TABLET | Freq: Once | ORAL | Status: AC
  Administered 2011-05-30: 40 meq via ORAL
  Filled 2011-05-30: qty 2

## 2011-05-30 MED ORDER — ALBUTEROL SULFATE (5 MG/ML) 0.5% IN NEBU
2.5000 mg | INHALATION_SOLUTION | RESPIRATORY_TRACT | Status: DC | PRN
  Administered 2011-05-30 – 2011-05-31 (×5): 2.5 mg via RESPIRATORY_TRACT
  Filled 2011-05-30 (×5): qty 0.5

## 2011-05-30 NOTE — Progress Notes (Signed)
  Echocardiogram 2D Echocardiogram has been performed.  Cathie Beams Deneen 05/30/2011, 2:37 PM

## 2011-05-30 NOTE — Progress Notes (Signed)
SUBJECTIVE: The patient reports more SOB overnight.  + wheezing today.  No further syncope.    She denies CP.    Marland Kitchen aspirin EC  81 mg Oral Daily  . carvedilol  25 mg Oral BID WC  . enoxaparin  40 mg Subcutaneous Q24H  . furosemide  20 mg Intravenous Once  . potassium chloride  40 mEq Oral Once      OBJECTIVE: Physical Exam: Filed Vitals:   05/30/11 0530 05/30/11 0531 05/30/11 0532 05/30/11 0535  BP: 120/73 121/48 134/93 134/87  Pulse: 85 84 85 81  Temp: 98.9 F (37.2 C)     TempSrc:      Resp: 22     Height:      Weight: 177 lb 14.6 oz (80.7 kg)     SpO2: 95%       Intake/Output Summary (Last 24 hours) at 05/30/11 1146 Last data filed at 05/30/11 0609  Gross per 24 hour  Intake    480 ml  Output    600 ml  Net   -120 ml    Telemetry reveals sinus rhythm NSVT    GEN- The patient is well appearing, alert and oriented x 3 today.   Head- normocephalic, atraumatic Eyes-  Sclera clear, conjunctiva pink Ears- hearing intact Oropharynx- clear Neck- supple, no JVP Lymph- no cervical lymphadenopathy Lungs- diffuse expiratory wheezes, nl WOB Heart- Regular rate and rhythm, 2/6 SEM LUSB (late peaking) GI- soft, NT, ND, + BS Extremities- no clubbing, cyanosis, trace edema  LABS: Basic Metabolic Panel:  Basename 05/30/11 0819 05/29/11 1419 05/29/11 0832  NA 138 137 --  K 3.1* 3.4* --  CL 102 103 --  CO2 24 25 --  GLUCOSE 153* 113* --  BUN 8 8 --  CREATININE 0.57 0.68 --  CALCIUM 8.8 8.5 --  MG -- -- 1.6  PHOS -- -- 2.8   Liver Function Tests:  Basename 05/29/11 0832  AST 15  ALT 5  ALKPHOS 65  BILITOT 0.8  PROT 6.9  ALBUMIN 3.5   No results found for this basename: LIPASE:2,AMYLASE:2 in the last 72 hours CBC:  Basename 05/28/11 2110 05/28/11 1445  WBC 6.8 6.1  NEUTROABS -- 4.0  HGB 12.9 12.0  HCT 37.6 35.7*  MCV 96.9 98.6  PLT 173 202   Cardiac Enzymes:  Basename 05/29/11 0833 05/29/11 0329 05/28/11 2109  CKTOTAL 92 88 91  CKMB 2.8 2.5 2.3    CKMBINDEX -- -- --  TROPONINI <0.30 <0.30 <0.30  Thyroid Function Tests:  Basename 05/28/11 2110  TSH 2.261  T4TOTAL --  T3FREE --  THYROIDAB --    ASSESSMENT AND PLAN:  Active Problems:  Systolic and diastolic CHF, chronic  Syncope  Aortic stenosis  1.  Syncope- unclear etiology, though history is concerning for cardiac syncope.  Awaiting echo.  Further workup pending results.  She has slow NSVT (110 bpm) this am in setting of hypokalemia.  We will replete K and follow on telemetry.  2.  Aortic stenosis- moderate to severe by prior echo (a year ago).  Will repeat echo at this time. May benefit from RHC/LHC depending on results  3. Recent URI- wheezing on exam,  Will treat with bronchodilators Repeat CXR  4. Diastolic dysfunction- stable, given worsening SOB, will give IV lasix x 1 today  Hillis Range, MD 05/30/2011 11:46 AM

## 2011-05-30 NOTE — Progress Notes (Signed)
Patient complaining of increasing SOB.  Patient was walking back from bathroom however she states SOB has gotten worse before going to bathroom.  Patient is wheezing bilaterally, oxygen sat 93% on 3L.  Patient needing to sit and lean forward to help with breathing.  Dr. Antoine Poche made aware and increased frequency of nebulizers and ordered one time dose of lasix.  See new orders.  Will continue to monitor patient.

## 2011-05-31 DIAGNOSIS — I509 Heart failure, unspecified: Secondary | ICD-10-CM

## 2011-05-31 LAB — BASIC METABOLIC PANEL
BUN: 9 mg/dL (ref 6–23)
Calcium: 9 mg/dL (ref 8.4–10.5)
GFR calc non Af Amer: >90 mL/min (ref >90)
Glucose, Bld: 98 mg/dL (ref 70–99)

## 2011-05-31 MED ORDER — ALBUTEROL SULFATE (5 MG/ML) 0.5% IN NEBU
2.5000 mg | INHALATION_SOLUTION | Freq: Four times a day (QID) | RESPIRATORY_TRACT | Status: DC
  Administered 2011-05-31 – 2011-06-02 (×9): 2.5 mg via RESPIRATORY_TRACT
  Filled 2011-05-31 (×9): qty 0.5

## 2011-05-31 MED ORDER — MOXIFLOXACIN HCL 400 MG PO TABS
400.0000 mg | ORAL_TABLET | Freq: Every day | ORAL | Status: DC
  Administered 2011-05-31 – 2011-06-08 (×9): 400 mg via ORAL
  Filled 2011-05-31 (×9): qty 1

## 2011-05-31 MED ORDER — POTASSIUM CHLORIDE CRYS ER 20 MEQ PO TBCR: 80.0000 meq | EXTENDED_RELEASE_TABLET | Freq: Two times a day (BID) | ORAL | Status: DC

## 2011-05-31 NOTE — Significant Event (Signed)
Pt. HAD 7 beats of V-Tach. Then later has 8 beats of V-tach and was asymptomatic. Pt had previously had 12 beats on 05/30/11. Called doctor on-call and spoke to Gwendalyn Ege MD to advise of info. He stated no intervention needed, just to keep monitoring patient. Will continue to monitor patient.

## 2011-05-31 NOTE — Plan of Care (Signed)
Problem: Food- and Nutrition-Related Knowledge Deficit (NB-1.1) Goal: Nutrition education Formal process to instruct or train a patient/client in a skill or to impart knowledge to help patients/clients voluntarily manage or modify food choices and eating behavior to maintain or improve health.  Outcome: Completed/Met Date Met:  05/31/11 Patient provided with CHF packet. Topics included understanding how to read a food label, importance of following a low sodium diet, and ways to avoid high sodium foods. Patients questions were answered. Expect good compliance with diet recommendations.  [name] Pager # [phone]     [name] [phone]

## 2011-05-31 NOTE — Progress Notes (Signed)
SUBJECTIVE: [name] is a 51 year old female with a history of aortic stenosis. She was admitted on February 8 after having an episode of syncope. Episode of syncope occurred when she was sitting down on her couch watching television. There is no exertion. She did not have any warning with this episode of syncope.  She's had a bad upper respiratory tract infection and has had lots of wheezing. She's coughing up green sputum.  Echocardiogram performed yesterday reveals a severely depressed left ventricular systolic function with an ejection fraction of 20-25%. There is moderate aortic stenosis with moderate to severe aortic regurgitation.  Her pulmonary artery pressures were moderately elevated with an estimated PA pressure of 49 mmHg.    Marland Kitchen aspirin EC  81 mg Oral Daily  . carvedilol  25 mg Oral BID WC  . enoxaparin  40 mg Subcutaneous Q24H  . furosemide  20 mg Intravenous Once  . potassium chloride  40 mEq Oral Once      OBJECTIVE: Physical Exam: Filed Vitals:   05/31/11 0453 05/31/11 0500 05/31/11 0503 05/31/11 0506  BP: 115/71 127/79 125/89 115/77  Pulse: 71 82 83 86  Temp: 98.3 F (36.8 C)     TempSrc: Oral     Resp: 18     Height:      Weight: 172 lb 9.6 oz (78.291 kg)     SpO2: 94%       Intake/Output Summary (Last 24 hours) at 05/31/11 0746 Last data filed at 05/31/11 0042  Gross per 24 hour  Intake    240 ml  Output    725 ml  Net   -485 ml    Telemetry reveals sinus rhythm NSVT    GEN- The patient is well appearing, alert and oriented x 3 today.   Head- normocephalic, atraumatic Eyes-  Sclera clear, conjunctiva pink Ears- hearing intact Oropharynx- clear Neck- supple, no JVP Lymph- no cervical lymphadenopathy Lungs- diffuse tight expiratory wheezing bilaterally. Heart- Regular rate and rhythm, 2/6 SEM LUSB (late peaking) her heart is very difficult to hear over her wheezing. GI- soft, NT, ND, + BS Extremities- no clubbing, cyanosis, trace  edema  LABS: Basic Metabolic Panel:  Basename 05/30/11 0819 05/29/11 1419 05/29/11 0832  NA 138 137 --  K 3.1* 3.4* --  CL 102 103 --  CO2 24 25 --  GLUCOSE 153* 113* --  BUN 8 8 --  CREATININE 0.57 0.68 --  CALCIUM 8.8 8.5 --  MG -- -- 1.6  PHOS -- -- 2.8   Liver Function Tests:  Basename 05/29/11 0832  AST 15  ALT 5  ALKPHOS 65  BILITOT 0.8  PROT 6.9  ALBUMIN 3.5   No results found for this basename: LIPASE:2,AMYLASE:2 in the last 72 hours CBC:  Basename 05/28/11 2110 05/28/11 1445  WBC 6.8 6.1  NEUTROABS -- 4.0  HGB 12.9 12.0  HCT 37.6 35.7*  MCV 96.9 98.6  PLT 173 202   Cardiac Enzymes:  Basename 05/29/11 0833 05/29/11 0329 05/28/11 2109  CKTOTAL 92 88 91  CKMB 2.8 2.5 2.3  CKMBINDEX -- -- --  TROPONINI <0.30 <0.30 <0.30  Thyroid Function Tests:  Basename 05/28/11 2110  TSH 2.261  T4TOTAL --  T3FREE --  THYROIDAB --    ASSESSMENT AND PLAN:  Active Problems:  Systolic and diastolic CHF, chronic  Syncope  Aortic stenosis  1.  Syncope- unclear etiology, though history is concerning for cardiac syncope.  Her symptoms occurred initially at rest. This is somewhat concerning for  an arrhythmia.  She does have significant LV dysfunction and also has severe aortic valve disease. She will likely need a cardiac catheterization. She may also need an EP study.   2.  Aortic stenosis- moderate to severe by prior echo (a year ago).  She'll need an right heart catheterization and left heart catheterization at some time. I would like to wait until her breathing is better.  3. Recent URI- wheezing on exam,  Will treat with bronchodilators.  We'll add Avelox 1 mg a day. Repeat CXR  4. Diastolic dysfunction- stable, given worsening SOB, will give IV lasix x 1 today  Elyn Aquas., MD 05/31/2011 7:46 AM

## 2011-05-31 NOTE — Progress Notes (Signed)
05/31/11 1445 UR Completed. Tera Mater, RN, BSN

## 2011-06-01 DIAGNOSIS — I428 Other cardiomyopathies: Secondary | ICD-10-CM

## 2011-06-01 DIAGNOSIS — I5021 Acute systolic (congestive) heart failure: Secondary | ICD-10-CM

## 2011-06-01 DIAGNOSIS — I351 Nonrheumatic aortic (valve) insufficiency: Secondary | ICD-10-CM

## 2011-06-01 MED ORDER — SODIUM CHLORIDE 0.9 % IJ SOLN
3.0000 mL | Freq: Two times a day (BID) | INTRAMUSCULAR | Status: DC
  Administered 2011-06-01 – 2011-06-03 (×5): 3 mL via INTRAVENOUS

## 2011-06-01 MED ORDER — POTASSIUM CHLORIDE CRYS ER 20 MEQ PO TBCR
40.0000 meq | EXTENDED_RELEASE_TABLET | Freq: Three times a day (TID) | ORAL | Status: DC
  Administered 2011-06-01 – 2011-06-03 (×11): 40 meq via ORAL
  Filled 2011-06-01 (×16): qty 2

## 2011-06-01 MED ORDER — FUROSEMIDE 10 MG/ML IJ SOLN
40.0000 mg | Freq: Once | INTRAMUSCULAR | Status: AC
  Administered 2011-06-01: 40 mg via INTRAVENOUS
  Filled 2011-06-01: qty 4

## 2011-06-01 MED ORDER — SODIUM CHLORIDE 0.9 % IJ SOLN
3.0000 mL | INTRAMUSCULAR | Status: DC | PRN
  Administered 2011-06-03: 3 mL via INTRAVENOUS

## 2011-06-01 MED ORDER — SODIUM CHLORIDE 0.9 % IV SOLN: 250.0000 mL | INTRAVENOUS | Status: DC | PRN

## 2011-06-01 NOTE — Progress Notes (Signed)
   CARE MANAGEMENT NOTE HEART FAILURE  06/01/2011   Patient:  [name],[name]   Account Number:  [id number]    Date Initiated:  05/31/2011  Documentation initiated by:  Tera Mater  Subjective/Objective Assessment:   51yo female admitted with SOB and LLE.  HX:  Empysema, CAD, CHF, Noncompliance, Substance Abuse.  Lives with family members.   Action/Plan:   Discharge planning for possible [facility] RN for HF management   Anticipated DC Date:  06/02/2011  Anticipated DC Plan:  HOME W HOME HEALTH SERVICES  DC Planning Services:  CM consult    Choice offered to / List presented to:          Status of service:  In process, will continue to follow  Medicare Important Message Given:  NA - LOS <3 / Initial given by admissions (If response is "NO", the following Medicare IM given date fields will be blank) Date Medicare IM Given:   Date Additional Medicare IM Given:    Discharge Disposition:    Per UR Regulation:  Reviewed for med. necessity/level of care/duration of stay  Comments:   06/01/11 1105 Spoke with pt. about HF education. Pt. did have [facility] with Heart Failure.  Pt. states she is able to obtain her medications from [facility] without difficulty.  She did state she did not have a weigh scale. She could verbalize the HF information on teachback. Tera Mater, RN, BSN [phone]   05/31/11 1445 UR Completed. Tera Mater, RN, BSN   Initial CM contact:  06/01/2011 11:05 AM  By:  Tera Mater Initial CSW contact:     By:      Is this an INP Readmission < 30 days:  N (If "YES" please see readmission information at the bottom of note)  Patient living status prior to this admission:  FAMILY  Patient setting prior to this admission:  HOME  Comorbid conditions being treated that contributed to this admission:  CHF, CAD, HTN  CHF Readmission Risk:  high  Type of patient education provided  HF Patient Education Assessment / Teach Back  HF Zone Tool / Magnet    Limit salt intake  Weigh daily     Patient education provided by  [facility]    Was referral made to [facility]:  N  Is the patient's PCP the same as attending:  N PCP:    Readmission < 30 Days If pt has HH, did they contact the agency before going to the ED:   Name of [facility] agency:    Was the follow-up physician visit scheduled prior to discharge:    Did the patient follow-up with the physician prior to this readmission:    Was there HF Clinic visits prior to readmission:    Were there ED visits between admissions:    Readmit type:    If unscheduled and related indicate reason for readmit:

## 2011-06-01 NOTE — Progress Notes (Signed)
Consent for Cardiac Catheterization has been signed, patient states she has no questions and/or concerns____________D. Manson Passey RN

## 2011-06-01 NOTE — Progress Notes (Signed)
IV restart attempted x 1 - IV therapy paged for restart per pt request not to have floor nurse do 2nd attempt. Margaretmary Bayley, RN

## 2011-06-01 NOTE — Progress Notes (Signed)
Attempted to restart IV, nurse did not see anything on patient and charge nurse attempted but was unsuccessful, patient has requested IV team to come and restart IV_________________________________D. Manson Passey RN

## 2011-06-01 NOTE — Progress Notes (Signed)
SUBJECTIVE: The patient reports stable SOB.  + wheezing today.  No further syncope.    She denies CP.    Marland Kitchen albuterol  2.5 mg Nebulization Q6H  . aspirin EC  81 mg Oral Daily  . carvedilol  25 mg Oral BID WC  . enoxaparin  40 mg Subcutaneous Q24H  . furosemide  40 mg Intravenous Once  . moxifloxacin  400 mg Oral q1800  . potassium chloride  40 mEq Oral TID AC & HS  . DISCONTD: potassium chloride  80 mEq Oral BID      OBJECTIVE: Physical Exam: Filed Vitals:   06/01/11 0815 06/01/11 0820 06/01/11 0825 06/01/11 0900  BP: 100/68 105/69 109/75   Pulse:    71  Temp:    98.2 F (36.8 C)  TempSrc:    Oral  Resp:    20  Height:      Weight:      SpO2:    99%    Intake/Output Summary (Last 24 hours) at 06/01/11 1341 Last data filed at 06/01/11 0934  Gross per 24 hour  Intake    585 ml  Output    300 ml  Net    285 ml    Telemetry reveals sinus rhythm, NSVT    GEN- The patient is well appearing, alert and oriented x 3 today.   Head- normocephalic, atraumatic Eyes-  Sclera clear, conjunctiva pink Ears- hearing intact Oropharynx- clear Neck- supple, no JVP Lymph- no cervical lymphadenopathy Lungs- diffuse expiratory wheezes, nl WOB Heart- Regular rate and rhythm, 2/6 SEM LUSB (late peaking), 1/6 diastolic murmur LUSB GI- soft, NT, ND, + BS Extremities- no clubbing, cyanosis, trace edema  LABS: Basic Metabolic Panel:  Basename 05/31/11 1000 05/30/11 0819  NA 139 138  K 3.1* 3.1*  CL 101 102  CO2 25 24  GLUCOSE 98 153*  BUN 9 8  CREATININE 0.57 0.57  CALCIUM 9.0 8.8  MG -- --  PHOS -- --   ASSESSMENT AND PLAN:  Active Problems:  Systolic and diastolic CHF, chronic  Syncope  Aortic stenosis  1.  Syncope- unclear etiology, though history is concerning for cardiac syncope (VT). EF is 20-25%.  Further workup is necessary at this time.  She has slow NSVT (110 bpm) this am in setting of hypokalemia.  We will replete K and follow on telemetry.  Will proceed with  RHC/LHC to assess hemodynamics, AI/AS, and cors tomorrow.  2.  Aortic stenosis/ AI- moderate AS with severe AI and decreased EF.  May require surgical evaluation.  Will plan to proceed with RHC and LHC tomorrow followed by surgical consultation.  3. Recent URI- wheezing on exam,  Will treat with bronchodilators and antibiotics  4. Acute systolic dysfunction- gentle diuresis  Hillis Range, MD 06/01/2011 1:41 PM

## 2011-06-02 LAB — BASIC METABOLIC PANEL
BUN: 9 mg/dL (ref 6–23)
Calcium: 9.2 mg/dL (ref 8.4–10.5)
Creatinine, Ser: 0.64 mg/dL (ref 0.50–1.10)
GFR calc Af Amer: >90 mL/min (ref >90)
GFR calc non Af Amer: >90 mL/min (ref >90)

## 2011-06-02 LAB — CBC
MCHC: 32.5 g/dL (ref 30.0–36.0)
Platelets: 164 10*3/uL (ref 150–400)
RDW: 13.9 % (ref 11.5–15.5)

## 2011-06-02 LAB — MAGNESIUM: Magnesium: 1.8 mg/dL (ref 1.5–2.5)

## 2011-06-02 MED ORDER — TRAMADOL HCL 50 MG PO TABS
50.0000 mg | ORAL_TABLET | Freq: Two times a day (BID) | ORAL | Status: DC | PRN
  Administered 2011-06-02: 50 mg via ORAL
  Filled 2011-06-02: qty 1

## 2011-06-02 MED ORDER — ALBUTEROL SULFATE (5 MG/ML) 0.5% IN NEBU
2.5000 mg | INHALATION_SOLUTION | Freq: Two times a day (BID) | RESPIRATORY_TRACT | Status: DC
  Administered 2011-06-03: 2.5 mg via RESPIRATORY_TRACT
  Filled 2011-06-02: qty 0.5

## 2011-06-02 MED ORDER — FUROSEMIDE 10 MG/ML IJ SOLN
40.0000 mg | Freq: Once | INTRAMUSCULAR | Status: AC
  Administered 2011-06-02: 40 mg via INTRAVENOUS
  Filled 2011-06-02: qty 4

## 2011-06-02 MED ORDER — FUROSEMIDE 10 MG/ML IJ SOLN
40.0000 mg | Freq: Two times a day (BID) | INTRAMUSCULAR | Status: DC
  Administered 2011-06-02 – 2011-06-03 (×2): 40 mg via INTRAVENOUS
  Filled 2011-06-02 (×4): qty 4

## 2011-06-02 NOTE — Progress Notes (Deleted)
Cardiologist, Dr. Charm Barges, [name] came in earlier this evening to assess patient, but patient talking to another doctor. Charm Barges came back to assess. Stated patient's bleeding was ok, no hematoma apparent, pulses 2+ bilaterally, and no intervention needed. Will continue to monitor patient.

## 2011-06-02 NOTE — Progress Notes (Signed)
Note written on 06/02/2011 at 1010pm was deleted because it was written on the wrong patient.

## 2011-06-02 NOTE — Progress Notes (Signed)
Pt quit smoking about a year ago and has remained tobacco free. Congratulated and encouraged pt to emain tobacco free. Reviewed relapse prevention strategies. Referred to 1-800 quit now for f/u and support. Discussed oral fixation substitutes, second hand smoke and in home smoking policy. Reviewed and gave pt Written education/contact information.

## 2011-06-02 NOTE — Progress Notes (Signed)
Informed by Sunny Schlein in cath lab that pt no longer on list for card. Cath today.  Dr.  Kirke Corin informed, and also pt states " get SOB when I lay flat, not sure if I can do it today."  Order received from MD for Low Na diet.  Amanda Pea, Charity fundraiser.

## 2011-06-02 NOTE — Progress Notes (Addendum)
SUBJECTIVE: The patient reports stable SOB.  + wheezing today.  No further syncope.    She denies CP. Severe orthopnea and anxiety. No able to lie flat at all.     . albuterol  2.5 mg Nebulization Q6H  . aspirin EC  81 mg Oral Daily  . carvedilol  25 mg Oral BID WC  . enoxaparin  40 mg Subcutaneous Q24H  . furosemide  40 mg Intravenous Once  . furosemide  40 mg Intravenous Once  . moxifloxacin  400 mg Oral q1800  . potassium chloride  40 mEq Oral TID AC & HS  . sodium chloride  3 mL Intravenous Q12H      OBJECTIVE: Physical Exam: Filed Vitals:   06/02/11 0647 06/02/11 0650 06/02/11 0651 06/02/11 0654  BP: 0/0 113/79 126/84 120/82  Pulse:  76 76 76  Temp:      TempSrc:      Resp:      Height:      Weight:      SpO2:        Intake/Output Summary (Last 24 hours) at 06/02/11 0816 Last data filed at 06/02/11 0700  Gross per 24 hour  Intake    634 ml  Output    825 ml  Net   -191 ml    Telemetry reveals sinus rhythm, NSVT    GEN- The patient is well appearing, alert and oriented x 3 today.   Head- normocephalic, atraumatic Eyes-  Sclera clear, conjunctiva pink Ears- hearing intact Oropharynx- clear Neck- supple, no JVP Lymph- no cervical lymphadenopathy Lungs- diffuse expiratory wheezes, nl WOB Heart- Regular rate and rhythm, 2/6 SEM LUSB (late peaking), 1/6 diastolic murmur LUSB GI- soft, NT, ND, + BS Extremities- no clubbing, cyanosis, trace edema  LABS: Basic Metabolic Panel:  Basename 06/02/11 0545 05/31/11 1000  NA 139 139  K 4.0 3.1*  CL 106 101  CO2 22 25  GLUCOSE 102* 98  BUN 9 9  CREATININE 0.64 0.57  CALCIUM 9.2 9.0  MG 1.8 --  PHOS -- --   ASSESSMENT AND PLAN:  Active Problems:  Acute systolic heart failure  Syncope  Aortic stenosis  Aortic insufficiency  1.  Syncope- unclear etiology, though history is concerning for cardiac syncope (VT). EF is 20-25%.  Further workup is necessary at this time.  She has slow NSVT (110 bpm) this am in  setting of hypokalemia.  We will replete K and follow on telemetry.  Will try to proceed with RHC/LHC to assess hemodynamics, AI/AS, and cors today but she is very orthopneic. I will give Lasix 40 mg iv and reevaluate. Otherwise, might have to diurese more before cath.   2.  Aortic stenosis/ AI- moderate AS with severe AI and decreased EF.  .  Will plan to proceed with RHC and LHC tomorrow followed by surgical consultation. She will likely need AVR and possible MV repair. A TEE would be helpful after cath.   3. Recent URI- wheezing on exam,  Will treat with bronchodilators and antibiotics  4. Acute systolic dysfunction- gentle diuresis  The patient was reevaluated in early afternoon and was still very orthopneic. Cath was cancelled. I will give her Lasix 40 mg IV BID. Reevaluate in am and see if cath is possible.   Lorine Bears, MD 06/02/2011 8:16 AM

## 2011-06-02 NOTE — Progress Notes (Signed)
Pt. wanted something stronger than tylenol 350mg  q4 h PRN for headache. Called provider on-call, Ronie Spies, PA, who stated she would put in order for ultram.

## 2011-06-03 ENCOUNTER — Inpatient Hospital Stay (HOSPITAL_COMMUNITY): Payer: Medicaid Other

## 2011-06-03 ENCOUNTER — Encounter (HOSPITAL_COMMUNITY)
Admission: EM | Disposition: A | Discharge status: Home / Self Care | Payer: Self-pay | Source: Home / Self Care | Attending: Internal Medicine

## 2011-06-03 DIAGNOSIS — M7989 Other specified soft tissue disorders: Secondary | ICD-10-CM

## 2011-06-03 DIAGNOSIS — J069 Acute upper respiratory infection, unspecified: Secondary | ICD-10-CM

## 2011-06-03 DIAGNOSIS — R062 Wheezing: Secondary | ICD-10-CM

## 2011-06-03 DIAGNOSIS — R0602 Shortness of breath: Secondary | ICD-10-CM

## 2011-06-03 LAB — BASIC METABOLIC PANEL
BUN: 10 mg/dL (ref 6–23)
CO2: 24 mEq/L (ref 19–32)
Calcium: 9.5 mg/dL (ref 8.4–10.5)
Chloride: 107 mEq/L (ref 96–112)
Creatinine, Ser: 0.7 mg/dL (ref 0.50–1.10)
GFR calc Af Amer: >90 mL/min (ref >90)
GFR calc non Af Amer: >90 mL/min (ref >90)
Glucose, Bld: 106 mg/dL — ABNORMAL HIGH (ref 70–99)
Potassium: 5.1 mEq/L (ref 3.5–5.1)
Sodium: 139 mEq/L (ref 135–145)

## 2011-06-03 SURGERY — Surgical Case

## 2011-06-03 MED ORDER — IPRATROPIUM BROMIDE 0.02 % IN SOLN
0.5000 mg | Freq: Four times a day (QID) | RESPIRATORY_TRACT | Status: DC
  Administered 2011-06-03 – 2011-06-04 (×2): 0.5 mg via RESPIRATORY_TRACT
  Filled 2011-06-03 (×2): qty 2.5

## 2011-06-03 MED ORDER — ALBUTEROL SULFATE (5 MG/ML) 0.5% IN NEBU
2.5000 mg | INHALATION_SOLUTION | Freq: Four times a day (QID) | RESPIRATORY_TRACT | Status: DC
  Administered 2011-06-03 – 2011-06-04 (×2): 2.5 mg via RESPIRATORY_TRACT
  Filled 2011-06-03 (×2): qty 0.5

## 2011-06-03 MED ORDER — FUROSEMIDE 10 MG/ML IJ SOLN
40.0000 mg | Freq: Three times a day (TID) | INTRAMUSCULAR | Status: DC
  Administered 2011-06-03 – 2011-06-05 (×5): 40 mg via INTRAVENOUS
  Filled 2011-06-03 (×8): qty 4

## 2011-06-03 NOTE — Progress Notes (Addendum)
SUBJECTIVE: The patient reports less SOB. Able to lie flat now. Still some LE edema. No further syncope.    She denies CP.     Marland Kitchen albuterol  2.5 mg Nebulization BID  . aspirin EC  81 mg Oral Daily  . carvedilol  25 mg Oral BID WC  . enoxaparin  40 mg Subcutaneous Q24H  . furosemide  40 mg Intravenous Once  . furosemide  40 mg Intravenous BID  . moxifloxacin  400 mg Oral q1800  . potassium chloride  40 mEq Oral TID AC & HS  . sodium chloride  3 mL Intravenous Q12H  . DISCONTD: albuterol  2.5 mg Nebulization Q6H      OBJECTIVE: Physical Exam: Filed Vitals:   06/03/11 0412 06/03/11 0413 06/03/11 0414 06/03/11 0417  BP: 105/73 122/77 120/86 126/85  Pulse: 78 75 77 85  Temp: 97.3 F (36.3 C)     TempSrc: Oral     Resp: 20     Height:      Weight: 77.4 kg (170 lb 10.2 oz)     SpO2: 95%       Intake/Output Summary (Last 24 hours) at 06/03/11 0714 Last data filed at 06/03/11 1478  Gross per 24 hour  Intake    651 ml  Output   2700 ml  Net  -2049 ml    Telemetry reveals sinus rhythm  GEN- The patient is well appearing, alert and oriented x 3 today.   Head- normocephalic, atraumatic Eyes-  Sclera clear, conjunctiva pink Ears- hearing intact Oropharynx- clear Neck- supple, no JVP Lymph- no cervical lymphadenopathy Lungs- diffuse expiratory wheezes, unlabored Heart- Regular rate and rhythm, 2/6 SEM LUSB (late peaking), 1/6 diastolic murmur LUSB GI- soft, NT, ND, + BS Extremities- no clubbing, cyanosis, 1+ edema  LABS: Basic Metabolic Panel:  Basename 06/02/11 0545 05/31/11 1000  NA 139 139  K 4.0 3.1*  CL 106 101  CO2 22 25  GLUCOSE 102* 98  BUN 9 9  CREATININE 0.64 0.57  CALCIUM 9.2 9.0  MG 1.8 --  PHOS -- --   ASSESSMENT AND PLAN:  Active Problems:  Acute systolic heart failure  Syncope  Aortic stenosis  Aortic insufficiency  1.  Syncope- unclear etiology, though history is concerning for cardiac syncope (VT). EF is 20-25%.  Further workup is necessary  at this time.   Will try to proceed with RHC/LHC to assess hemodynamics, AI/AS, and cors today. Patient feels she will be able to tolerate today. Will continue IV lasix and plan on cath later today.  2.  Aortic stenosis/ AI- moderate AS with severe AI and decreased EF.  .  Will plan to proceed with RHC and LHC today followed by surgical consultation. She will likely need AVR and possible MV repair. A TEE would be helpful after cath.   3. Recent URI- wheezing on exam,  Will treat with bronchodilators and antibiotics  4. Acute systolic dysfunction- IV diuresis   Samantha Nuttall [place], MD 06/03/2011 7:14 AM   Patient brought to the cath lab today for procedure. She is very anxious. Diffuse wheezing worse than earlier today. Clearly unable to safely lie supine for procedure. Will continue diuresis. Consult pulmonary. I anticipate that she will not be stable for cardiac cath until Monday at best.  Samantha Broner [place] MD, [facility]  06/03/2011 3:16 PM

## 2011-06-03 NOTE — Consult Note (Signed)
Patient: [name] DOB: [birth year] Date of Admission: 05/28/2011            Pulmonary consult  Date of Consult: 06/03/2011 MD requesting consult: [place] (cards)  Reason for consult: wheezing, SOB  HPI -  51 yo female with hx non ischemic cardiomyopathy, pulm HTN, CHF, and CAD polysubstance abuse who presented 2/8 with 1 month hx malaise, progressive DOE, cough, orthopnea, BLE edema and ultimately sought medical attention r/t syncopal episodes.  She was admitted by cardiology with syncope, mod/severe AS, ?URI and CHF.  She was treated with diuresis, abx and BD for ?URI and plans were made for R and L heart cath to eval hemodynamics in setting syncope as well as eval AS for probable surgical referral for AVR.  Prior to cath pt developed worsening SOB, wheeze and was unable to lay flat and PCCM consulted.   Allergies:  Allergies  Allergen Reactions  . Penicillins Hives     PMH: Past Medical History  Diagnosis Date  . CHF (congestive heart failure)   . Pulmonary nodule, left     f/u chest CT 2/12: interval clearing of RUL air space nodule, borderline enlarged mediastinal lymph nodes stable, pul. arterial HTN  . Emphysema   . Ovarian cyst     ovarian cystic mass  . Cocaine abuse   . Alcohol abuse   . Hypertension     pulmonary  . 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 Mitral stenosis   . Nonischemic cardiomyopathy     a. cath 6/08: pLAD 30%, mLAD 30%, oCFX 40%, dCVS 40%; OM 50%; dRCA 40%;    b. echo 06/01/10: severe LVH; EF 45-50%; inf HK; mild to mod AS (mean 30), mod. AI; mild to mod MS; mod MR; severe LAE, mod TR, mod PR, PASP 59;  c.  w/u for Amyloid done in past  . Tubal pregnancy     Hx  . Cannabis abuse   . Fibroids     abd. CT 2/12: large fibroid uterus  . Personal history of noncompliance with medical treatment, presenting hazards to health   . Pulmonary HTN     Home meds: Current Outpatient Prescriptions on File Prior to Encounter   Medication   Sig  Dispense  Refill   .  amLODipine (NORVASC) 10 MG tablet  TAKE ONE TABLET BY MOUTH EVERY DAY  30 tablet  6   .  carvedilol (COREG) 25 MG tablet  Take 1 tablet (25 mg total) by mouth 2 times daily with a meal.  60 tablet  12   .  enalapril (VASOTEC) 20 MG tablet  TAKE ONE TABLET BY MOUTH TWICE DAILY  60 tablet  12   .  furosemide (LASIX) 40 MG tablet  Take two tablets twice daily  120 tablet  12   .  potassium chloride (KLOR-CON) 20 MEQ  Take 20 mEq by mouth 2 (two) times daily       Social Hx: History   Social History  . Marital Status: Single    Spouse Name: N/A    Number of Children: N/A  . Years of Education: N/A   Occupational History  . Not on file.   Social History Main Topics  . Smoking status: Former Smoker -- 1.0 packs/day for 10 years    Types: Cigarettes    Quit date: 05/27/2010  . Smokeless tobacco: Former Neurosurgeon    Quit date: 05/27/2010  . Alcohol Use: 0.6 oz/week    1 Shots  of liquor per week  . Drug Use: Yes    Special: Marijuana     last smoked tonight  . Sexually Active: Yes   Other Topics Concern  . Not on file   Social History Narrative   Single no childrenTobacco Use - Hx of x-20 yearsHistory Cocaine and Mariguana use     Family Hx: Family History  Problem Relation Age of Onset  . Coronary artery disease    . Hypertension Mother      ROS: C/o mild SOB at rest, significant orthopnea, BLE swelling, occasional wheezing, cough productive of clear sputum.  Denies chest pain, hemoptysis, leg/calf pain, recent travel or recent sick contacts. All other systems reviewed and were neg.  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    Filed Vitals:   06/03/11 0417 06/03/11 0718 06/03/11 1335 06/03/11 1455  BP: 126/85  102/65 141/78  Pulse: 85 76 73 76  Temp:   98.5 F (36.9 C) 97.9 F (36.6 C)  TempSrc:   Oral Oral  Resp:   20 20  Height:      Weight:      SpO2:  99% 100% 99%     CBC Lab  Results  Component Value Date   WBC 5.2 06/02/2011   HGB 12.2 06/02/2011   HCT 37.5 06/02/2011   MCV 99.2 06/02/2011   PLT 164 06/02/2011     BMET    Component Value Date/Time   NA 139 06/03/2011 0645   K 5.1 06/03/2011 0645   CL 107 06/03/2011 0645   CO2 24 06/03/2011 0645   GLUCOSE 106* 06/03/2011 0645   BUN 10 06/03/2011 0645   CREATININE 0.70 06/03/2011 0645   CALCIUM 9.5 06/03/2011 0645   GFRNONAA >90 06/03/2011 0645   GFRAA >90 06/03/2011 0645   No recent ABG   EXAM: General: pleasant female, NAD  Neuro: awake and alert, MAE CV:  s1s2 rrr, no m/r/g PULM: resps even labored on [place], minimal R>L crackles, faint scattered exp wheeze GI: abd soft, non tender, +bs Extremities:  Warm and dry, 1+ R ankle edema, trace L ankle edema  CXRs:  2/10:  Cardiac enlargement and asymmetric interstitial and airspace process most likely CHF with asymmetric edema.  2/12 : No significant edema  IMPRESSION/ PLAN:  Dyspnea - multifactorial in setting systolic CHF, severe AS, pulm HTN +/- recent URI.  No known hx COPD but does have significant smoking hx. Scattered wheezes noted on exam PLAN -  Aggressive diuresis as BP, SCr tol per cards  CXR PA/Lat in am  Will add BD Pulmonary hygiene R/L heart cath when SOB improved and pt able to lie flat O2 as needed  No indication for steroids at this time  [facility], NP 06/03/2011  3:33 PM Pager: [phone]  *Care during the described time interval was provided by me and/or other providers on the critical care team. I have reviewed this patient's available data, including medical history, events of note, physical examination and test results as part of my evaluation.     Pulmonary Attending MD Addendum:  Pt seen and examined and database reviewed. I agree with above findings, assessment and plan. Her symptomatology is most suggestive of a cardiac etiology of her dyspnea (esp orthopnea) but she has little evidence of overt edema by exam  or CXR. She does have a few scattered wheezes on exam. We have initiated nebulized bronchodilators. Hopefully this will make  a difference.  The asymmetric ankle edema raises some concern for the possibilty of DVT but more likely it represents asymmetric resolution of edema as a result of diuresis. If it persists, we should consider LE dopplers  Billy Fischer, MD;  PCCM service; Mobile [phone]

## 2011-06-04 ENCOUNTER — Encounter (HOSPITAL_COMMUNITY): Payer: Self-pay

## 2011-06-04 LAB — CBC
MCH: 33.2 pg (ref 26.0–34.0)
MCHC: 33.7 g/dL (ref 30.0–36.0)
MCV: 98.7 fL (ref 78.0–100.0)
Platelets: 214 10*3/uL (ref 150–400)

## 2011-06-04 LAB — BASIC METABOLIC PANEL
BUN: 13 mg/dL (ref 6–23)
Calcium: 9.9 mg/dL (ref 8.4–10.5)
Creatinine, Ser: 0.79 mg/dL (ref 0.50–1.10)
GFR calc non Af Amer: >90 mL/min (ref >90)
Glucose, Bld: 96 mg/dL (ref 70–99)

## 2011-06-04 MED ORDER — ALBUTEROL SULFATE (5 MG/ML) 0.5% IN NEBU
2.5000 mg | INHALATION_SOLUTION | Freq: Three times a day (TID) | RESPIRATORY_TRACT | Status: DC
  Administered 2011-06-04 – 2011-06-09 (×14): 2.5 mg via RESPIRATORY_TRACT
  Filled 2011-06-04 (×14): qty 0.5

## 2011-06-04 MED ORDER — IPRATROPIUM BROMIDE 0.02 % IN SOLN: 0.5000 mg | Freq: Two times a day (BID) | RESPIRATORY_TRACT | Status: DC

## 2011-06-04 MED ORDER — IPRATROPIUM BROMIDE 0.02 % IN SOLN: 0.5000 mg | RESPIRATORY_TRACT | Status: DC | PRN

## 2011-06-04 MED ORDER — IPRATROPIUM BROMIDE 0.02 % IN SOLN
0.5000 mg | Freq: Three times a day (TID) | RESPIRATORY_TRACT | Status: DC
  Administered 2011-06-04 – 2011-06-07 (×7): 0.5 mg via RESPIRATORY_TRACT
  Filled 2011-06-04 (×4): qty 2.5

## 2011-06-04 MED ORDER — ALBUTEROL SULFATE (5 MG/ML) 0.5% IN NEBU
2.5000 mg | INHALATION_SOLUTION | RESPIRATORY_TRACT | Status: DC | PRN
  Administered 2011-06-07: 2.5 mg via RESPIRATORY_TRACT

## 2011-06-04 MED ORDER — ALBUTEROL SULFATE (5 MG/ML) 0.5% IN NEBU: 2.5000 mg | INHALATION_SOLUTION | Freq: Two times a day (BID) | RESPIRATORY_TRACT | Status: DC

## 2011-06-04 NOTE — Progress Notes (Signed)
SUBJECTIVE: The patient reports less SOB. Feels the best that she has this admission. Able to sleep in bed for the first time last night. Edema better.      Marland Kitchen albuterol  2.5 mg Nebulization Q6H  . aspirin EC  81 mg Oral Daily  . carvedilol  25 mg Oral BID WC  . enoxaparin  40 mg Subcutaneous Q24H  . furosemide  40 mg Intravenous Q8H  . ipratropium  0.5 mg Nebulization Q6H  . moxifloxacin  400 mg Oral q1800  . DISCONTD: albuterol  2.5 mg Nebulization BID  . DISCONTD: furosemide  40 mg Intravenous BID  . DISCONTD: potassium chloride  40 mEq Oral TID AC & HS  . DISCONTD: sodium chloride  3 mL Intravenous Q12H      OBJECTIVE: Physical Exam: Filed Vitals:   06/04/11 0545 06/04/11 0548 06/04/11 0550 06/04/11 0553  BP: 106/73 113/69 115/75 107/71  Pulse: 72 77 74 73  Temp: 98.1 F (36.7 C)     TempSrc: Oral     Resp: 18     Height:      Weight:    76.023 kg (167 lb 9.6 oz)  SpO2: 96%  100%     Intake/Output Summary (Last 24 hours) at 06/04/11 0816 Last data filed at 06/04/11 0036  Gross per 24 hour  Intake    390 ml  Output   2200 ml  Net  -1810 ml    Telemetry reveals sinus rhythm  GEN- The patient is well appearing, alert and oriented x 3 today.   Head- normocephalic, atraumatic Eyes-  Sclera clear, conjunctiva pink Ears- hearing intact Oropharynx- clear Neck- supple, no JVP Lymph- no cervical lymphadenopathy Lungs- few scattered wheezes, unlabored Heart- Regular rate and rhythm, 2/6 SEM LUSB (late peaking), 1/6 diastolic murmur LUSB GI- soft, NT, ND, + BS Extremities- no clubbing, cyanosis, 1+ edema  LABS: Basic Metabolic Panel:  Basename 06/04/11 0600 06/03/11 0645 06/02/11 0545  NA 138 139 --  K 4.6 5.1 --  CL 101 107 --  CO2 23 24 --  GLUCOSE 96 106* --  BUN 13 10 --  CREATININE 0.79 0.70 --  CALCIUM 9.9 9.5 --  MG -- -- 1.8  PHOS -- -- --   ASSESSMENT AND PLAN:  Active Problems:  Acute systolic heart failure  Syncope  Aortic stenosis  Aortic  insufficiency  1.  Syncope- unclear etiology, though history is concerning for cardiac syncope (VT). EF is 20-25%.  Further workup is necessary at this time.   Will try to proceed with RHC/LHC to assess hemodynamics, AI/AS, and cors. Procedure cancelled last two days because of marked dyspnea/ wheezing and inablility to lie flat.  2.  Aortic stenosis/ AI- moderate AS with severe AI and decreased EF.  .  Will plan to proceed with RHC and LHC  followed by surgical consultation. She will likely need AVR and possible MV repair. A TEE would be helpful after cath.   3. Recent URI- wheezing on exam,  Will treat with bronchodilators and antibiotics. Appreciate pulmonary input.  4. Acute systolic dysfunction- IV diuresis. Patient has diuresed 10 lbs since admission. Improving but still needs further optimization. Will continue diuresis over the weekend and plan on cardiac cath Monday.   Tanaysia Bhardwaj [place], MD 06/04/2011 8:16 AM

## 2011-06-04 NOTE — Progress Notes (Signed)
Ward Givens, NP  notified of 6 beat run of vtach.  No new orders given. Pt asymtomatic observed talking on the phone.

## 2011-06-04 NOTE — Progress Notes (Signed)
[name] is a 51 y.o. female former smoker admitted on 05/28/2011 with progressive dyspnea, cough, orthopnea, leg edema, and syncope.  Found to have mod/severe AS, CHF.  Tx with ABx and BD's for URI.  Developed wheezing prior to cardiac cath and PCCM consulted 2/14.  UDS positive for THC and cocaine.  Antibiotics: Avelox 2/11 (Bronchitis)>>  Tests/events: 2/10 Echo>>EF 20 to 25%, grade 3 diastolic dysfx, mod AS, severe AR, mod/severe MR, mod LA dilation, PAS 49 mmHg  SUBJECTIVE: Feels breathing better.  Decreased wheeze.  Not much cough or sputum.  OBJECTIVE:  Blood pressure 107/71, pulse 73, temperature 98.1 F (36.7 C), temperature source Oral, resp. rate 18, height 5\' 5"  (1.651 m), weight 167 lb 9.6 oz (76.023 kg), SpO2 98.00%. Wt Readings from Last 3 Encounters:  06/04/11 167 lb 9.6 oz (76.023 kg)  06/04/11 167 lb 9.6 oz (76.023 kg)  11/16/10 160 lb (72.576 kg)   Body mass index is 27.89 kg/(m^2).  I/O last 3 completed shifts: In: 595 [P.O.:485; Other:110] Out: 3500 [Urine:3500]  General - no distress HEENT - no sinus tenderness Cardiac - s1s2 regular, 3/6 SM  Chest - faint basilar wheeze Abd - soft, nontender Ext - minimal edema Neuro - normal strength  Lab Results  Component Value Date   WBC 4.7 06/04/2011   HGB 12.9 06/04/2011   HCT 38.3 06/04/2011   MCV 98.7 06/04/2011   PLT 214 06/04/2011   BMET    Component Value Date/Time   NA 138 06/04/2011 0600   K 4.6 06/04/2011 0600   CL 101 06/04/2011 0600   CO2 23 06/04/2011 0600   GLUCOSE 96 06/04/2011 0600   BUN 13 06/04/2011 0600   CREATININE 0.79 06/04/2011 0600   CALCIUM 9.9 06/04/2011 0600   GFRNONAA >90 06/04/2011 0600   GFRAA >90 06/04/2011 0600    Dg Chest 2 View  06/03/2011  *RADIOLOGY REPORT*  Clinical Data: Shortness of breath.  Wheezing.  Congestive heart failure.  CHEST - 2 VIEW  Comparison: 05/30/2011  Findings: The interstitial pulmonary edema seen on the prior study has almost resolved.  There is  still slight pulmonary vascular prominence with persistent cardiomegaly.  No effusions.  There is residual density in the right midzone and right perihilar region.  No osseous abnormality.  IMPRESSION: Almost complete clearing of the pulmonary edema.  Persistent pulmonary vascular prominence and cardiomegaly and slight residual edema on the right.  Original Report Authenticated By: Gwynn Burly, M.D.    ASSESSMENT/PLAN:  Progressive dyspnea with acute systolic CHF and valvular heart disease -per cardiology  Hx of smoking with wheezing and recent URI symptoms vs from THC/cocaine abuse -D5/7 avelox -change nebs to TID and q2h prn -will need PFT's as outpt when more stable  Can be available as needed over weekend.  Will check back on Monday.  Call if help needed sooner.  Raziel Koenigs Pager:  [phone] 06/04/2011, 1:32 PM

## 2011-06-05 LAB — BASIC METABOLIC PANEL
CO2: 26 mEq/L (ref 19–32)
Calcium: 9.6 mg/dL (ref 8.4–10.5)
Creatinine, Ser: 0.82 mg/dL (ref 0.50–1.10)
GFR calc non Af Amer: 82 mL/min — ABNORMAL LOW (ref >90)

## 2011-06-05 MED ORDER — POTASSIUM CHLORIDE CRYS ER 20 MEQ PO TBCR
20.0000 meq | EXTENDED_RELEASE_TABLET | Freq: Every day | ORAL | Status: DC
  Administered 2011-06-05 – 2011-06-11 (×4): 20 meq via ORAL
  Filled 2011-06-05 (×8): qty 1

## 2011-06-05 MED ORDER — FUROSEMIDE 40 MG PO TABS
40.0000 mg | ORAL_TABLET | Freq: Two times a day (BID) | ORAL | Status: DC
  Administered 2011-06-05 – 2011-06-11 (×11): 40 mg via ORAL
  Filled 2011-06-05 (×17): qty 1

## 2011-06-05 NOTE — Progress Notes (Signed)
    Subjective:  Feels much better. Able to lie flat now. No chest pain or dyspnea at rest.  Objective:  Vital Signs in the last 24 hours: Temp:  [97.4 F (36.3 C)-98.3 F (36.8 C)] 97.4 F (36.3 C) (02/16 0540) Pulse Rate:  [70-79] 74  (02/16 0540) Resp:  [19-20] 19  (02/16 0540) BP: (99-106)/(65-75) 99/65 mmHg (02/16 0540) SpO2:  [95 %-100 %] 96 % (02/16 0835) Weight:  [75.6 kg (166 lb 10.7 oz)] 75.6 kg (166 lb 10.7 oz) (02/16 0500)  Intake/Output from previous day: 02/15 0701 - 02/16 0700 In: 720 [P.O.:720] Out: 1825 [Urine:1825]  Physical Exam: Pt is alert and oriented, NAD HEENT: normal Neck: JVP - normal, carotids 2+= without bruits Lungs: coarse expiratory sounds otherwise clear CV: RRR with 3/6 systolic murmur at RUSB and soft diastolic decrescendo murmur Abd: soft, NT, Positive BS, no hepatomegaly Ext: minimal edema right foot, distal pulses intact and equal Skin: warm/dry no rash   Lab Results:  Basename 06/04/11 0600  WBC 4.7  HGB 12.9  PLT 214    Basename 06/05/11 0530 06/04/11 0600  NA 136 138  K 3.5 4.6  CL 99 101  CO2 26 23  GLUCOSE 90 96  BUN 14 13  CREATININE 0.82 0.79   No results found for this basename: TROPONINI:2,CK,MB:2 in the last 72 hours  Tele: sinus rhythm with short runs of NSVT  2D ECHO: Study Conclusions  - Left ventricle: The cavity size was moderately dilated. Systolic function was severely reduced. The estimated ejection fraction was in the range of 20% to 25%. Doppler parameters are consistent with a reversible restrictive pattern, indicative of decreased left ventricular diastolic compliance and/or increased left atrial pressure (grade 3 diastolic dysfunction). - Aortic valve: There was moderate stenosis. Moderate to severe regurgitation. 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 In apical five chamber and parasternal long axisview, leaflets  visualizedappear to open well. - Mitral valve: Moderate to severe regurgitation. - Left atrium: The atrium was moderately dilated. - Right ventricle: The cavity size was mildly dilated. Wall thickness was normal. - Right atrium: The atrium was mildly dilated. - Tricuspid valve: Moderate regurgitation. - Pulmonary arteries: Systolic pressure was moderately increased. PA peak pressure: 49mm Hg (S).    Assessment/Plan:  1. Acute on chronic systolic heart failure 2. 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 Syncope 4. URI - appreciate pulmonary management 5. Hypokalemia  Pt stable on current medical therapy. Markedly improved and now appears euvolemic. Will replete K, transition furosemide to oral, and put on cath board for right and left heart cath Monday.  Tonny Bollman, M.D. 06/05/2011, 9:58 AM

## 2011-06-06 LAB — BASIC METABOLIC PANEL
BUN: 16 mg/dL (ref 6–23)
Calcium: 9 mg/dL (ref 8.4–10.5)
GFR calc Af Amer: >90 mL/min (ref >90)
GFR calc non Af Amer: 85 mL/min — ABNORMAL LOW (ref >90)
Glucose, Bld: 96 mg/dL (ref 70–99)
Sodium: 137 mEq/L (ref 135–145)

## 2011-06-06 MED ORDER — SODIUM CHLORIDE 0.9 % IJ SOLN
3.0000 mL | Freq: Two times a day (BID) | INTRAMUSCULAR | Status: DC
  Administered 2011-06-06 (×2): 3 mL via INTRAVENOUS

## 2011-06-06 MED ORDER — SODIUM CHLORIDE 0.9 % IV SOLN
INTRAVENOUS | Status: DC
  Administered 2011-06-07: 50 mL/h via INTRAVENOUS

## 2011-06-06 MED ORDER — DIAZEPAM 5 MG PO TABS
5.0000 mg | ORAL_TABLET | ORAL | Status: AC
  Administered 2011-06-07: 5 mg via ORAL
  Filled 2011-06-06: qty 1

## 2011-06-06 MED ORDER — ASPIRIN 81 MG PO CHEW
324.0000 mg | CHEWABLE_TABLET | ORAL | Status: AC
  Administered 2011-06-07: 324 mg via ORAL
  Filled 2011-06-06: qty 4

## 2011-06-06 MED ORDER — SODIUM CHLORIDE 0.9 % IJ SOLN
3.0000 mL | INTRAMUSCULAR | Status: DC | PRN
  Administered 2011-06-07: 3 mL via INTRAVENOUS

## 2011-06-06 MED ORDER — SODIUM CHLORIDE 0.9 % IV SOLN: 250.0000 mL | INTRAVENOUS | Status: DC | PRN

## 2011-06-06 NOTE — Progress Notes (Signed)
    Subjective:  No chest pain or dyspnea. Feels good today. Ready for cath tomorrow.  Objective:  Vital Signs in the last 24 hours: Temp:  [97.7 F (36.5 C)-99 F (37.2 C)] 99 F (37.2 C) (02/17 0713) Pulse Rate:  [65-85] 85  (02/17 0719) Resp:  [19-20] 19  (02/17 0713) BP: (84-104)/(43-66) 95/63 mmHg (02/17 0719) SpO2:  [94 %-99 %] 95 % (02/17 0725) Weight:  [75 kg (165 lb 5.5 oz)] 75 kg (165 lb 5.5 oz) (02/17 0713)  Intake/Output from previous day: 02/16 0701 - 02/17 0700 In: 1040 [P.O.:1040] Out: 650 [Urine:650]  Physical Exam: Pt is alert and oriented, NAD HEENT: normal Neck: JVP - normal, carotids 2+= without bruits Lungs: CTA bilaterally CV: RRR with 3/6 systolic murmur with diastolic murmur heard throughout Abd: soft, NT, Positive BS, no hepatomegaly Ext: no C/C/E, distal pulses intact and equal Skin: warm/dry no rash   Lab Results:  Basename 06/04/11 0600  WBC 4.7  HGB 12.9  PLT 214    Basename 06/06/11 0519 06/05/11 0530  NA 137 136  K 3.6 3.5  CL 101 99  CO2 24 26  GLUCOSE 96 90  BUN 16 14  CREATININE 0.80 0.82   No results found for this basename: TROPONINI:2,CK,MB:2 in the last 72 hours  Tele: sinus rhythm, no significant arrhythmia last 24 hours.  Assessment/Plan:  1. Acute on chronic systolic heart failure  2. 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 Syncope  4. URI - appreciate pulmonary management  5. Hypokalemia  Pt stable. Labs reviewed and within normal limits. Will continue current med Rx with PO lasix and coreg. Plan right and left heart cath tomorrow. Risks/indication/alternatives reviewed with patient. She understands and agrees to proceed.  Tonny Bollman, M.D. 06/06/2011, 9:00 AM

## 2011-06-07 ENCOUNTER — Other Ambulatory Visit: Payer: Self-pay

## 2011-06-07 ENCOUNTER — Encounter (HOSPITAL_COMMUNITY)
Admission: EM | Disposition: A | Discharge status: Home / Self Care | Payer: Self-pay | Source: Home / Self Care | Attending: Internal Medicine

## 2011-06-07 ENCOUNTER — Inpatient Hospital Stay (HOSPITAL_COMMUNITY): Payer: Medicaid Other

## 2011-06-07 DIAGNOSIS — R0602 Shortness of breath: Secondary | ICD-10-CM

## 2011-06-07 DIAGNOSIS — R062 Wheezing: Secondary | ICD-10-CM

## 2011-06-07 DIAGNOSIS — I2510 Atherosclerotic heart disease of native coronary artery without angina pectoris: Secondary | ICD-10-CM

## 2011-06-07 DIAGNOSIS — J069 Acute upper respiratory infection, unspecified: Secondary | ICD-10-CM

## 2011-06-07 DIAGNOSIS — I359 Nonrheumatic aortic valve disorder, unspecified: Secondary | ICD-10-CM

## 2011-06-07 HISTORY — PX: LEFT AND RIGHT HEART CATHETERIZATION WITH CORONARY ANGIOGRAM: SHX5449

## 2011-06-07 LAB — CBC
HCT: 36.9 % (ref 36.0–46.0)
Hemoglobin: 12.5 g/dL (ref 12.0–15.0)
MCH: 32.8 pg (ref 26.0–34.0)
MCHC: 33.9 g/dL (ref 30.0–36.0)
MCV: 96.9 fL (ref 78.0–100.0)

## 2011-06-07 LAB — BASIC METABOLIC PANEL
BUN: 15 mg/dL (ref 6–23)
Creatinine, Ser: 0.83 mg/dL (ref 0.50–1.10)
GFR calc Af Amer: >90 mL/min (ref >90)
GFR calc non Af Amer: 81 mL/min — ABNORMAL LOW (ref >90)

## 2011-06-07 LAB — POCT I-STAT 3, VENOUS BLOOD GAS (G3P V)
Bicarbonate: 23.3 mEq/L (ref 20.0–24.0)
pH, Ven: 7.317 — ABNORMAL HIGH (ref 7.250–7.300)

## 2011-06-07 LAB — POCT I-STAT 3, ART BLOOD GAS (G3+)
TCO2: 25 mmol/L (ref 0–100)
pH, Arterial: 7.36 (ref 7.350–7.400)

## 2011-06-07 LAB — CREATININE, SERUM: GFR calc non Af Amer: >90 mL/min (ref >90)

## 2011-06-07 LAB — PULMONARY FUNCTION TEST

## 2011-06-07 SURGERY — LEFT AND RIGHT HEART CATHETERIZATION WITH CORONARY ANGIOGRAM
Anesthesia: Moderate Sedation

## 2011-06-07 MED ORDER — PREDNISONE 20 MG PO TABS
30.0000 mg | ORAL_TABLET | Freq: Every day | ORAL | Status: DC
  Administered 2011-06-08: 30 mg via ORAL
  Filled 2011-06-07 (×3): qty 1

## 2011-06-07 MED ORDER — ENOXAPARIN SODIUM 40 MG/0.4ML ~~LOC~~ SOLN
40.0000 mg | SUBCUTANEOUS | Status: DC
  Administered 2011-06-07 – 2011-06-10 (×3): 40 mg via SUBCUTANEOUS
  Filled 2011-06-07 (×6): qty 0.4

## 2011-06-07 MED ORDER — ACETAMINOPHEN 325 MG PO TABS
650.0000 mg | ORAL_TABLET | ORAL | Status: DC | PRN
  Administered 2011-06-07 – 2011-06-10 (×3): 650 mg via ORAL
  Filled 2011-06-07 (×3): qty 2

## 2011-06-07 MED ORDER — ONDANSETRON HCL 4 MG/2ML IJ SOLN: 4.0000 mg | Freq: Four times a day (QID) | INTRAMUSCULAR | Status: DC | PRN

## 2011-06-07 MED ORDER — HEPARIN (PORCINE) IN NACL 2-0.9 UNIT/ML-% IJ SOLN
INTRAMUSCULAR | Status: AC
  Filled 2011-06-07: qty 2000

## 2011-06-07 MED ORDER — PREDNISONE 20 MG PO TABS: 30.0000 mg | ORAL_TABLET | Freq: Every day | ORAL | Status: DC

## 2011-06-07 MED ORDER — LIDOCAINE HCL (PF) 1 % IJ SOLN
INTRAMUSCULAR | Status: AC
  Filled 2011-06-07: qty 30

## 2011-06-07 MED ORDER — TIOTROPIUM BROMIDE MONOHYDRATE 18 MCG IN CAPS
18.0000 ug | ORAL_CAPSULE | Freq: Every day | RESPIRATORY_TRACT | Status: DC
  Administered 2011-06-08 – 2011-06-11 (×4): 18 ug via RESPIRATORY_TRACT
  Filled 2011-06-07: qty 5

## 2011-06-07 MED ORDER — FENTANYL CITRATE 0.05 MG/ML IJ SOLN
INTRAMUSCULAR | Status: AC
  Filled 2011-06-07: qty 2

## 2011-06-07 MED ORDER — NITROGLYCERIN 0.2 MG/ML ON CALL CATH LAB
INTRAVENOUS | Status: AC
  Filled 2011-06-07: qty 1

## 2011-06-07 MED ORDER — MIDAZOLAM HCL 2 MG/2ML IJ SOLN
INTRAMUSCULAR | Status: AC
  Filled 2011-06-07: qty 2

## 2011-06-07 MED ORDER — BUDESONIDE-FORMOTEROL FUMARATE 160-4.5 MCG/ACT IN AERO
2.0000 | INHALATION_SPRAY | Freq: Two times a day (BID) | RESPIRATORY_TRACT | Status: DC
  Administered 2011-06-07 – 2011-06-11 (×8): 2 via RESPIRATORY_TRACT
  Filled 2011-06-07: qty 6

## 2011-06-07 MED ORDER — SODIUM CHLORIDE 0.9 % IV SOLN: 1.0000 mL/kg/h | INTRAVENOUS | Status: AC

## 2011-06-07 NOTE — Progress Notes (Signed)
[name] is a 51 y.o. female former smoker admitted on 05/28/2011 with progressive dyspnea, cough, orthopnea, leg edema, and syncope.  Found to have mod/severe AS, CHF.  Tx with ABx and BD's for URI.  Developed wheezing prior to cardiac cath and PCCM consulted 2/14.  UDS positive for THC and cocaine.  Antibiotics: Avelox 2/11 (Bronchitis)>>2/18  Tests/events: 2/10 Echo>>EF 20 to 25%, grade 3 diastolic dysfx, mod AS, severe AR, mod/severe MR, mod LA dilation, PAS 49 mmHg  SUBJECTIVE: Did okay over weekend.  Denies sinus congestion.  Still has some wheeze, but better.  Not bringing up as much sputum.  OBJECTIVE:  Blood pressure 114/78, pulse 83, temperature 98.5 F (36.9 C), temperature source Oral, resp. rate 18, height 5\' 5"  (1.651 m), weight 167 lb 11.2 oz (76.068 kg), SpO2 97.00%. Wt Readings from Last 3 Encounters:  06/07/11 167 lb 11.2 oz (76.068 kg)  06/07/11 167 lb 11.2 oz (76.068 kg)  06/07/11 167 lb 11.2 oz (76.068 kg)   Body mass index is 27.91 kg/(m^2).  I/O last 3 completed shifts: In: 1280 [P.O.:1200; I.V.:80] Out: 2050 [Urine:2050]  General - no distress HEENT - no sinus tenderness Cardiac - s1s2 regular, 3/6 SM  Chest - faint basilar wheeze, scattered rhonchi Abd - soft, nontender Ext - minimal edema Neuro - normal strength  Lab Results  Component Value Date   WBC 4.7 06/04/2011   HGB 12.9 06/04/2011   HCT 38.3 06/04/2011   MCV 98.7 06/04/2011   PLT 214 06/04/2011   BMET    Component Value Date/Time   NA 139 06/07/2011 0447   K 3.8 06/07/2011 0447   CL 103 06/07/2011 0447   CO2 25 06/07/2011 0447   GLUCOSE 89 06/07/2011 0447   BUN 15 06/07/2011 0447   CREATININE 0.83 06/07/2011 0447   CALCIUM 9.2 06/07/2011 0447   GFRNONAA 81* 06/07/2011 0447   GFRAA >90 06/07/2011 0447    No results found.  ASSESSMENT/PLAN:  Progressive dyspnea with acute systolic CHF and valvular heart disease -for Rt and Lt heart cath 2/18  Hx of smoking with wheezing and recent  URI symptoms vs from THC/cocaine abuse -D7/7 avelox>>d/c after dose today -will give short course of prednisone -will change to scheduled spiriva and symbicort with prn albuterol -will need PFT's as outpt when more stable   Ardie Dragoo Pager:  [phone] 06/07/2011, 8:24 AM

## 2011-06-07 NOTE — H&P (View-Only) (Signed)
    Subjective:  No chest pain or dyspnea. Feels good today. Ready for cath today.  Objective:  Vital Signs in the last 24 hours: Temp:  [97.3 F (36.3 C)-98.5 F (36.9 C)] 98.5 F (36.9 C) (02/18 0604) Pulse Rate:  [68-83] 83  (02/18 0610) Resp:  [18-20] 18  (02/18 0604) BP: (94-121)/(60-81) 114/78 mmHg (02/18 0610) SpO2:  [95 %-97 %] 97 % (02/18 0604) Weight:  [76.068 kg (167 lb 11.2 oz)] 76.068 kg (167 lb 11.2 oz) (02/18 0610)  Intake/Output from previous day: 02/17 0701 - 02/18 0700 In: 720 [P.O.:720] Out: 2050 [Urine:2050]  Physical Exam: Pt is alert and oriented, NAD HEENT: normal Neck: JVP - normal, carotids 2+= without bruits Lungs: CTA bilaterally CV: RRR with 3/6 systolic murmur with diastolic murmur heard throughout Abd: soft, NT, Positive BS, no hepatomegaly Ext: no C/C/E, distal pulses intact and equal Skin: warm/dry no rash   Lab Results: No results found for this basename: WBC:2,HGB:2,PLT:2 in the last 72 hours  Basename 06/07/11 0447 06/06/11 0519  NA 139 137  K 3.8 3.6  CL 103 101  CO2 25 24  GLUCOSE 89 96  BUN 15 16  CREATININE 0.83 0.80   No results found for this basename: TROPONINI:2,CK,MB:2 in the last 72 hours  Tele: sinus rhythm, no significant arrhythmia last 24 hours.  Assessment/Plan:  1. Acute on chronic systolic heart failure, EF 20-25%  2. 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 Syncope  4. URI - appreciate pulmonary management  5. Hypokalemia- repleted  Pt stable. Labs reviewed and within normal limits. Will continue current med Rx with PO lasix and coreg. Plan right and left heart cath today. Risks/indication/alternatives reviewed with patient. She understands and agrees to proceed. If surgical Rx indicated may need TEE pre-op.  Marciana Uplinger, M.D. 06/07/2011, 7:46 AM     

## 2011-06-07 NOTE — Progress Notes (Signed)
PFT has been performed.

## 2011-06-07 NOTE — Progress Notes (Signed)
1115 back from cardiac cath  Pt fully awake alert and oriented mpving all extremities . r groin  S/ cardiac cath level O kept R lower leg as instructed compliant

## 2011-06-07 NOTE — Progress Notes (Signed)
0981 transported to Cardiac cath [place] with transporter . No apparent distress . Pt spoken with Vanetta Shawl /phone prior transfer

## 2011-06-07 NOTE — Interval H&P Note (Signed)
History and Physical Interval Note:  06/07/2011 9:34 AM  [name]  has presented today for surgery, with the diagnosis of chf  The various methods of treatment have been discussed with the patient and family. After consideration of risks, benefits and other options for treatment, the patient has consented to  Procedure(s) (LRB): LEFT AND RIGHT HEART CATHETERIZATION WITH CORONARY ANGIOGRAM (N/A) as a surgical intervention .  The patients' history has been reviewed, patient examined, no change in status, stable for surgery.  I have reviewed the patients' chart and labs.  Questions were answered to the patient's satisfaction.     Theron Arista [facility]

## 2011-06-07 NOTE — Progress Notes (Signed)
    Subjective:  No chest pain or dyspnea. Feels good today. Ready for cath today.  Objective:  Vital Signs in the last 24 hours: Temp:  [97.3 F (36.3 C)-98.5 F (36.9 C)] 98.5 F (36.9 C) (02/18 0604) Pulse Rate:  [68-83] 83  (02/18 0610) Resp:  [18-20] 18  (02/18 0604) BP: (94-121)/(60-81) 114/78 mmHg (02/18 0610) SpO2:  [95 %-97 %] 97 % (02/18 0604) Weight:  [76.068 kg (167 lb 11.2 oz)] 76.068 kg (167 lb 11.2 oz) (02/18 0610)  Intake/Output from previous day: 02/17 0701 - 02/18 0700 In: 720 [P.O.:720] Out: 2050 [Urine:2050]  Physical Exam: Pt is alert and oriented, NAD HEENT: normal Neck: JVP - normal, carotids 2+= without bruits Lungs: CTA bilaterally CV: RRR with 3/6 systolic murmur with diastolic murmur heard throughout Abd: soft, NT, Positive BS, no hepatomegaly Ext: no C/C/E, distal pulses intact and equal Skin: warm/dry no rash   Lab Results: No results found for this basename: WBC:2,HGB:2,PLT:2 in the last 72 hours  Basename 06/07/11 0447 06/06/11 0519  NA 139 137  K 3.8 3.6  CL 103 101  CO2 25 24  GLUCOSE 89 96  BUN 15 16  CREATININE 0.83 0.80   No results found for this basename: TROPONINI:2,CK,MB:2 in the last 72 hours  Tele: sinus rhythm, no significant arrhythmia last 24 hours.  Assessment/Plan:  1. Acute on chronic systolic heart failure, EF 20-25%  2. 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 Syncope  4. URI - appreciate pulmonary management  5. Hypokalemia- repleted  Pt stable. Labs reviewed and within normal limits. Will continue current med Rx with PO lasix and coreg. Plan right and left heart cath today. Risks/indication/alternatives reviewed with patient. She understands and agrees to proceed. If surgical Rx indicated may need TEE pre-op.  Aixa Corsello [place], M.D. 06/07/2011, 7:46 AM

## 2011-06-07 NOTE — Op Note (Signed)
   Cardiac Catheterization Procedure Note  Name: [name] MRN: [id number] DOB: [birth year]  Procedure: Right Heart Cath, Left Heart Cath, Selective Coronary Angiography, LV angiography  Indication: 51 year old African American female with congestive heart failure, nonischemic cardiomyopathy, and severe aortic insufficiency.   Procedural Details: The right groin was prepped, draped, and anesthetized with 1% lidocaine. Using the modified Seldinger technique a 5 French sheath was placed in the right femoral artery and a 7 French sheath was placed in the right femoral vein. A Swan-Ganz catheter was used for the right heart catheterization. Standard protocol was followed for recording of right heart pressures and sampling of oxygen saturations. Fick cardiac output was calculated. Standard Judkins catheters were used for selective coronary angiography and left ventriculography. There were no immediate procedural complications. The patient was transferred to the post catheterization recovery area for further monitoring.  Procedural Findings: Hemodynamics RA 8/9 with a mean of 8 mm of mercury RV 41/11 mmHg PA 41/17 with a mean of 28 mm mercury PCWP 16/15 with a mean of 13 mm mercury LV 131/10 mmHg AO 98/62 with a mean of 75 mmHg  Oxygen saturations: PA 60% AO 93%  Cardiac Output (Fick) 4.18 L per minute    Thermodilution output: 3.96 L per minute  Cardiac Index (Fick) 2.3 teeters per minute per meter square   Thermodilution index: 2.18 L per minute per meter square   Aortic valve mean gradient is 18 mm mercury with an aortic valve area of 1.3 cm square and an index of 0.72. Mitral valve mean gradient is 4.6 mm mercury. Valve area is 2.55 cm square.  Coronary angiography: Coronary dominance: right  Left mainstem: Large and normal.  Left anterior descending (LAD): Is a large vessel extending to the apex. There is diffuse 30% disease in the proximal to mid LAD. The first diagonal is a  large vessel with 70% stenosis in the proximal to mid vessel.  Left circumflex (LCx): This is a large vessel. Gives rise to 3 marginal branches. The first marginal branch has a segmental 50% disease proximally. The second marginal branch a smaller and has somewhat diffuse disease distally. The left circumflex following the second marginal branch has a 50-60% segmental stenosis.  Right coronary artery (RCA): There is a large dominant vessel. It has scattered irregularities less than 20%.  Left ventriculography: Left ventricular size is increased with severe global hypokinesis. Ejection fraction is estimated at 20%. There is mild mitral insufficiency noted.  Aortic root angiography demonstrates mild aortic dilatation that is diffuse. There is severe aortic insufficiency graded at 3-4+.  Final Conclusions:   1. Moderate 2 vessel coronary disease. 2. Severe left ventricular dysfunction. 3. Moderate aortic stenosis with moderate to severe aortic insufficiency. 4. Mild pulmonary hypertension. 5. Normal left ventricular filling pressures indicate adequate diuresis.  Recommendations: We will plan on transesophageal echo in the morning to further evaluate her aortic valve. We'll consult surgery for consideration of aortic valve replacement.   Theron Arista [facility] 06/07/2011, 10:24 AM

## 2011-06-08 ENCOUNTER — Encounter (HOSPITAL_COMMUNITY): Payer: Self-pay | Admitting: Cardiology

## 2011-06-08 ENCOUNTER — Encounter (HOSPITAL_COMMUNITY)
Admission: EM | Disposition: A | Discharge status: Home / Self Care | Payer: Self-pay | Source: Home / Self Care | Attending: Internal Medicine

## 2011-06-08 DIAGNOSIS — I059 Rheumatic mitral valve disease, unspecified: Secondary | ICD-10-CM

## 2011-06-08 DIAGNOSIS — I359 Nonrheumatic aortic valve disorder, unspecified: Secondary | ICD-10-CM

## 2011-06-08 DIAGNOSIS — I5023 Acute on chronic systolic (congestive) heart failure: Secondary | ICD-10-CM

## 2011-06-08 DIAGNOSIS — R0602 Shortness of breath: Secondary | ICD-10-CM

## 2011-06-08 DIAGNOSIS — R062 Wheezing: Secondary | ICD-10-CM

## 2011-06-08 DIAGNOSIS — I079 Rheumatic tricuspid valve disease, unspecified: Secondary | ICD-10-CM

## 2011-06-08 DIAGNOSIS — J069 Acute upper respiratory infection, unspecified: Secondary | ICD-10-CM

## 2011-06-08 HISTORY — PX: TEE WITHOUT CARDIOVERSION: SHX5443

## 2011-06-08 LAB — BASIC METABOLIC PANEL
CO2: 23 mEq/L (ref 19–32)
Calcium: 9 mg/dL (ref 8.4–10.5)
GFR calc non Af Amer: >90 mL/min (ref >90)
Sodium: 136 mEq/L (ref 135–145)

## 2011-06-08 LAB — PRO B NATRIURETIC PEPTIDE: Pro B Natriuretic peptide (BNP): 2357 pg/mL — ABNORMAL HIGH (ref 0–125)

## 2011-06-08 SURGERY — ECHOCARDIOGRAM, TRANSESOPHAGEAL
Anesthesia: Moderate Sedation

## 2011-06-08 MED ORDER — SODIUM CHLORIDE 0.9 % IV SOLN: 1.0000 mL/kg/h | INTRAVENOUS | Status: DC

## 2011-06-08 MED ORDER — SODIUM CHLORIDE 0.9 % IJ SOLN
3.0000 mL | Freq: Two times a day (BID) | INTRAMUSCULAR | Status: DC
  Administered 2011-06-08 – 2011-06-09 (×2): 3 mL via INTRAVENOUS

## 2011-06-08 MED ORDER — FENTANYL CITRATE 0.05 MG/ML IJ SOLN
INTRAMUSCULAR | Status: AC
  Filled 2011-06-08: qty 2

## 2011-06-08 MED ORDER — ASPIRIN 81 MG PO CHEW: 324.0000 mg | CHEWABLE_TABLET | ORAL | Status: DC

## 2011-06-08 MED ORDER — MIDAZOLAM HCL 10 MG/2ML IJ SOLN
INTRAMUSCULAR | Status: DC | PRN
  Administered 2011-06-08 (×3): 2 mg via INTRAVENOUS

## 2011-06-08 MED ORDER — FENTANYL CITRATE 0.05 MG/ML IJ SOLN
INTRAMUSCULAR | Status: DC | PRN
  Administered 2011-06-08 (×2): 25 ug via INTRAVENOUS

## 2011-06-08 MED ORDER — SODIUM CHLORIDE 0.9 % IV SOLN: 250.0000 mL | INTRAVENOUS | Status: DC | PRN

## 2011-06-08 MED ORDER — BUTAMBEN-TETRACAINE-BENZOCAINE 2-2-14 % EX AERO
INHALATION_SPRAY | CUTANEOUS | Status: DC | PRN
  Administered 2011-06-08: 2 via TOPICAL

## 2011-06-08 MED ORDER — ROSUVASTATIN CALCIUM 10 MG PO TABS
10.0000 mg | ORAL_TABLET | Freq: Every day | ORAL | Status: DC
  Administered 2011-06-08 – 2011-06-11 (×4): 10 mg via ORAL
  Filled 2011-06-08 (×5): qty 1

## 2011-06-08 MED ORDER — SODIUM CHLORIDE 0.9 % IV SOLN
Freq: Once | INTRAVENOUS | Status: AC
  Administered 2011-06-08: 250 mL via INTRAVENOUS

## 2011-06-08 MED ORDER — MIDAZOLAM HCL 10 MG/2ML IJ SOLN
INTRAMUSCULAR | Status: AC
  Filled 2011-06-08: qty 2

## 2011-06-08 MED ORDER — SODIUM CHLORIDE 0.9 % IJ SOLN: 3.0000 mL | INTRAMUSCULAR | Status: DC | PRN

## 2011-06-08 MED ORDER — DIAZEPAM 5 MG PO TABS: 10.0000 mg | ORAL_TABLET | ORAL | Status: AC

## 2011-06-08 MED ORDER — PREDNISONE 20 MG PO TABS
20.0000 mg | ORAL_TABLET | Freq: Every day | ORAL | Status: DC
  Administered 2011-06-09: 20 mg via ORAL
  Filled 2011-06-08 (×2): qty 1

## 2011-06-08 NOTE — Progress Notes (Signed)
  Echocardiogram Echocardiogram Transesophageal has been performed.  Mercy Moore 06/08/2011, 10:58 AM

## 2011-06-08 NOTE — Progress Notes (Signed)
    Subjective:  No chest pain or dyspnea. Complains of cough  Objective:  Vital Signs in the last 24 hours: Temp:  [97.5 F (36.4 C)-98.3 F (36.8 C)] 98.3 F (36.8 C) (02/19 1024) Pulse Rate:  [63-81] 78  (02/19 1024) Resp:  [14-18] 14  (02/19 1024) BP: (62-123)/(33-84) 114/80 mmHg (02/19 1024) SpO2:  [95 %-100 %] 95 % (02/19 1024) Weight:  [170 lb 8 oz (77.338 kg)] 170 lb 8 oz (77.338 kg) (02/19 0420)  Intake/Output from previous day: 02/18 0701 - 02/19 0700 In: 240 [P.O.:240] Out: 675 [Urine:675]  Physical Exam: Pt is alert and oriented, NAD HEENT: normal Neck: Supple Lungs: CTA bilaterally CV: RRR with 3/6 systolic murmur with diastolic murmur heard throughout Abd: soft, NT, Positive BS, no hepatomegaly Ext: no C/C/E, distal pulses intact and equal Skin: warm/dry no rash   Lab Results:  Basename 06/07/11 1329  WBC 3.9*  HGB 12.5  PLT 248    Basename 06/08/11 0540 06/07/11 1329 06/07/11 0447  NA 136 -- 139  K 5.3* -- 3.8  CL 104 -- 103  CO2 23 -- 25  GLUCOSE 89 -- 89  BUN 15 -- 15  CREATININE 0.72 0.78 --   No results found for this basename: TROPONINI:2,CK,MB:2 in the last 72 hours   Assessment/Plan:  1. Acute on chronic systolic heart failure  2. 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 Syncope  4. URI - appreciate pulmonary management  5. Hypokalemia 6. CAD 7. Substance abuse 8. Fibroids 9. Hypertension  Plan TEE today to assess aortic valve, MR and TR; surgical consult for valve replacement; continue coreg, ASA and lasix. Add ACEI post op for LV dysfunction. Volume status improving; follow renal function. Continue pulmonary toilet for URI. Continue ASA for CAD. Add crestor.  Olga Millers, M.D. 06/08/2011, 11:15 AM

## 2011-06-08 NOTE — Progress Notes (Signed)
[name] is a 51 y.o. female former smoker admitted on 05/28/2011 with progressive dyspnea, cough, orthopnea, leg edema, and syncope.  Found to have mod/severe AS, CHF.  Tx with ABx and BD's for URI.  Developed wheezing prior to cardiac cath and PCCM consulted 2/14.  UDS positive for THC and cocaine.  Antibiotics: Avelox 2/11 (Bronchitis)>>2/18  Tests/events: 2/10 Echo>>EF 20 to 25%, grade 3 diastolic dysfx, mod AS, severe AR, mod/severe MR, mod LA dilation, PAS 49 mmHg 2/18 PFT>>FEV1 1.29(53%), FEV1% 66, TLC 4.72(90%), DLCO 59%, borderline BD response 2/18 Rt/Lt heart cath>>moderate 2 vessel CAD, severe LV dysfunction, moderate AS with severe AI, mild pulm HTN  SUBJECTIVE: Breathing better after getting symbicort and prednisone.  Had TEE this morning.  OBJECTIVE:  Blood pressure 104/68, pulse 78, temperature 98.8 F (37.1 C), temperature source Oral, resp. rate 18, height 5\' 5"  (1.651 m), weight 170 lb 8 oz (77.338 kg), SpO2 97.00%. Wt Readings from Last 3 Encounters:  06/08/11 170 lb 8 oz (77.338 kg)  06/08/11 170 lb 8 oz (77.338 kg)  06/08/11 170 lb 8 oz (77.338 kg)   Body mass index is 28.37 kg/(m^2).  I/O last 3 completed shifts: In: 320 [P.O.:240; I.V.:80] Out: 1425 [Urine:1425]  General - no distress HEENT - no sinus tenderness Cardiac - s1s2 regular, 3/6 SM  Chest - no wheeze Abd - soft, nontender Ext - no edema Neuro - normal strength  Lab Results  Component Value Date   WBC 3.9* 06/07/2011   HGB 12.5 06/07/2011   HCT 36.9 06/07/2011   MCV 96.9 06/07/2011   PLT 248 06/07/2011   BMET    Component Value Date/Time   NA 136 06/08/2011 0540   K 5.3* 06/08/2011 0540   CL 104 06/08/2011 0540   CO2 23 06/08/2011 0540   GLUCOSE 89 06/08/2011 0540   BUN 15 06/08/2011 0540   CREATININE 0.72 06/08/2011 0540   CALCIUM 9.0 06/08/2011 0540   GFRNONAA >90 06/08/2011 0540   GFRAA >90 06/08/2011 0540     ASSESSMENT/PLAN:  Progressive dyspnea with acute systolic CHF and  valvular heart disease -possibly needs cardiothoracic eval>>defer to cardiology  Hx of smoking with wheezing and recent URI symptoms vs from THC/cocaine abuse -PFT shows moderate obstruction with moderate diffusion defect, and borderline bronchodilator response -wean off prednisone -continue spiriva and symbicort with prn albuterol -will need repeat PFT's as outpt when more stable to assess true severity of lung disease   [name] Pager:  [phone] 06/08/2011, 5:52 PM

## 2011-06-08 NOTE — Interval H&P Note (Signed)
History and Physical Interval Note:  06/08/2011 11:13 AM  [name]  has presented today for surgery, with the diagnosis of eval aortic value  The various methods of treatment have been discussed with the patient and family. After consideration of risks, benefits and other options for treatment, the patient has consented to  Procedure(s) (LRB): TRANSESOPHAGEAL ECHOCARDIOGRAM (TEE) (N/A) as a surgical intervention .  The patients' history has been reviewed, patient examined, no change in status, stable for surgery.  I have reviewed the patients' chart and labs.  Questions were answered to the patient's satisfaction.     Olga Millers

## 2011-06-08 NOTE — Consult Note (Signed)
Reason for Consult:Consider AVR Referring Physician: Dr. [place]  [name] is an 51 y.o. female.  HPI: 51 yo female with history of cardiomyopathy and CHF. About 2 weeks PTA she began having increased SOB, orthopnea and peripheral edema. She finally came to ED after syncopal episode. She says the room started spinning then she woke up on floor having fallen forward and there was blood from having tongue laceration from fall. Admitted with CHF. Symptoms improved quickly with medical treatment and diuresis.  Past Medical History  Diagnosis Date  . CHF (congestive heart failure)   . Pulmonary nodule, left     f/u chest CT 2/12: interval clearing of RUL air space nodule, borderline enlarged mediastinal lymph nodes stable, pul. arterial HTN  . Emphysema   . Ovarian cyst     ovarian cystic mass  . Cocaine abuse   . Alcohol abuse   . Hypertension     pulmonary  . 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 Mitral stenosis   . Nonischemic cardiomyopathy     a. cath 6/08: pLAD 30%, mLAD 30%, oCFX 40%, dCVS 40%; OM 50%; dRCA 40%;    b. echo 06/01/10: severe LVH; EF 45-50%; inf HK; mild to mod AS (mean 30), mod. AI; mild to mod MS; mod MR; severe LAE, mod TR, mod PR, PASP 59;  c.  w/u for Amyloid done in past  . Tubal pregnancy     Hx  . Cannabis abuse   . Fibroids     abd. CT 2/12: large fibroid uterus  . Personal history of noncompliance with medical treatment, presenting hazards to health   . Pulmonary HTN     Past Surgical History  Procedure Date  . Tubal ligation   . Cardiac catheterization 2008    Family History  Problem Relation Age of Onset  . Coronary artery disease    . Hypertension Mother     Social History:  reports that she quit smoking about a year ago. Her smoking use included Cigarettes. She has a 10 pack-year smoking history. She quit smokeless tobacco use about a year ago. She reports that she drinks about .6 ounces of alcohol per week. She reports  that she uses illicit drugs (Marijuana). She says she was "around" cocaine New Year's Eve but didn't use any. Tox screen +  Allergies:  Allergies  Allergen Reactions  . Penicillins Hives    Medications:  Prior to Admission:  Prescriptions prior to admission  Medication Sig Dispense Refill  . amLODipine (NORVASC) 10 MG tablet TAKE ONE TABLET BY MOUTH EVERY DAY  30 tablet  6  . carvedilol (COREG) 25 MG tablet Take 1 tablet (25 mg total) by mouth 2 (two) times daily with a meal.  60 tablet  12  . cetirizine (ZYRTEC) 10 MG tablet Take 10 mg by mouth daily.      . enalapril (VASOTEC) 20 MG tablet TAKE ONE TABLET BY MOUTH TWICE DAILY  60 tablet  12  . furosemide (LASIX) 40 MG tablet Take two tablets twice daily  120 tablet  12  . potassium chloride (KLOR-CON) 20 MEQ packet Take 20 mEq by mouth 2 (two) times daily.  60 tablet  12    Results for orders placed during the hospital encounter of 05/28/11 (from the past 48 hour(s))  BASIC METABOLIC PANEL     Status: Abnormal   Collection Time   06/07/11  4:47 AM      Component Value Range Comment  Sodium 139  135 - 145 (mEq/L)    Potassium 3.8  3.5 - 5.1 (mEq/L)    Chloride 103  96 - 112 (mEq/L)    CO2 25  19 - 32 (mEq/L)    Glucose, Bld 89  70 - 99 (mg/dL)    BUN 15  6 - 23 (mg/dL)    Creatinine, Ser 1.61  0.50 - 1.10 (mg/dL)    Calcium 9.2  8.4 - 10.5 (mg/dL)    GFR calc non Af Amer 81 (*) >90 (mL/min)    GFR calc Af Amer >90  >90 (mL/min)   POCT I-STAT 3, BLOOD GAS (G3+)     Status: Abnormal   Collection Time   06/07/11  9:52 AM      Component Value Range Comment   pH, Arterial 7.360  7.350 - 7.400     pCO2 arterial 41.6  35.0 - 45.0 (mmHg)    pO2, Arterial 71.0 (*) 80.0 - 100.0 (mmHg)    Bicarbonate 23.5  20.0 - 24.0 (mEq/L)    TCO2 25  0 - 100 (mmol/L)    O2 Saturation 93.0      Acid-base deficit 2.0  0.0 - 2.0 (mmol/L)    Sample type ARTERIAL     POCT I-STAT 3, BLOOD GAS (G3P V)     Status: Abnormal   Collection Time    06/07/11  9:53 AM      Component Value Range Comment   pH, Ven 7.317 (*) 7.250 - 7.300     pCO2, Ven 45.6  45.0 - 50.0 (mmHg)    pO2, Ven 34.0  30.0 - 45.0 (mmHg)    Bicarbonate 23.3  20.0 - 24.0 (mEq/L)    TCO2 25  0 - 100 (mmol/L)    O2 Saturation 60.0      Acid-base deficit 3.0 (*) 0.0 - 2.0 (mmol/L)    Sample type VENOUS      Comment NOTIFIED PHYSICIAN     CBC     Status: Abnormal   Collection Time   06/07/11  1:29 PM      Component Value Range Comment   WBC 3.9 (*) 4.0 - 10.5 (K/uL)    RBC 3.81 (*) 3.87 - 5.11 (MIL/uL)    Hemoglobin 12.5  12.0 - 15.0 (g/dL)    HCT 09.6  04.5 - 40.9 (%)    MCV 96.9  78.0 - 100.0 (fL)    MCH 32.8  26.0 - 34.0 (pg)    MCHC 33.9  30.0 - 36.0 (g/dL)    RDW 81.1  91.4 - 78.2 (%)    Platelets 248  150 - 400 (K/uL)   CREATININE, SERUM     Status: Normal   Collection Time   06/07/11  1:29 PM      Component Value Range Comment   Creatinine, Ser 0.78  0.50 - 1.10 (mg/dL)    GFR calc non Af Amer >90  >90 (mL/min)    GFR calc Af Amer >90  >90 (mL/min)   BASIC METABOLIC PANEL     Status: Abnormal   Collection Time   06/08/11  5:40 AM      Component Value Range Comment   Sodium 136  135 - 145 (mEq/L)    Potassium 5.3 (*) 3.5 - 5.1 (mEq/L)    Chloride 104  96 - 112 (mEq/L)    CO2 23  19 - 32 (mEq/L)    Glucose, Bld 89  70 - 99 (mg/dL)    BUN 15  6 - 23 (mg/dL)    Creatinine, Ser 1.61  0.50 - 1.10 (mg/dL)    Calcium 9.0  8.4 - 10.5 (mg/dL)    GFR calc non Af Amer >90  >90 (mL/min)    GFR calc Af Amer >90  >90 (mL/min)   PRO B NATRIURETIC PEPTIDE     Status: Abnormal   Collection Time   06/08/11  5:40 AM      Component Value Range Comment   Pro B Natriuretic peptide (BNP) 2357.0 (*) 0 - 125 (pg/mL)    ECHO:-   Left ventricle: The cavity size was moderately dilated. Systolic function was severely reduced. The estimated ejection fraction was in the range of 20% to 25%. Doppler parameters are consistent with a reversible restrictive pattern,  indicative of decreased left ventricular diastolic compliance and/or increased left atrial pressure (grade 3 diastolic dysfunction). - Aortic valve: There was moderate stenosis. Moderate to severe regurgitation. 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 In apical five chamber and parasternal long axisview, leaflets visualizedappear to open well. - Mitral valve: Moderate to severe regurgitation. - Left atrium: The atrium was moderately dilated. - Right ventricle: The cavity size was mildly dilated. Wall thickness was normal. - Right atrium: The atrium was mildly dilated. - Tricuspid valve: Moderate regurgitation. - Pulmonary arteries: Systolic pressure was moderately increased. PA peak pressure: 49mm Hg (S). Impressions:  - When compared to 06/01/10, EF is reduced. Consider TEE or other imaging modality to demonstrate distal left ventricular outflow/ aortic root as well as valve leaflets.  CATH:  Hemodynamics  RA 8/9 with a mean of 8 mm of mercury  RV 41/11 mmHg  PA 41/17 with a mean of 28 mm mercury  PCWP 16/15 with a mean of 13 mm mercury  LV 131/10 mmHg  AO 98/62 with a mean of 75 mmHg  Oxygen saturations:  PA 60%  AO 93%  Cardiac Output (Fick) 4.18 L per minute Thermodilution output: 3.96 L per minute  Cardiac Index (Fick) 2.3 teeters per minute per meter square Thermodilution index: 2.18 L per minute per meter square  Aortic valve mean gradient is 18 mm mercury with an aortic valve area of 1.3 cm square and an index of 0.72.  Mitral valve mean gradient is 4.6 mm mercury. Valve area is 2.55 cm square.  Final Conclusions:  1. Moderate 2 vessel coronary disease.  2. Severe left ventricular dysfunction.  3. Moderate aortic stenosis with moderate to severe aortic insufficiency.  4. Mild pulmonary hypertension.  5. Normal left ventricular filling pressures indicate adequate diuresis.  No results  found.  Review of Systems  Constitutional: Positive for malaise/fatigue. Negative for fever, chills and weight loss.  Respiratory: Positive for cough, shortness of breath and wheezing. Negative for hemoptysis and sputum production.   Cardiovascular: Positive for leg swelling. Negative for chest pain, palpitations and claudication.  Gastrointestinal: Negative.   Genitourinary: Negative.   Musculoskeletal: Positive for falls. Negative for myalgias.  Skin: Negative.   Neurological: Positive for dizziness, loss of consciousness and weakness.  Endo/Heme/Allergies: Negative.   Psychiatric/Behavioral:       Tox screen + cocaine, says she was last "around" it 12/31, but "did not use" History of marijuana   Blood pressure 104/68, pulse 78, temperature 98.8 F (37.1 C), temperature source Oral, resp. rate 18, height 5\' 5"  (1.651 m), weight 77.338 kg (170 lb 8 oz), SpO2 97.00%. Physical Exam  Vitals reviewed. Constitutional: She is oriented  to person, place, and time. She appears well-developed and well-nourished. No distress.  HENT:  Head: Normocephalic and atraumatic.  Eyes: EOM are normal. Pupils are equal, round, and reactive to light.  Neck: Normal range of motion. No JVD present. No thyromegaly present.  Cardiovascular: Normal rate, regular rhythm and intact distal pulses.  Exam reveals no friction rub.   Murmur (2/6 systolic, + diastolic) heard. Respiratory: Effort normal and breath sounds normal. She has no wheezes. She has no rales.  GI: Soft. There is no tenderness.  Musculoskeletal: She exhibits no edema.  Lymphadenopathy:    She has no cervical adenopathy.  Neurological: She is alert and oriented to person, place, and time.  Skin: Skin is warm and dry.  Psychiatric: She has a normal mood and affect.    Assessment/Plan: 51 yo woman with severe cardiomyopathy and multiple valvular lesions, including AS, AI, MR, MS and TR. TEE has been done but I've been unable to access report  yet. Will d/w with Dr. Jens Som.  Mardell Cragg C 06/08/2011, 5:50 PM

## 2011-06-08 NOTE — Procedures (Signed)
See report in [facility]  

## 2011-06-09 ENCOUNTER — Encounter (HOSPITAL_COMMUNITY): Payer: Self-pay | Admitting: Cardiology

## 2011-06-09 DIAGNOSIS — Z01810 Encounter for preprocedural cardiovascular examination: Secondary | ICD-10-CM

## 2011-06-09 LAB — BASIC METABOLIC PANEL WITH GFR
BUN: 15 mg/dL (ref 6–23)
CO2: 18 meq/L — ABNORMAL LOW (ref 19–32)
Calcium: 9.3 mg/dL (ref 8.4–10.5)
Chloride: 106 meq/L (ref 96–112)
Creatinine, Ser: 0.83 mg/dL (ref 0.50–1.10)
GFR calc Af Amer: >90 mL/min
GFR calc non Af Amer: 81 mL/min — ABNORMAL LOW
Glucose, Bld: 85 mg/dL (ref 70–99)
Potassium: 3.9 meq/L (ref 3.5–5.1)
Sodium: 137 meq/L (ref 135–145)

## 2011-06-09 MED ORDER — LISINOPRIL 2.5 MG PO TABS
2.5000 mg | ORAL_TABLET | Freq: Every day | ORAL | Status: DC
  Administered 2011-06-09 – 2011-06-11 (×3): 2.5 mg via ORAL
  Filled 2011-06-09 (×4): qty 1

## 2011-06-09 MED ORDER — ALBUTEROL SULFATE HFA 108 (90 BASE) MCG/ACT IN AERS
2.0000 | INHALATION_SPRAY | Freq: Three times a day (TID) | RESPIRATORY_TRACT | Status: DC
  Administered 2011-06-09 – 2011-06-11 (×7): 2 via RESPIRATORY_TRACT
  Filled 2011-06-09: qty 6.7

## 2011-06-09 MED ORDER — PREDNISONE 10 MG PO TABS
10.0000 mg | ORAL_TABLET | Freq: Every day | ORAL | Status: DC
  Administered 2011-06-10 – 2011-06-11 (×2): 10 mg via ORAL
  Filled 2011-06-09 (×4): qty 1

## 2011-06-09 NOTE — Progress Notes (Signed)
[name] is a 51 y.o. female former smoker admitted on 05/28/2011 with progressive dyspnea, cough, orthopnea, leg edema, and syncope.  Found to have mod/severe AS, CHF.  Tx with ABx and BD's for URI.  Developed wheezing prior to cardiac cath and PCCM consulted 2/14.  UDS positive for THC and cocaine.  Antibiotics: Avelox 2/11 (Bronchitis)>>2/18  Tests/events: 2/10 Echo>>EF 20 to 25%, grade 3 diastolic dysfx, mod AS, severe AR, mod/severe MR, mod LA dilation, PAS 49 mmHg 2/18 PFT>>FEV1 1.29(53%), FEV1% 66, TLC 4.72(90%), DLCO 59%, borderline BD response 2/18 Rt/Lt heart cath>>moderate 2 vessel CAD, severe LV dysfunction, moderate AS with severe AI, mild pulm HTN 2/19 TEE>>EF 25 to 30%, mod AS, mod/severe AI, mod TR  SUBJECTIVE: Breathing much better.  Feels spiriva and symbicort combination working well.  OBJECTIVE:  Blood pressure 105/76, pulse 76, temperature 98.7 F (37.1 C), temperature source Oral, resp. rate 18, height 5\' 5"  (1.651 m), weight 171 lb 1.2 oz (77.6 kg), SpO2 97.00%. Wt Readings from Last 3 Encounters:  06/09/11 171 lb 1.2 oz (77.6 kg)  06/09/11 171 lb 1.2 oz (77.6 kg)  06/09/11 171 lb 1.2 oz (77.6 kg)   Body mass index is 28.47 kg/(m^2).  I/O last 3 completed shifts: In: 480 [P.O.:480] Out: 1100 [Urine:1100]  General - no distress HEENT - no sinus tenderness Cardiac - s1s2 regular, 3/6 SM  Chest - no wheeze Abd - soft, nontender Ext - no edema Neuro - normal strength  Lab Results  Component Value Date   WBC 3.9* 06/07/2011   HGB 12.5 06/07/2011   HCT 36.9 06/07/2011   MCV 96.9 06/07/2011   PLT 248 06/07/2011   BMET    Component Value Date/Time   NA 137 06/09/2011 0530   K 3.9 06/09/2011 0530   CL 106 06/09/2011 0530   CO2 18* 06/09/2011 0530   GLUCOSE 85 06/09/2011 0530   BUN 15 06/09/2011 0530   CREATININE 0.83 06/09/2011 0530   CALCIUM 9.3 06/09/2011 0530   GFRNONAA 81* 06/09/2011 0530   GFRAA >90 06/09/2011 0530      ASSESSMENT/PLAN:  Progressive dyspnea with acute systolic CHF and valvular heart disease -pt would like to defer surgery -med management per cardiology  Hx of smoking with wheezing and recent URI symptoms vs from THC/cocaine abuse -PFT shows moderate obstruction with moderate diffusion defect, and borderline bronchodilator response -wean off prednisone>>change to 10 mg on 2/21 -continue spiriva and symbicort with prn albuterol -will need repeat PFT's as outpt when more stable to assess true severity of lung disease  Will need outpatient eval for sleep disordered breathing given history of systolic heart failure.  [name] Pager:  [phone] 06/09/2011, 3:33 PM

## 2011-06-09 NOTE — Progress Notes (Signed)
Vascular Lab Preliminary  Pre-op Cardiac Surgery  Carotid Findings:  Bilaterally no significant ICA stenosis with antegrade vertebral flow.  Upper Extremity Right Left  Brachial Pressures 113 110  Radial Waveforms Tri Tri  Ulnar Waveforms Tri Tri  Palmar Arch (Allen's Test) Waveform remains normal with radial compression and decreases >50% with ulnar compression Waveform obliterates with radial and ulnar compression   Farrel Demark, RDMS

## 2011-06-09 NOTE — Progress Notes (Signed)
    Subjective:  No chest pain or dyspnea.  Objective:  Vital Signs in the last 24 hours: Temp:  [97.4 F (36.3 C)-98.8 F (37.1 C)] 98.2 F (36.8 C) (02/20 0713) Pulse Rate:  [73-85] 85  (02/20 0713) Resp:  [14-26] 16  (02/20 0713) BP: (93-134)/(54-85) 110/72 mmHg (02/20 0713) SpO2:  [94 %-99 %] 98 % (02/20 0713)  Intake/Output from previous day: 02/19 0701 - 02/20 0700 In: 240 [P.O.:240] Out: 425 [Urine:425]  Physical Exam: Pt is alert and oriented, NAD HEENT: normal Neck: Supple Lungs: CTA bilaterally CV: RRR with 3/6 systolic murmur with diastolic murmur heard throughout Abd: soft, NT, Positive BS, no hepatomegaly Ext: no C/C/E, distal pulses intact and equal Skin: warm/dry no rash   Lab Results:  Basename 06/07/11 1329  WBC 3.9*  HGB 12.5  PLT 248    Basename 06/09/11 0530 06/08/11 0540  NA 137 136  K 3.9 5.3*  CL 106 104  CO2 18* 23  GLUCOSE 85 89  BUN 15 15  CREATININE 0.83 0.72   No results found for this basename: TROPONINI:2,CK,MB:2 in the last 72 hours   Assessment/Plan:  1. Acute on chronic systolic heart failure  2. 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 Syncope  4. URI - appreciate pulmonary management  5. Hypokalemia 6. CAD 7. Substance abuse 8. Fibroids 9. Hypertension  TEE shows severe LV dysfunction, moderate to severe AI, moderate MR and TR. There is moderate AS by gradient but visually valve opens well. Dr Dorris Fetch reviewing for possible AVR. Continue coreg, ASA and lasix. Add ACEI  for LV dysfunction, lisinopril 2.5 mg daily. Volume status improving; follow renal function. Continue pulmonary toilet for URI. Continue ASA and statin for CAD.  Olga Millers, M.D. 06/09/2011, 7:36 AM

## 2011-06-09 NOTE — Progress Notes (Signed)
Patient ID: [name], female   DOB: May 27, 1960, 51 y.o.   MRN: [id number] No c/o today, feels better  BP 105/76  Pulse 76  Temp(Src) 98.7 F (37.1 C) (Oral)  Resp 18  Ht 5\' 5"  (1.651 m)  Wt 77.6 kg (171 lb 1.2 oz)  BMI 28.47 kg/m2  SpO2 97% On exam breathing comfortably, no distress Lungs: clear  - Left ventricle: Systolic function was severely reduced. The estimated ejection fraction was in the range of 25% to 30%. Diffuse hypokinesis with severe apical hypokinesis. - Aortic valve: There was moderate stenosis. Moderate to severe regurgitation. - Mitral valve: Moderate regurgitation. - Left atrium: The atrium was moderately dilated. - Atrial septum: No defect or patent foramen ovale was identified. - Tricuspid valve: Moderate regurgitation. - Pericardium, extracardiac: A small pericardial effusion was identified.     Reviewed TEE with Dr. Jens Som- she definitely has significant valvular pathology. By velocity the AS appears moderate, but the valve leaflets move well on the cross sectional views and the gradient was lower at cath.   AVR +/- mitral repair is certainly an option but not without risk. AVR would definitely improve her AI, but I'm not sure she would have any less gradient across a prosthetic valve than she does across her native valve.  I discussed with [name] that we were still considering options and had yet to arrive at a formal recommendation. She states that she doesn't want to have surgery, but is willing ot take our recommendations into consideration.

## 2011-06-09 NOTE — Progress Notes (Signed)
Pt did have 7 beats of V-tach, pt was asymptomatic, vital signs were stable, T-98.2, P-80, R-16, BP-110/70, O2-96 ra. Will continue to monitor pt.------Lamir Racca. D, rn

## 2011-06-10 DIAGNOSIS — R0602 Shortness of breath: Secondary | ICD-10-CM

## 2011-06-10 DIAGNOSIS — J069 Acute upper respiratory infection, unspecified: Secondary | ICD-10-CM

## 2011-06-10 DIAGNOSIS — R062 Wheezing: Secondary | ICD-10-CM

## 2011-06-10 LAB — BASIC METABOLIC PANEL
CO2: 23 mEq/L (ref 19–32)
CO2: 22 mEq/L (ref 19–32)
Chloride: 104 mEq/L (ref 96–112)
Chloride: 104 mEq/L (ref 96–112)
Creatinine, Ser: 0.75 mg/dL (ref 0.50–1.10)
GFR calc Af Amer: >90 mL/min (ref >90)
Glucose, Bld: 151 mg/dL — ABNORMAL HIGH (ref 70–99)
Potassium: 5.2 mEq/L — ABNORMAL HIGH (ref 3.5–5.1)
Potassium: 3.7 mEq/L (ref 3.5–5.1)
Sodium: 138 mEq/L (ref 135–145)

## 2011-06-10 MED ORDER — ALBUTEROL SULFATE HFA 108 (90 BASE) MCG/ACT IN AERS: 2.0000 | INHALATION_SPRAY | RESPIRATORY_TRACT | Status: DC | PRN

## 2011-06-10 MED ORDER — BUDESONIDE-FORMOTEROL FUMARATE 160-4.5 MCG/ACT IN AERO: 2.0000 | INHALATION_SPRAY | Freq: Two times a day (BID) | RESPIRATORY_TRACT | Status: DC

## 2011-06-10 MED ORDER — TIOTROPIUM BROMIDE MONOHYDRATE 18 MCG IN CAPS: 18.0000 ug | ORAL_CAPSULE | Freq: Every day | RESPIRATORY_TRACT | Status: DC

## 2011-06-10 MED ORDER — PREDNISONE 10 MG PO TABS: ORAL_TABLET | ORAL | Status: DC

## 2011-06-10 NOTE — Progress Notes (Signed)
Notified Annabelle Harman, [place] for [employer] regarding k of 5.2.  Orders to be given to hold potassium and do a BMET.

## 2011-06-10 NOTE — Progress Notes (Addendum)
    Subjective:  No chest pain or dyspnea.  Objective:  Vital Signs in the last 24 hours: Temp:  [97.5 F (36.4 C)-98.7 F (37.1 C)] 97.6 F (36.4 C) (02/21 0634) Pulse Rate:  [65-83] 78  (02/21 0640) Resp:  [18-20] 18  (02/21 0634) BP: (101-116)/(50-81) 111/74 mmHg (02/21 0640) SpO2:  [94 %-99 %] 98 % (02/21 0640) Weight:  [171 lb 1.2 oz (77.6 kg)-171 lb 12.8 oz (77.928 kg)] 171 lb 12.8 oz (77.928 kg) (02/21 1610)  Intake/Output from previous day: 02/20 0701 - 02/21 0700 In: 723 [P.O.:720; I.V.:3] Out: 1600 [Urine:1600]  Physical Exam: Pt is alert and oriented, NAD HEENT: normal Neck: Supple Lungs: CTA bilaterally CV: RRR with 2/6 systolic murmur with diastolic murmur heard throughout Abd: soft, NT, Positive BS, no hepatomegaly Ext: no C/C/E, distal pulses intact and equal Skin: warm/dry no rash   Lab Results:  Basename 06/07/11 1329  WBC 3.9*  HGB 12.5  PLT 248    Basename 06/09/11 0530 06/08/11 0540  NA 137 136  K 3.9 5.3*  CL 106 104  CO2 18* 23  GLUCOSE 85 89  BUN 15 15  CREATININE 0.83 0.72    Assessment/Plan:  1. Acute on chronic systolic heart failure  2. 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 Syncope  4. URI - appreciate pulmonary management  5. Hypokalemia 6. CAD 7. Substance abuse 8. Fibroids 9. Hypertension  TEE shows severe LV dysfunction, moderate to severe AI, moderate MR and TR. There is moderate AS by gradient but visually valve opens well. Some of gradient is most likely related to increased flow from AI, but aortic valve is abnormal. Reviewed with Dr Eden Emms yesterday. Transthoracic echo suggests MR is worse and AI is moderate to severe. Will review with Dr Shirlee Latch today. However, we are leaning towards AVR/MVR. Will discuss with Dr Dorris Fetch. Continue coreg, lisinopril, ASA and lasix.  Volume status improving; follow renal function. Continue pulmonary toilet for URI. Continue ASA and statin for CAD.  Olga Millers,  M.D. 06/10/2011, 7:27 AM    Reviewed cath, echo and TEE with Dr. Shirlee Latch. When sedated (and BP lower) MR appears to be moderate. Aortic valve is clearly abnormal (significant doming noted on long axis views). However, AS appears moderate as does AI. It is not clear that cardiomyopathy is all related to valvular heart disease. Plan DC in AM. Outpatient dobutamine echo to assess AS. If gradient increases with dobutamine, then AS is most likely significant and patient will require surgery. Otherwise, treat medically and proceed with valve surgery later if valvular disease worsens. Olga Millers

## 2011-06-10 NOTE — Progress Notes (Signed)
Patient: [name] DOB: 01-04-1961 MRN: [id number] Date of admission: 05/28/2011         PCCM follow up note    51 y.o. female former smoker admitted on 05/28/2011 with progressive dyspnea, cough, orthopnea, leg edema, and syncope.  Found to have mod/severe AS, CHF.  Tx with ABx and BD's for URI.  Developed wheezing prior to cardiac cath and PCCM consulted 2/14.  UDS positive for THC and cocaine.  Antibiotics: Avelox 2/11 (Bronchitis)>>2/18  Tests/events: 2/10 Echo>>EF 20 to 25%, grade 3 diastolic dysfx, mod AS, severe AR, mod/severe MR, mod LA dilation, PAS 49 mmHg 2/18 PFT>>FEV1 1.29(53%), FEV1% 66, TLC 4.72(90%), DLCO 59%, borderline BD response 2/18 Rt/Lt heart cath>>moderate 2 vessel CAD, severe LV dysfunction, moderate AS with severe AI, mild pulm HTN 2/19 TEE>>EF 25 to 30%, mod AS, mod/severe AI, mod TR  SUBJECTIVE: Breathing much better.  Feels spiriva and symbicort combination working well. Wants to go home.  Concerned about "rushing into" a huge surgery.   OBJECTIVE:  Blood pressure 109/71, pulse 78, temperature 97.6 F (36.4 C), temperature source Oral, resp. rate 18, height 5\' 5"  (1.651 m), weight 171 lb 12.8 oz (77.928 kg), SpO2 97.00%. Wt Readings from Last 3 Encounters:  06/10/11 171 lb 12.8 oz (77.928 kg)  06/10/11 171 lb 12.8 oz (77.928 kg)  06/10/11 171 lb 12.8 oz (77.928 kg)     I/O last 3 completed shifts: In: 723 [P.O.:720; I.V.:3] Out: 1900 [Urine:1900]  Exam -  General - wdwn female, NAD  HEENT - no sinus tenderness Cardiac - s1s2 regular, 3/6 SM  Chest - resps even non labored, no wheeze, few scattered crackles Abd - soft, nontender Ext - no edema Neuro - normal strength   Labs   Lab 06/10/11 1253 06/10/11 0615 06/09/11 0530  NA 138 135 137  K 3.7 5.2* 3.9  CL 104 104 106  CO2 22 23 18*  BUN 14 16 15   CREATININE 0.74 0.75 0.83  GLUCOSE 151* 91 85    Lab 06/07/11 1329 06/04/11 0600  HGB 12.5 12.9  HCT 36.9 38.3  WBC 3.9* 4.7  PLT  248 214     ASSESSMENT/PLAN:  Progressive dyspnea with acute systolic CHF and valvular heart disease -- likely d/c home in am per cards and return for ?AVR -pt would like to defer surgery -med management per cardiology  Hx of smoking with wheezing and recent URI symptoms vs from THC/cocaine abuse -PFT shows moderate obstruction with moderate diffusion defect, and borderline bronchodilator response -wean off prednisone, 10mg  po daily x 4 more days then stop - RX sent -continue spiriva and symbicort with prn albuterol -- RX sent  -outpt f/u with Dr. Craige Cotta with PFT's.  Also to discuss ?outpt sleep eval.  Discharge Orders    Future Appointments: Provider: Department: Dept Phone: Center:   07/08/2011 2:00 PM [facility] [phone] None   07/08/2011 3:00 PM Coralyn Helling, MD [facility] [phone] None       WHITEHEART,KATHRYN, NP 06/10/2011  11:33 AM Pager: [phone]  *Care during the described time interval was provided by me and/or other providers on the critical care team. I have reviewed this patient's available data, including medical history, events of note, physical examination and test results as part of my evaluation.  Pt independently  seen and examined and available cxr's reviewed and I agree with above findings/ imp/ plan   Sandrea Hughs, MD Pulmonary and Critical Care Medicine [facility] [phone]

## 2011-06-11 LAB — BASIC METABOLIC PANEL
BUN: 13 mg/dL (ref 6–23)
CO2: 23 mEq/L (ref 19–32)
Chloride: 103 mEq/L (ref 96–112)
GFR calc non Af Amer: 81 mL/min — ABNORMAL LOW (ref >90)
Glucose, Bld: 83 mg/dL (ref 70–99)
Potassium: 3.3 mEq/L — ABNORMAL LOW (ref 3.5–5.1)
Sodium: 138 mEq/L (ref 135–145)

## 2011-06-11 MED ORDER — ASPIRIN 81 MG PO TBEC: 81.0000 mg | DELAYED_RELEASE_TABLET | Freq: Every day | ORAL | Status: DC

## 2011-06-11 MED ORDER — LISINOPRIL 2.5 MG PO TABS: 2.5000 mg | ORAL_TABLET | Freq: Every day | ORAL | Status: DC

## 2011-06-11 MED ORDER — POTASSIUM CHLORIDE CRYS ER 20 MEQ PO TBCR
40.0000 meq | EXTENDED_RELEASE_TABLET | Freq: Once | ORAL | Status: AC
  Administered 2011-06-11: 40 meq via ORAL

## 2011-06-11 MED ORDER — FUROSEMIDE 40 MG PO TABS: 40.0000 mg | ORAL_TABLET | Freq: Two times a day (BID) | ORAL | Status: DC

## 2011-06-11 MED ORDER — NITROGLYCERIN 0.4 MG SL SUBL: 0.4000 mg | SUBLINGUAL_TABLET | SUBLINGUAL | Status: DC | PRN

## 2011-06-11 MED ORDER — ROSUVASTATIN CALCIUM 10 MG PO TABS: 10.0000 mg | ORAL_TABLET | Freq: Every day | ORAL | Status: DC

## 2011-06-11 NOTE — Progress Notes (Signed)
Subjective:  No chest pain or dyspnea.  Objective:  Vital Signs in the last 24 hours: Temp:  [97.4 F (36.3 C)-98.9 F (37.2 C)] 98.9 F (37.2 C) (02/22 0607) Pulse Rate:  [58-75] 74  (02/22 0612) Resp:  [18-20] 19  (02/22 0612) BP: (102-124)/(66-82) 124/82 mmHg (02/22 0612) SpO2:  [95 %-98 %] 95 % (02/22 0612) Weight:  [171 lb 12.8 oz (77.928 kg)] 171 lb 12.8 oz (77.928 kg) (02/22 0528)  Intake/Output from previous day: 02/21 0701 - 02/22 0700 In: 980 [P.O.:980] Out: 1500 [Urine:1500]  Physical Exam: Pt is alert and oriented, NAD HEENT: normal Neck: Supple Lungs: CTA bilaterally CV: RRR with 2/6 systolic murmur with diastolic murmur heard throughout Abd: soft, NT, Positive BS, no hepatomegaly Ext: no C/C/E, distal pulses intact and equal Skin: warm/dry no rash   Lab Results: No results found for this basename: WBC:2,HGB:2,PLT:2 in the last 72 hours  Basename 06/11/11 0435 06/10/11 1253  NA 138 138  K 3.3* 3.7  CL 103 104  CO2 23 22  GLUCOSE 83 151*  BUN 13 14  CREATININE 0.83 0.74    Assessment/Plan:  1. Acute on chronic systolic heart failure with cardiomyopathy 2. 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 Syncope  4. URI - appreciate pulmonary management  5. Hypokalemia 6. CAD 7. Substance abuse 8. Fibroids 9. Hypertension  I have reviewed patient's transthoracic echo, transesophageal echo and cardiac catheterization with multiple individuals. She has severe LV dysfunction but it is not clear to me that this is all related to her valvular heart disease. Her mitral regurgitation appears worse on transthoracic echo than TEE. This may be related to better blood pressure control/sedation during TEE. She does have moderate aortic insufficiency. She also has moderate aortic stenosis by gradient but visually the valve opens well (though clearly abnormal on long axis views). The decision concerning valve replacement is also complicated by her history of  noncompliance and substance abuse. Intravenous drug use following valve replacement would certainly place her at risk for endocarditis. We have made the decision to proceed with a dobutamine echocardiogram as an outpatient. If her gradient increases with dobutamine then she will most likely require aortic valve replacement. Otherwise she will most likely continue with medical therapy for now with followup echocardiograms in the future. Note patient also presented with syncope and had documented slow nonsustained ventricular tachycardia. Her syncope certainly may have been related to ventricular arrhythmia. I have reviewed this with Dr. Johney Frame. He would like to avoid placing ICD until decision about aortic valve replacement is made. The patient also has a long history of noncompliance with followup and continuing substance abuse. I discussed this extensively with the patient today. I explained that no medical therapy would be beneficial to her unless she is compliant with medications, followup and avoiding substance abuse. I have offered a life vest until dobutamine echo is complete and decision concerning valve replacement is made. This would also be beneficial in the period where she is demonstrating compliance with medical care and avoiding substance abuse. We will try and arrange for this today. If it can be arranged she will be discharged. We will arrange outpatient dobutamine echocardiogram to assess aortic stenosis. This will be scheduled with Junious Dresser. Continue present dose of Lasix, carvedilol and lisinopril. Increase potassium to 20 mEq by mouth twice a day. Check potassium and renal function in one week. Followup with me in 2 weeks. >30 min PA and physician time D2 Arlys John  Deajah Erkkila

## 2011-06-11 NOTE — Progress Notes (Signed)
Notified Dana,PA regarding 21 beats of svt.Marland Kitchenasymptomatic..fitted for life vest and prepared for discharge.

## 2011-06-11 NOTE — Progress Notes (Signed)
06/11/11 1120 Spoke with pt. today and she was feeling better.  She stated the physican had said she could be discharged today after being fit for a LifeVest, however she did not know when that would be.  This NCM called Morrie Sheldon with [employer] and she stated someone would come fit the pt. this afternoon.  Called the pt. to update her with this information.   Tera Mater, RN, BSN [phone]

## 2011-06-11 NOTE — Discharge Summary (Signed)
Discharge Summary   Patient ID: [name] MRN: [id number], DOB/AGE: 51-Mar-1962 51 y.o.  Primary MD: None on file Primary Cardiologist: Olga Millers MD  Admit date: 05/28/2011 D/C date:     06/11/2011      Primary Discharge Diagnoses:  1. Acute on Chronic Systolic CHF 2/2 NICM  - Gently diuresed  - 2D Echo 05/30/11 revealed severely depressed LV systolic function w/ EF 20-25%  2. Valvular Heart Dz w/ AS/AI and MR/TR  - 2D Echo 05/30/11 revealed moderate AS and mod-severe AI, PA pressures mod elevated at  - TEE 06/08/11 revealed EF 25-30%, mod AS, mod-severe AI, and mod MR/TR.  - Evaluated by TCTS AVR +/- mitral valve repair is an option but not without risk  - Decision concerning valve replacement complicated by h/o noncompliance & substance abuse. Will have outpatient dobutamine echo to assess gradient. If it increases with dobutamine she will likely require AVR/MVR, otherwise will continue with medical therapy.   3. Syncope  - May be related to ventricular arrhyhtmia, Slow NSVT on telemetry  - Consulted by EP (Dr. Johney Frame) who would like to avoid ICD placement until decision made regarding AVR  - Will go home with life vest until dobutamine echo completed and decision concerning valve replacement is made  4. Dyspnea  - Evaluated by Pulmonology who felt her dyspnea was multifactorial in the setting of acute systolic CHF, severe AS, pulm HTN +/- recent URI, and likely COPD w/ h/o tobacco abuse.   - Recommended complete 7 day course of Avelox, aggressive diuresis, nebulized bronchodilators, short course of prednisone, spiriva, symbicort, prn albuterol and PFTs.   - PFTs showed moderate obstruction w/ mod diffusion defect, and borderline bronchodilator response.     - Recommendations were made to wean off prednisone, continue MDIs, outpatient sleep evaluation, and repeat PFTs as an outpt when more stable to assess true severity of lung disease  - Will follow up with  Pulmonology as an outpatient  Coronary Artery Disease  - Right/Left Heart cath 06/07/11 revealed moderate 2V CAD, severe LV dysfunction (EF 20%), mod AS, mod-severe AI, and mild pulm htn  - Recommended medical therapy  5. Hypokalemia  - Supplemented  - BMET in one week   Secondary Discharge Diagnoses:  1. HTN 2. Pulmonary HTN 3. Substance abuse - Cocaine, Marijuana, alcohol, tobacco 4. Medical Noncompliance 5. Uterine Fibroids 6. Emphysema 7. Ovarian cyst  Allergies Allergen Reactions  . Penicillins Hives    Diagnostic Studies/Procedures:  05/30/11 - 2D Echocardiogram Study Conclusions:  - Left ventricle: The cavity size was moderately dilated. Systolic function was severely reduced. The estimated ejection fraction was in the range of 20% to 25%. Doppler parameters are consistent with a reversible restrictive pattern, indicative of decreased left ventricular diastolic compliance and/or increased left atrial pressure (grade 3 diastolic dysfunction). - Aortic valve: There was moderate stenosis. Moderate to severe regurgitation. 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 In apical five chamber and parasternal long axisview, leaflets visualizedappear to open well. - Mitral valve: Moderate to severe regurgitation. - Left atrium: The atrium was moderately dilated. - Right ventricle: The cavity size was mildly dilated. Wall thickness was normal. - Right atrium: The atrium was mildly dilated. - Tricuspid valve: Moderate regurgitation. - Pulmonary arteries: Systolic pressure was moderately increased. PA peak pressure: 49mm Hg (S).   06/08/11 - Transesophageal echocardiography Study Conclusions:  - Left ventricle: Systlic function was severely reduced. The estimated ejection  fraction was in the range of 25% to 30%. Diffuse hypokinesis with severe apical hypokinesis. - Aortic valve: There was moderate stenosis. Moderate to  severe regurgitation. - Mitral valve: Moderate regurgitation. - Left atrium: The atrium was moderately dilated. - Atrial septum: No defect or patent foramen ovale was identified. - Tricuspid valve: Moderate regurgitation. - Pericardium, extracardiac: A small pericardial effusion as identified.  06/07/11 - Right Heart Cath, Left Heart Cath, Selective Coronary Angiography, LV angiography Hemodynamics  RA 8/9 with a mean of 8 mm of mercury  RV 41/11 mmHg  PA 41/17 with a mean of 28 mm mercury  PCWP 16/15 with a mean of 13 mm mercury  LV 131/10 mmHg  AO 98/62 with a mean of 75 mmHg  Oxygen saturations:  PA 60%  AO 93%  Cardiac Output (Fick) 4.18 L per minute Thermodilution output: 3.96 L per minute  Cardiac Index (Fick) 2.3 teeters per minute per meter square Thermodilution index: 2.18 L per minute per meter square  Aortic valve mean gradient is 18 mm mercury with an aortic valve area of 1.3 cm square and an index of 0.72.  Mitral valve mean gradient is 4.6 mm mercury. Valve area is 2.55 cm square.  Coronary angiography:  Coronary dominance: right  Left mainstem: Large and normal.  Left anterior descending (LAD): Is a large vessel extending to the apex. There is diffuse 30% disease in the proximal to mid LAD. The first diagonal is a large vessel with 70% stenosis in the proximal to mid vessel.  Left circumflex (LCx): This is a large vessel. Gives rise to 3 marginal branches. The first marginal branch has a segmental 50% disease proximally. The second marginal branch a smaller and has somewhat diffuse disease distally. The left circumflex following the second marginal branch has a 50-60% segmental stenosis.  Right coronary artery (RCA): There is a large dominant vessel. It has scattered irregularities less than 20%.  Left ventriculography: Left ventricular size is increased with severe global hypokinesis. Ejection fraction is estimated at 20%. There is mild mitral insufficiency noted.  Aortic  root angiography demonstrates mild aortic dilatation that is diffuse. There is severe aortic insufficiency graded at 3-4+.  Final Conclusions:  1. Moderate 2 vessel coronary disease.  2. Severe left ventricular dysfunction.  3. Moderate aortic stenosis with moderate to severe aortic insufficiency.  4. Mild pulmonary hypertension.  5. Normal left ventricular filling pressures indicate adequate diuresis.  Recommendations: We will plan on transesophageal echo in the morning to further evaluate her aortic valve. We'll consult surgery for consideration of aortic valve replacement.  06/09/11 -Carotid doppler Bilaterally no significant ICA stenosis with antegrade vertebral flow.  06/07/11 PFT>>FEV1 1.29(53%), FEV1% 66, TLC 4.72(90%), DLCO 59%, borderline BD response  History of Present Illness: 51 y.o.f. with the above problem list who presented to [facility] on [facility]11 with complaints of shortness of breath and syncope.  She had not felt well for over one month and reported an URI in January w/ low grade fevers, and cough. Her general symptoms improved but the productive cough continued and she had increased DOE, lower extremity edema, abdominal distension, orthopnea, and PND. She increased her Lasix from twice a day to three times a day with some improvement in LE edema and cough. The week prior to presentation she reported a non-witnessed syncopal episode while sitting in which she woke up with some blood on her face, but no incontinence. Two days prior to presentation she had another syncopal episode with a  dizziness prodrome which prompted her to come to the ED. She denied chest pain or palpitations.   Hospital Course: In the ED her EKG revealed a sinus rhythm unchanged from prior EKGs, CXR showed cardiac enlargement and pulmonary venous congestion, initial cardiac enzyme was negative, and proBNP was 2735. Urine drug screen positive for Cocaine and THC. It was thought her syncope was related to  over diuresis in the setting of significant aortic stenosis. She was admitted to telemetry for further evaluation and treatment.  Shortly after admission she developed 18 beats of asymptomatic NSVT on telemetry with stable vital signs. Her home Coreg was resumed and electrolytes checked. K+ was noted to be 2.7 for which she received supplementation. Cardiac enzymes were cycled and remained negative. She was gently diuresed and had continued shortness of breath with wheezing for which she was treated with bronchodilators and Avelox (completed 7 day course). Dr. Johney Frame evaluated her and felt her symptoms were concerning for cardiac syncope.  A 2D Echocardiogram was completed revealing severely depressed LV systolic function w/ EF 20-25% w/ moderate AS and mod-severe AI. She had repeat episodes of 7-12 beats of asymptomatic NSVT. Plans for right and left heart cath to assess hemodynamics, AI/AS, and coronary anatomy was postponed due to patients poor pulmonary status. She was evaluated by pulmonology who felt her dyspnea was multifactorial in the setting of acute systolic CHF, severe AS, pulm HTN +/- recent URI, and likely COPD w/ h/o tobacco abuse. They recommended aggressive diuresis, nebulized bronchodilators, short course of prednisone, spiriva, symbicort, prn albuterol and PFTs. PFTs showed moderate obstruction w/ mod diffusion defect, and borderline bronchodilator response. Recommendations were made to wean off prednisone, continue MDIs, and repeat PFTs as an outpt when more stable to assess true severity of lung disease  Her pulmonary status improved and was able to undergo right and left heart catheterization on 06/07/11 revealing moderate 2V CAD, severe LV dysfunction (EF 20%), mod AS, mod-severe AI, and mild pulm htn. Recommendations were made for TEE to further evaluate her aortic valve which revealed EF 25-30%, mod AS, mod-severe AI, and mod MR/TR. TCTS evaluated the patient and reviewed the TEE and felt  AVR +/- mitral valve repair is an option but not without risk. It was not clear that her cardiomyopathy is all related to valvular heart disease. The decision regarding valve replacement are complicated by her history of noncompliance and substance abuse. Plans were made for an outpatient dobutamine echocardiogram to better assess her AS. If the gradient increases with dobutamine, then AS is most likely significant and she will likely require surgery, otherwise she will be treated medically and proceed with valve surgery later if valve disease worsens. For her syncope and slow NSVT, we would like to avoid decisions of ICD placement until decisions about AVR are made and she will therefore go home with a life vest until dobutamine echo complete and decisions regarding surgery are made.  She was seen and evaluated by Dr. Jens Som who felt she was stable for discharge home once life vest placed with plans for follow up as scheduled below.  Discharge Vitals: Blood pressure 92/56, pulse 73, temperature 97.1 F (36.2 C), temperature source Oral, resp. rate 20, height 5\' 5"  (1.651 m), weight 171 lb 12.8 oz (77.928 kg), SpO2 97.00%.  Labs: Component Value Date   WBC 3.9* 06/07/2011   HGB 12.5 06/07/2011   HCT 36.9 06/07/2011   MCV 96.9 06/07/2011   PLT 248 06/07/2011    Lab 06/11/11 0435  NA 138  K 3.3*  CL 103  CO2 23  BUN 13  CREATININE 0.83  CALCIUM 9.0  GLUCOSE 83     05/28/2011 14:46 05/28/2011 21:09 05/29/2011 03:29 05/29/2011 08:33  CK, MB  2.3 2.5 2.8  CK Total  91 88 92  Troponin I <0.30 <0.30 <0.30 <0.30     05/28/2011 14:45 06/08/2011 05:40  Pro B Natriuretic peptide (BNP) 2735.0 (H) 2357.0 (H)     05/28/2011 21:10  TSH 2.261    05/28/2011 17:47  Amphetamines NONE DETECTED  Barbiturates NONE DETECTED  Benzodiazepines NONE DETECTED  Opiates NONE DETECTED  COCAINE POSITIVE (A)  Tetrahydrocannabinol POSITIVE (A)     Discharge Medications   Medication List  As of 06/11/2011  5:55 PM   STOP  taking these medications         amLODipine 10 MG tablet      enalapril 20 MG tablet         TAKE these medications         albuterol 108 (90 BASE) MCG/ACT inhaler   Commonly known as: PROVENTIL HFA;VENTOLIN HFA   Inhale 2 puffs into the lungs every 4 (four) hours as needed for wheezing.      aspirin 81 MG EC tablet   Take 1 tablet (81 mg total) by mouth daily.      budesonide-formoterol 160-4.5 MCG/ACT inhaler   Commonly known as: SYMBICORT   Inhale 2 puffs into the lungs 2 (two) times daily.      carvedilol 25 MG tablet   Commonly known as: COREG   Take 1 tablet (25 mg total) by mouth 2 (two) times daily with a meal.      cetirizine 10 MG tablet   Commonly known as: ZYRTEC   Take 10 mg by mouth daily.      furosemide 40 MG tablet   Commonly known as: LASIX   Take 1 tablet (40 mg total) by mouth 2 (two) times daily. Take two tablets twice daily      lisinopril 2.5 MG tablet   Commonly known as: PRINIVIL,ZESTRIL   Take 1 tablet (2.5 mg total) by mouth daily.      nitroGLYCERIN 0.4 MG SL tablet   Commonly known as: NITROSTAT   Place 1 tablet (0.4 mg total) under the tongue every 5 (five) minutes x 3 doses as needed for chest pain (up to 3 doses).      potassium chloride 20 MEQ packet   Commonly known as: KLOR-CON   Take 20 mEq by mouth 2 (two) times daily.      predniSONE 10 MG tablet   Commonly known as: DELTASONE   1 tab po daily x 4 days then stop      rosuvastatin 10 MG tablet   Commonly known as: CRESTOR   Take 1 tablet (10 mg total) by mouth daily at 6 PM.      tiotropium 18 MCG inhalation capsule   Commonly known as: SPIRIVA   Place 1 capsule (18 mcg total) into inhaler and inhale daily.            Disposition   Discharge Orders    Future Appointments: Provider: Department: Dept Phone: Center:   06/18/2011 11:00 AM [facility] [phone] [facility]   06/22/2011 8:30 AM [facility]  None   06/25/2011  9:45 AM Lewayne Bunting, MD [facility] [phone] [facility]   07/08/2011 2:00 PM [facility]  [phone] None   07/08/2011 3:00 PM Coralyn Helling, MD [facility] [phone] None     Future Orders Please Complete By Expires   Diet - low sodium heart healthy      Increase activity slowly      Discharge instructions      Comments:   **PLEASE REMEMBER TO BRING ALL OF YOUR MEDICATIONS TO EACH OF YOUR FOLLOW-UP OFFICE VISITS.  * KEEP GROIN SITE CLEAN AND DRY. Call the office for any signs of bleedings, pus, swelling, increased pain, or any other concerns. * NO HEAVY LIFTING (>10lbs) OR SEXUAL ACTIVITY X 3 DAYS. * NO SOAKING BATHS, HOT TUBS, POOLS, ETC., X 3 DAYS.  * You will follow up cardiology for blood work and an echocardiogram stress test as scheduled  * You will follow up with pulmonologist (lung specialist) as scheduled * You were started on cholesterol medicine and will need follow up blood work in 6-8wks with your Primary care provider.     Follow-up Information    Follow up with SOOD,VINEET, MD on 07/08/2011. (2:00pm PFT   3:00pm Dr. Craige Cotta )    Contact information:   [address] [employer] [place] [address] [phone]       Follow up with [facility] on 06/22/2011. (Dobutamine Echocardiogram (stress test) 8:30 am - nothing to eat or drink after midnight.  No medicines this morning.)    Contact information:   [phone]      Follow up with Olga Millers, MD on 06/25/2011. (9:45am)    Contact information:   [address] [place], [address] [place] [address] [phone]       Follow up with Selena Batten on 06/18/2011. (Please arrive for Blood work between 8:30 and 4:30)    Contact information:   [facility] [address] [place] [address] [phone]          Outstanding Labs/Studies: 1. Follow up BMET in  one week 2. Outpatient Dobutamine echocardiogram as scheduled above 3. Outpatient PFTs and sleep eval and follow up with Pulmonology 4. Patient will require follow up lipid panel and liver function tests in 6-8 weeks as we initiated a new statin during this hospitalization    Duration of Discharge Encounter: Greater than 30 minutes including physician and PA time.  Signed, Jacquelynn Friend PA-C 06/11/2011, 5:55 PM

## 2011-06-11 NOTE — Progress Notes (Signed)
Received call from nurse that at 5:30 pt had had 21 beats of slow NSVT, asymptomatic with currently stable blood pressure. Patient is being fitted for LifeVest and had known NSVT this admission. K was low this morning but was felt to be appropriately repleted. I discussed the case with Dr. Tenny Craw & Dr. Ladona Ridgel who also feel the patient is stable for discharge.   Dayna Dunn PA-C

## 2011-06-11 NOTE — Discharge Instructions (Signed)
***  PLEASE REMEMBER TO BRING ALL OF YOUR MEDICATIONS TO EACH OF YOUR FOLLOW-UP OFFICE VISITS.  

## 2011-06-12 NOTE — Discharge Summary (Signed)
See progress notes Leone Mobley  

## 2011-06-14 ENCOUNTER — Other Ambulatory Visit (HOSPITAL_COMMUNITY): Payer: Self-pay | Admitting: Cardiology

## 2011-06-14 DIAGNOSIS — I350 Nonrheumatic aortic (valve) stenosis: Secondary | ICD-10-CM

## 2011-06-15 ENCOUNTER — Other Ambulatory Visit: Payer: Self-pay | Admitting: *Deleted

## 2011-06-15 DIAGNOSIS — I5042 Chronic combined systolic (congestive) and diastolic (congestive) heart failure: Secondary | ICD-10-CM

## 2011-06-15 MED ORDER — FUROSEMIDE 40 MG PO TABS: 80.0000 mg | ORAL_TABLET | Freq: Two times a day (BID) | ORAL | Status: DC

## 2011-06-17 ENCOUNTER — Telehealth: Payer: Self-pay | Admitting: Cardiology

## 2011-06-17 DIAGNOSIS — Z79899 Other long term (current) drug therapy: Secondary | ICD-10-CM

## 2011-06-17 DIAGNOSIS — E7800 Pure hypercholesterolemia, unspecified: Secondary | ICD-10-CM

## 2011-06-17 MED ORDER — PRAVASTATIN SODIUM 40 MG PO TABS: 40.0000 mg | ORAL_TABLET | Freq: Every evening | ORAL | Status: DC

## 2011-06-17 NOTE — Telephone Encounter (Signed)
Spoke with pt, she was given a script for crestor 10 mg and she is unable to afford and requested a cheaper statin. Will forward for dr Jens Som review

## 2011-06-17 NOTE — Telephone Encounter (Signed)
New msg Pt said the cholesterol med that was called in for her is too expensive Please call her back

## 2011-06-17 NOTE — Telephone Encounter (Signed)
Patient request return call from nurse DM, she can be reached at hm# [phone]

## 2011-06-17 NOTE — Telephone Encounter (Signed)
Dc crestor; pravachol 40 mg daily; lipids and liver in six weeks Olga Millers

## 2011-06-17 NOTE — Telephone Encounter (Signed)
Left message for pt of new meds and need for labs in 6 weeks

## 2011-06-18 ENCOUNTER — Ambulatory Visit (INDEPENDENT_AMBULATORY_CARE_PROVIDER_SITE_OTHER): Payer: Self-pay | Admitting: *Deleted

## 2011-06-18 DIAGNOSIS — E7800 Pure hypercholesterolemia, unspecified: Secondary | ICD-10-CM

## 2011-06-18 DIAGNOSIS — Z79899 Other long term (current) drug therapy: Secondary | ICD-10-CM

## 2011-06-18 LAB — LIPID PANEL
Cholesterol: 212 mg/dL — ABNORMAL HIGH (ref 0–200)
Total CHOL/HDL Ratio: 3
Triglycerides: 293 mg/dL — ABNORMAL HIGH (ref 0.0–149.0)

## 2011-06-18 LAB — HEPATIC FUNCTION PANEL
ALT: 20 U/L (ref 0–35)
Albumin: 3.9 g/dL (ref 3.5–5.2)
Total Bilirubin: 0.4 mg/dL (ref 0.3–1.2)

## 2011-06-18 NOTE — Telephone Encounter (Signed)
Spoke with pt, aware of new med and need for labs

## 2011-06-22 ENCOUNTER — Other Ambulatory Visit (HOSPITAL_COMMUNITY): Payer: Self-pay

## 2011-06-23 ENCOUNTER — Ambulatory Visit (HOSPITAL_COMMUNITY): Payer: Medicaid Other

## 2011-06-25 ENCOUNTER — Ambulatory Visit (INDEPENDENT_AMBULATORY_CARE_PROVIDER_SITE_OTHER): Payer: Self-pay | Admitting: Cardiology

## 2011-06-25 ENCOUNTER — Ambulatory Visit (HOSPITAL_COMMUNITY): Payer: Medicaid Other | Attending: Cardiology

## 2011-06-25 ENCOUNTER — Ambulatory Visit (HOSPITAL_BASED_OUTPATIENT_CLINIC_OR_DEPARTMENT_OTHER): Payer: Medicaid Other | Admitting: Cardiology

## 2011-06-25 ENCOUNTER — Other Ambulatory Visit (INDEPENDENT_AMBULATORY_CARE_PROVIDER_SITE_OTHER): Payer: Self-pay

## 2011-06-25 ENCOUNTER — Encounter: Payer: Self-pay | Admitting: Cardiology

## 2011-06-25 VITALS — Ht 65.5 in | Wt 170.0 lb

## 2011-06-25 VITALS — BP 140/80 | HR 72 | Wt 169.0 lb

## 2011-06-25 DIAGNOSIS — I359 Nonrheumatic aortic valve disorder, unspecified: Secondary | ICD-10-CM

## 2011-06-25 DIAGNOSIS — E785 Hyperlipidemia, unspecified: Secondary | ICD-10-CM | POA: Insufficient documentation

## 2011-06-25 DIAGNOSIS — R42 Dizziness and giddiness: Secondary | ICD-10-CM | POA: Insufficient documentation

## 2011-06-25 DIAGNOSIS — R0989 Other specified symptoms and signs involving the circulatory and respiratory systems: Secondary | ICD-10-CM

## 2011-06-25 DIAGNOSIS — R55 Syncope and collapse: Secondary | ICD-10-CM | POA: Insufficient documentation

## 2011-06-25 DIAGNOSIS — E876 Hypokalemia: Secondary | ICD-10-CM

## 2011-06-25 DIAGNOSIS — I2510 Atherosclerotic heart disease of native coronary artery without angina pectoris: Secondary | ICD-10-CM

## 2011-06-25 DIAGNOSIS — R079 Chest pain, unspecified: Secondary | ICD-10-CM | POA: Insufficient documentation

## 2011-06-25 DIAGNOSIS — I10 Essential (primary) hypertension: Secondary | ICD-10-CM | POA: Insufficient documentation

## 2011-06-25 DIAGNOSIS — Z9189 Other specified personal risk factors, not elsewhere classified: Secondary | ICD-10-CM

## 2011-06-25 DIAGNOSIS — I472 Ventricular tachycardia: Secondary | ICD-10-CM

## 2011-06-25 DIAGNOSIS — I350 Nonrheumatic aortic (valve) stenosis: Secondary | ICD-10-CM

## 2011-06-25 LAB — BASIC METABOLIC PANEL
BUN: 17 mg/dL (ref 6–23)
CO2: 25 mEq/L (ref 19–32)
Chloride: 104 mEq/L (ref 96–112)
Creatinine, Ser: 0.8 mg/dL (ref 0.4–1.2)
Glucose, Bld: 116 mg/dL — ABNORMAL HIGH (ref 70–99)

## 2011-06-25 MED ORDER — SODIUM CHLORIDE 0.9 % IV SOLN
20.0000 ug/kg | Freq: Once | INTRAVENOUS | Status: AC
  Administered 2011-06-25: 92760 ug/h via INTRAVENOUS

## 2011-06-25 NOTE — Assessment & Plan Note (Signed)
I have reviewed patient's transthoracic echo, transesophageal echo and cardiac catheterization with multiple individuals previously. She has severe LV dysfunction but it is not clear to me that this is all related to her valvular heart disease. Her mitral regurgitation appears worse on transthoracic echo than TEE. This may be related to better blood pressure control/sedation during TEE. She does have moderate aortic insufficiency. She also has moderate aortic stenosis by gradient but visually the valve opens well (though clearly abnormal on long axis views). The decision concerning valve replacement is also complicated by her history of noncompliance and substance abuse. Intravenous drug use following valve replacement would certainly place her at risk for endocarditis. We have made the decision to proceed with a dobutamine echocardiogram (which will be performed today). If her gradient increases with dobutamine then she will most likely require aortic valve replacement. Otherwise she will most likely continue with medical therapy for now with followup echocardiograms in the future.  Continue present dose of Lasix, carvedilol and lisinopril. Check potassium and renal function in one week. Followup with me in 1 week.

## 2011-06-25 NOTE — Assessment & Plan Note (Signed)
Patient presented with syncope and has severe LV dysfunction and nonsustained ventricular tachycardia on telemetry. Her life vest is in place. Once we make a decision about aortic valve replacement she will followup with electrophysiology. If she needs aortic valve replacement her ICD will placed following. If she does not require aortic valve replacement now that she will require ICD. Continue beta blocker.

## 2011-06-25 NOTE — Assessment & Plan Note (Signed)
Continue aspirin and statin. 

## 2011-06-25 NOTE — Assessment & Plan Note (Signed)
I again had long discussions with patient concerning avoiding tobacco, cocaine and alcohol.

## 2011-06-25 NOTE — Progress Notes (Signed)
HPI: [name] is a pleasant female who returns for followup of aortic stenosis and CAD. The patient has a long history of severe hypertension and a cardiomyopathy. Her previous workup in 2005 suggested the possibility of amyloid. At that time, she had a normal ferritin level, normal ACE level, normal TSH, and negative HIV. A fat pad biopsy to rule out amyloidosis was negative. However, we also scheduled her to have free kappa light chains which were elevated at 4.16 and her lambda free light chains were 0.97 which was normal. However, her kappa to lambda free light chain ratio was elevated at 4.29. She was seen by hematology oncology in September of 2010 for question amyloidosis. Apparently W/U negative. Note there has been issues with followup as she has not returned for extended lengths of time. Patient admitted with congestive heart failure and URI as well as syncope in February 2012. Repeat echocardiogram showed an ejection fraction of 20-25%, restrictive filling pattern, moderate aortic stenosis with moderate to severe aortic insufficiency. 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 There was moderate to severe mitral regurgitation. There was moderate left atrial enlargement, mild right ventricular and right atrial enlargement and moderate tricuspid regurgitation. Transesophageal echocardiogram showed an ejection fraction of 25-30%, moderate aortic stenosis and moderate to severe aortic insufficiency. There was moderate mitral regurgitation and moderate left atrial enlargement. There was moderate tricuspid regurgitation and a small pericardial effusion. Cardiac catheterization in February of 2012 showed a 70% first diagonal, a 50% first marginal, a 50-60% left circumflex and a 20% right coronary artery. Ejection fraction was 20%. There was mild mitral regurgitation and severe aortic insufficiency. There was mild pulmonary hypertension. Dr Dorris Fetch saw the patient and we were not convinced that her aortic  valve disease was the complete cause of her cardiomyopathy. We therefore planned outpatient dobutamine echo. If severe aortic stenosis then we would proceed with aortic valve replacement. Patient discharged with a lifejacket given syncope on admission, NSVT on telemetry and severe LV dysfunction. Abdominal CT showed fibroids. Since DC, the patient denies any dyspnea on exertion, orthopnea, PND, pedal edema, palpitations, syncope or chest pain. She has not received a shock from her vest.    Current Outpatient Prescriptions  Medication Sig Dispense Refill  . albuterol (PROVENTIL HFA;VENTOLIN HFA) 108 (90 BASE) MCG/ACT inhaler Inhale 2 puffs into the lungs every 4 (four) hours as needed for wheezing.  1 Inhaler  0  . aspirin EC 81 MG EC tablet Take 1 tablet (81 mg total) by mouth daily.      . budesonide-formoterol (SYMBICORT) 160-4.5 MCG/ACT inhaler Inhale 2 puffs into the lungs 2 (two) times daily.  1 Inhaler  0  . carvedilol (COREG) 25 MG tablet Take 1 tablet (25 mg total) by mouth 2 (two) times daily with a meal.  60 tablet  12  . cetirizine (ZYRTEC) 10 MG tablet Take 10 mg by mouth daily.      . furosemide (LASIX) 40 MG tablet Take 2 tablets (80 mg total) by mouth 2 (two) times daily. Take two tablets twice daily  120 tablet  6  . lisinopril (PRINIVIL,ZESTRIL) 2.5 MG tablet Take 1 tablet (2.5 mg total) by mouth daily.  30 tablet  6  . nitroGLYCERIN (NITROSTAT) 0.4 MG SL tablet Place 1 tablet (0.4 mg total) under the tongue every 5 (five) minutes x 3 doses as needed for chest pain (up to 3 doses).  25 tablet  3  . potassium chloride (KLOR-CON) 20 MEQ packet Take 20 mEq by  mouth 2 (two) times daily.  60 tablet  12  . pravastatin (PRAVACHOL) 40 MG tablet Take 1 tablet (40 mg total) by mouth every evening.  30 tablet  11  . tiotropium (SPIRIVA) 18 MCG inhalation capsule Place 1 capsule (18 mcg total) into inhaler and inhale daily.  30 capsule  0     Past Medical History  Diagnosis Date  . CHF  (congestive heart failure)   . Pulmonary nodule, left     f/u chest CT 2/12: interval clearing of RUL air space nodule, borderline enlarged mediastinal lymph nodes stable, pul. arterial HTN  . Emphysema   . Ovarian cyst     ovarian cystic mass  . Cocaine abuse   . Alcohol abuse   . Hypertension     pulmonary  . 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 Mitral stenosis   . Nonischemic cardiomyopathy     a. cath 6/08: pLAD 30%, mLAD 30%, oCFX 40%, dCVS 40%; OM 50%; dRCA 40%;    b. echo 06/01/10: severe LVH; EF 45-50%; inf HK; mild to mod AS (mean 30), mod. AI; mild to mod MS; mod MR; severe LAE, mod TR, mod PR, PASP 59;  c.  w/u for Amyloid done in past  . Tubal pregnancy     Hx  . Cannabis abuse   . Fibroids     abd. CT 2/12: large fibroid uterus  . Personal history of noncompliance with medical treatment, presenting hazards to health   . Pulmonary HTN     Past Surgical History  Procedure Date  . Tubal ligation   . Cardiac catheterization 2008  . Tee without cardioversion 06/08/2011    Procedure: TRANSESOPHAGEAL ECHOCARDIOGRAM (TEE);  Surgeon: Lewayne Bunting, MD;  Location: [facility] ENDOSCOPY;  Service: Cardiovascular;  Laterality: N/A;    History   Social History  . Marital Status: Single    Spouse Name: N/A    Number of Children: N/A  . Years of Education: N/A   Occupational History  . Not on file.   Social History Main Topics  . Smoking status: Former Smoker -- 1.0 packs/day for 10 years    Types: Cigarettes    Quit date: 05/27/2010  . Smokeless tobacco: Former Neurosurgeon    Quit date: 05/27/2010  . Alcohol Use: 0.6 oz/week    1 Shots of liquor per week  . Drug Use: Yes    Special: Marijuana     last smoked tonight  . Sexually Active: Yes   Other Topics Concern  . Not on file   Social History Narrative   Single no childrenTobacco Use - Hx of x-20 yearsHistory Cocaine and Mariguana use    ROS: no fevers or chills, productive cough, hemoptysis,  dysphasia, odynophagia, melena, hematochezia, dysuria, hematuria, rash, seizure activity, orthopnea, PND, pedal edema, claudication. Remaining systems are negative.  Physical Exam: [name] well-nourished in no acute distress.  Skin is warm and dry.  HEENT is normal.  Neck is supple. No thyromegaly.  Chest is clear to auscultation with normal expansion.  Cardiovascular exam is regular rate and rhythm. 3/6 systolic murmur left sternal border. 2/6 diastolic murmur. Abdominal exam nontender or distended. No masses palpated. Extremities show no edema. neuro grossly intact

## 2011-06-25 NOTE — Assessment & Plan Note (Signed)
Blood pressure controlled. Continue present medications. 

## 2011-06-25 NOTE — Patient Instructions (Signed)
Your physician recommends that you schedule a follow-up appointment in: one week

## 2011-06-29 ENCOUNTER — Telehealth: Payer: Self-pay | Admitting: *Deleted

## 2011-06-29 NOTE — Telephone Encounter (Signed)
Pt has been referred to Dr. Dorris Fetch based on results of echo and his office will be contacting her.  I called to give pt this information.  Left message to call back

## 2011-06-29 NOTE — Telephone Encounter (Signed)
Pt was notified.  

## 2011-06-29 NOTE — Telephone Encounter (Signed)
Fu call °Patient returning your call °

## 2011-07-02 ENCOUNTER — Encounter: Payer: Self-pay | Admitting: Cardiology

## 2011-07-02 ENCOUNTER — Ambulatory Visit (INDEPENDENT_AMBULATORY_CARE_PROVIDER_SITE_OTHER): Payer: Self-pay | Admitting: Cardiology

## 2011-07-02 VITALS — BP 110/76 | HR 76 | Ht 65.5 in | Wt 171.8 lb

## 2011-07-02 DIAGNOSIS — I10 Essential (primary) hypertension: Secondary | ICD-10-CM

## 2011-07-02 DIAGNOSIS — I428 Other cardiomyopathies: Secondary | ICD-10-CM

## 2011-07-02 DIAGNOSIS — I2510 Atherosclerotic heart disease of native coronary artery without angina pectoris: Secondary | ICD-10-CM

## 2011-07-02 DIAGNOSIS — R55 Syncope and collapse: Secondary | ICD-10-CM

## 2011-07-02 DIAGNOSIS — I359 Nonrheumatic aortic valve disorder, unspecified: Secondary | ICD-10-CM

## 2011-07-02 DIAGNOSIS — I350 Nonrheumatic aortic (valve) stenosis: Secondary | ICD-10-CM

## 2011-07-02 DIAGNOSIS — Z9189 Other specified personal risk factors, not elsewhere classified: Secondary | ICD-10-CM

## 2011-07-02 NOTE — Assessment & Plan Note (Signed)
Continue aspirin and statin. 

## 2011-07-02 NOTE — Assessment & Plan Note (Signed)
Patient with history of syncope, reduced LV function, and nonsustained ventricular tachycardia on telemetry. We will continue with her life vest. I reviewed this with Dr. Johney Frame. Following valve replacement she will need to remain on her life vest. We would plan to repeat echocardiogram 8-12 weeks after her surgery. Hopefully LV function will have improved. If not she would require ICD at that time.

## 2011-07-02 NOTE — Assessment & Plan Note (Signed)
Continue ACE inhibitor, beta blocker and present dose of diuretic. Patient euvolemic on examination.

## 2011-07-02 NOTE — Progress Notes (Signed)
HPI:[name] is a pleasant female who returns for followup of aortic stenosis and CAD. The patient has a long history of severe hypertension and a cardiomyopathy. Her previous workup in 2005 suggested the possibility of amyloid. At that time, she had a normal ferritin level, normal ACE level, normal TSH, and negative HIV. A fat pad biopsy to rule out amyloidosis was negative. However, we also scheduled her to have free kappa light chains which were elevated at 4.16 and her lambda free light chains were 0.97 which was normal. However, her kappa to lambda free light chain ratio was elevated at 4.29. She was seen by hematology oncology in September of 2010 for question amyloidosis. Apparently W/U negative. Note there has been issues with followup as she has not returned for extended lengths of time.  Patient admitted with congestive heart failure and URI as well as syncope in February 2013. Repeat echocardiogram showed an ejection fraction of 20-25%, restrictive filling pattern, moderate aortic stenosis with moderate to severe aortic insufficiency. 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 There was moderate to severe mitral regurgitation. There was moderate left atrial enlargement, mild right ventricular and right atrial enlargement and moderate tricuspid regurgitation. Transesophageal echocardiogram showed an ejection fraction of 25-30%, moderate aortic stenosis and moderate to severe aortic insufficiency. There was moderate mitral regurgitation and moderate left atrial enlargement. There was moderate tricuspid regurgitation and a small pericardial effusion. Cardiac catheterization in February of 2012 showed a 70% first diagonal, a 50% first marginal, a 50-60% left circumflex and a 20% right coronary artery. Ejection fraction was 20%. There was mild mitral regurgitation and severe aortic insufficiency. There was mild pulmonary hypertension. Dr Dorris Fetch saw the patient and we were not convinced that her aortic  valve disease was the complete cause of her cardiomyopathy. We therefore planned outpatient dobutamine echo. If severe aortic stenosis then we would proceed with aortic valve replacement. Patient discharged with a lifejacket given syncope on admission, NSVT on telemetry and severe LV dysfunction. Abdominal CT showed fibroids. Dobutamine echocardiogram in March of 2013 showed depressed LV function but severe aortic stenosis. Her mean gradient increased from 33-39 mm of mercury. Her aortic valve area at peak infusion was 0.77 cm. Since I last saw her, the patient denies any dyspnea on exertion, orthopnea, PND, pedal edema, palpitations, syncope or chest pain.    Current Outpatient Prescriptions  Medication Sig Dispense Refill  . albuterol (PROVENTIL HFA;VENTOLIN HFA) 108 (90 BASE) MCG/ACT inhaler Inhale 2 puffs into the lungs every 4 (four) hours as needed for wheezing.  1 Inhaler  0  . aspirin EC 81 MG EC tablet Take 1 tablet (81 mg total) by mouth daily.      . budesonide-formoterol (SYMBICORT) 160-4.5 MCG/ACT inhaler Inhale 2 puffs into the lungs 2 (two) times daily.  1 Inhaler  0  . carvedilol (COREG) 25 MG tablet Take 1 tablet (25 mg total) by mouth 2 (two) times daily with a meal.  60 tablet  12  . cetirizine (ZYRTEC) 10 MG tablet Take 10 mg by mouth daily.      . furosemide (LASIX) 40 MG tablet Take 2 tablets (80 mg total) by mouth 2 (two) times daily. Take two tablets twice daily  120 tablet  6  . lisinopril (PRINIVIL,ZESTRIL) 2.5 MG tablet Take 1 tablet (2.5 mg total) by mouth daily.  30 tablet  6  . nitroGLYCERIN (NITROSTAT) 0.4 MG SL tablet Place 1 tablet (0.4 mg total) under the tongue every 5 (five) minutes x 3  doses as needed for chest pain (up to 3 doses).  25 tablet  3  . potassium chloride (KLOR-CON) 20 MEQ packet Take 20 mEq by mouth 2 (two) times daily.  60 tablet  12  . pravastatin (PRAVACHOL) 40 MG tablet Take 1 tablet (40 mg total) by mouth every evening.  30 tablet  11      Past Medical History  Diagnosis Date  . CHF (congestive heart failure)   . Pulmonary nodule, left     f/u chest CT 2/12: interval clearing of RUL air space nodule, borderline enlarged mediastinal lymph nodes stable, pul. arterial HTN  . Emphysema   . Ovarian cyst     ovarian cystic mass  . Cocaine abuse   . Alcohol abuse   . Hypertension     pulmonary  . 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 Mitral stenosis   . Nonischemic cardiomyopathy     a. cath 6/08: pLAD 30%, mLAD 30%, oCFX 40%, dCVS 40%; OM 50%; dRCA 40%;    b. echo 06/01/10: severe LVH; EF 45-50%; inf HK; mild to mod AS (mean 30), mod. AI; mild to mod MS; mod MR; severe LAE, mod TR, mod PR, PASP 59;  c.  w/u for Amyloid done in past  . Tubal pregnancy     Hx  . Cannabis abuse   . Fibroids     abd. CT 2/12: large fibroid uterus  . Personal history of noncompliance with medical treatment, presenting hazards to health   . Pulmonary HTN     Past Surgical History  Procedure Date  . Tubal ligation   . Cardiac catheterization 2008  . Tee without cardioversion 06/08/2011    Procedure: TRANSESOPHAGEAL ECHOCARDIOGRAM (TEE);  Surgeon: Lewayne Bunting, MD;  Location[facility] ENDOSCOPY;  Service: Cardiovascular;  Laterality: N/A;    History   Social History  . Marital Status: Single    Spouse Name: N/A    Number of Children: N/A  . Years of Education: N/A   Occupational History  . Not on file.   Social History Main Topics  . Smoking status: Former Smoker -- 1.0 packs/day for 10 years    Types: Cigarettes    Quit date: 05/27/2010  . Smokeless tobacco: Former Neurosurgeon    Quit date: 05/27/2010  . Alcohol Use: 0.6 oz/week    1 Shots of liquor per week  . Drug Use: Yes    Special: Marijuana     last smoked tonight  . Sexually Active: Yes   Other Topics Concern  . Not on file   Social History Narrative   Single no childrenTobacco Use - Hx of x-20 yearsHistory Cocaine and Mariguana use    ROS: no  fevers or chills, productive cough, hemoptysis, dysphasia, odynophagia, melena, hematochezia, dysuria, hematuria, rash, seizure activity, orthopnea, PND, pedal edema, claudication. Remaining systems are negative.  Physical Exam: [name] well-nourished in no acute distress.  Skin is warm and dry.  HEENT is normal.  Neck is supple. No thyromegaly.  Chest is clear to auscultation with normal expansion.  Cardiovascular exam is regular rate and rhythm. 2/6 systolic and diastolic murmur left sternal border. Abdominal exam nontender or distended. No masses palpated. Extremities show no edema. neuro grossly intact

## 2011-07-02 NOTE — Assessment & Plan Note (Signed)
Blood pressure controlled. Continue present medications. 

## 2011-07-02 NOTE — Patient Instructions (Signed)
Your physician recommends that you schedule a follow-up appointment in: MAY

## 2011-07-02 NOTE — Assessment & Plan Note (Signed)
Dobutamine echocardiogram shows severe aortic stenosis with infusion. I therefore feel that she will require aortic valve replacement. Her mitral and tricuspid valves will also need to be reviewed at the time of surgery. She may need surgical repair of those valves as well. She has an appointment to see Dr. Dorris Fetch on March 25.

## 2011-07-02 NOTE — Assessment & Plan Note (Signed)
I again reviewed the importance of avoiding all substance abuse.

## 2011-07-05 ENCOUNTER — Telehealth: Payer: Self-pay | Admitting: Cardiology

## 2011-07-05 NOTE — Telephone Encounter (Signed)
Spoke with pt, her vest has been replaced and she feels fine at present. She will cont to report problems.

## 2011-07-05 NOTE — Telephone Encounter (Signed)
Please return call to patient at hm# [phone]  Patient received shock from life vest at 6:30am.  Patient  Reporting information to Dr. Jens Som, she can be reached at hm# [phone] for additional information

## 2011-07-08 ENCOUNTER — Ambulatory Visit (INDEPENDENT_AMBULATORY_CARE_PROVIDER_SITE_OTHER): Payer: Self-pay | Admitting: Pulmonary Disease

## 2011-07-08 ENCOUNTER — Ambulatory Visit: Payer: Self-pay | Admitting: Thoracic Surgery (Cardiothoracic Vascular Surgery)

## 2011-07-08 ENCOUNTER — Encounter: Payer: Self-pay | Admitting: Pulmonary Disease

## 2011-07-08 VITALS — BP 108/68 | HR 84 | Temp 97.9°F | Ht 66.0 in | Wt 173.0 lb

## 2011-07-08 DIAGNOSIS — J438 Other emphysema: Secondary | ICD-10-CM

## 2011-07-08 DIAGNOSIS — R0602 Shortness of breath: Secondary | ICD-10-CM

## 2011-07-08 LAB — PULMONARY FUNCTION TEST

## 2011-07-08 NOTE — Progress Notes (Signed)
Chief Complaint  Patient presents with  . HFU    w/ PFT. Pt states her breathing has been okay. denies any cough, wheezing, chest tightness. Pt states she fell monday and hit her chest and it's very sore and hard to breathe    History of Present Illness: [name] is a 51 y.o. female former smoker with COPD.  She has been feeling better.  She does not feel like she needs to use inhalers on a regular basis.  She does not always use her symbicort.    She is being evaluated for valve replacement surgery.  2/18 PFT>>FEV1 1.29(53%), FEV1% 66, TLC 4.72(90%), DLCO 59%, borderline BD response    Past Medical History  Diagnosis Date  . CHF (congestive heart failure)   . Pulmonary nodule, left     f/u chest CT 2/12: interval clearing of RUL air space nodule, borderline enlarged mediastinal lymph nodes stable, pul. arterial HTN  . Emphysema   . Ovarian cyst     ovarian cystic mass  . Cocaine abuse   . Alcohol abuse   . Hypertension     pulmonary  . 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 Mitral stenosis   . Nonischemic cardiomyopathy     a. cath 6/08: pLAD 30%, mLAD 30%, oCFX 40%, dCVS 40%; OM 50%; dRCA 40%;    b. echo 06/01/10: severe LVH; EF 45-50%; inf HK; mild to mod AS (mean 30), mod. AI; mild to mod MS; mod MR; severe LAE, mod TR, mod PR, PASP 59;  c.  w/u for Amyloid done in past  . Tubal pregnancy     Hx  . Cannabis abuse   . Fibroids     abd. CT 2/12: large fibroid uterus  . Personal history of noncompliance with medical treatment, presenting hazards to health   . Pulmonary HTN     Past Surgical History  Procedure Date  . Tubal ligation   . Cardiac catheterization 2008  . Tee without cardioversion 06/08/2011    Procedure: TRANSESOPHAGEAL ECHOCARDIOGRAM (TEE);  Surgeon: Lewayne Bunting, MD;  Location: [facility] ENDOSCOPY;  Service: Cardiovascular;  Laterality: N/A;    Allergies  Allergen Reactions  . Penicillins Hives    Physical Exam:  Blood  pressure 108/68, pulse 84, temperature 97.9 F (36.6 C), temperature source Oral, height 5\' 6"  (1.676 m), weight 173 lb (78.472 kg), SpO2 93.00%. Body mass index is 27.92 kg/(m^2). Wt Readings from Last 2 Encounters:  07/08/11 173 lb (78.472 kg)  07/02/11 171 lb 12.8 oz (77.928 kg)    General - No distress HEENT - no sinus tenderness, no oral exudate, no LAN Cardiac - s1s2 regular, 3/6 SM Chest - no wheeze/rales Abdomen - soft, nontender Extremities - no edema Skin - no rashes Neurologic - normal strength Psychiatric - normal mood, behavior   Assessment/Plan:  Outpatient Encounter Prescriptions as of 07/08/2011  Medication Sig Dispense Refill  . albuterol (PROVENTIL HFA;VENTOLIN HFA) 108 (90 BASE) MCG/ACT inhaler Inhale 2 puffs into the lungs every 4 (four) hours as needed for wheezing.  1 Inhaler  0  . aspirin EC 81 MG EC tablet Take 1 tablet (81 mg total) by mouth daily.      . budesonide-formoterol (SYMBICORT) 160-4.5 MCG/ACT inhaler Inhale 2 puffs into the lungs 2 (two) times daily.  1 Inhaler  0  . carvedilol (COREG) 25 MG tablet Take 1 tablet (25 mg total) by mouth 2 (two) times daily with a meal.  60 tablet  12  .  cetirizine (ZYRTEC) 10 MG tablet Take 10 mg by mouth daily.      . furosemide (LASIX) 40 MG tablet Take 2 tablets (80 mg total) by mouth 2 (two) times daily. Take two tablets twice daily  120 tablet  6  . lisinopril (PRINIVIL,ZESTRIL) 2.5 MG tablet Take 1 tablet (2.5 mg total) by mouth daily.  30 tablet  6  . nitroGLYCERIN (NITROSTAT) 0.4 MG SL tablet Place 1 tablet (0.4 mg total) under the tongue every 5 (five) minutes x 3 doses as needed for chest pain (up to 3 doses).  25 tablet  3  . potassium chloride (KLOR-CON) 20 MEQ packet Take 20 mEq by mouth 2 (two) times daily.  60 tablet  12  . pravastatin (PRAVACHOL) 40 MG tablet Take 1 tablet (40 mg total) by mouth every evening.  30 tablet  11    Josey Dettmann Pager:  [phone] 07/08/2011, 3:30 PM

## 2011-07-08 NOTE — Progress Notes (Signed)
PFT done today. 

## 2011-07-08 NOTE — Assessment & Plan Note (Signed)
She has severe airflow obstruction and bronchodilator responsiveness on PFT.  She reports that she is no longer smoking or using other substances.  She is not using inhalers on a regular basis, and does not think she needs to at this time.  Will have her stop symbicort, and continue prn albuterol.  She is to call if her breathing gets worse.

## 2011-07-08 NOTE — Patient Instructions (Signed)
Follow up in 6 months 

## 2011-07-13 ENCOUNTER — Ambulatory Visit: Payer: Self-pay | Admitting: Thoracic Surgery (Cardiothoracic Vascular Surgery)

## 2011-07-14 ENCOUNTER — Encounter: Payer: Self-pay | Admitting: Pulmonary Disease

## 2011-07-22 ENCOUNTER — Ambulatory Visit (INDEPENDENT_AMBULATORY_CARE_PROVIDER_SITE_OTHER): Payer: Self-pay | Admitting: Thoracic Surgery (Cardiothoracic Vascular Surgery)

## 2011-07-22 ENCOUNTER — Other Ambulatory Visit: Payer: Self-pay

## 2011-07-22 ENCOUNTER — Encounter: Payer: Self-pay | Admitting: Thoracic Surgery (Cardiothoracic Vascular Surgery)

## 2011-07-22 ENCOUNTER — Other Ambulatory Visit: Payer: Self-pay | Admitting: Thoracic Surgery (Cardiothoracic Vascular Surgery)

## 2011-07-22 VITALS — BP 106/73 | HR 75 | Resp 20 | Ht 65.0 in | Wt 171.0 lb

## 2011-07-22 DIAGNOSIS — I079 Rheumatic tricuspid valve disease, unspecified: Secondary | ICD-10-CM

## 2011-07-22 DIAGNOSIS — I351 Nonrheumatic aortic (valve) insufficiency: Secondary | ICD-10-CM

## 2011-07-22 DIAGNOSIS — I059 Rheumatic mitral valve disease, unspecified: Secondary | ICD-10-CM

## 2011-07-22 DIAGNOSIS — I359 Nonrheumatic aortic valve disorder, unspecified: Secondary | ICD-10-CM

## 2011-07-22 DIAGNOSIS — I350 Nonrheumatic aortic (valve) stenosis: Secondary | ICD-10-CM

## 2011-07-23 ENCOUNTER — Encounter (HOSPITAL_COMMUNITY): Payer: Self-pay | Admitting: Pharmacy Technician

## 2011-08-02 ENCOUNTER — Ambulatory Visit (HOSPITAL_COMMUNITY)
Admission: RE | Admit: 2011-08-02 | Discharge: 2011-08-02 | Disposition: A | Discharge status: Home / Self Care | Payer: Medicaid Other | Source: Ambulatory Visit | Attending: Thoracic Surgery (Cardiothoracic Vascular Surgery) | Admitting: Thoracic Surgery (Cardiothoracic Vascular Surgery)

## 2011-08-02 ENCOUNTER — Encounter (HOSPITAL_COMMUNITY): Payer: Self-pay

## 2011-08-02 ENCOUNTER — Encounter: Payer: Self-pay | Admitting: Pulmonary Disease

## 2011-08-02 ENCOUNTER — Encounter (HOSPITAL_COMMUNITY)
Admission: RE | Admit: 2011-08-02 | Discharge: 2011-08-02 | Disposition: A | Discharge status: Home / Self Care | Payer: Medicaid Other | Source: Ambulatory Visit | Attending: Thoracic Surgery (Cardiothoracic Vascular Surgery) | Admitting: Thoracic Surgery (Cardiothoracic Vascular Surgery)

## 2011-08-02 VITALS — BP 91/72 | HR 71 | Temp 97.0°F | Resp 20 | Ht 65.0 in

## 2011-08-02 DIAGNOSIS — I351 Nonrheumatic aortic (valve) insufficiency: Secondary | ICD-10-CM

## 2011-08-02 DIAGNOSIS — Z01810 Encounter for preprocedural cardiovascular examination: Secondary | ICD-10-CM

## 2011-08-02 DIAGNOSIS — I059 Rheumatic mitral valve disease, unspecified: Secondary | ICD-10-CM

## 2011-08-02 DIAGNOSIS — I350 Nonrheumatic aortic (valve) stenosis: Secondary | ICD-10-CM

## 2011-08-02 DIAGNOSIS — I080 Rheumatic disorders of both mitral and aortic valves: Secondary | ICD-10-CM | POA: Insufficient documentation

## 2011-08-02 DIAGNOSIS — I079 Rheumatic tricuspid valve disease, unspecified: Secondary | ICD-10-CM

## 2011-08-02 DIAGNOSIS — Z01812 Encounter for preprocedural laboratory examination: Secondary | ICD-10-CM | POA: Insufficient documentation

## 2011-08-02 DIAGNOSIS — I359 Nonrheumatic aortic valve disorder, unspecified: Secondary | ICD-10-CM

## 2011-08-02 DIAGNOSIS — Z01818 Encounter for other preprocedural examination: Secondary | ICD-10-CM | POA: Insufficient documentation

## 2011-08-02 HISTORY — DX: Ventricular tachycardia: I47.2

## 2011-08-02 HISTORY — DX: Anxiety disorder, unspecified: F41.9

## 2011-08-02 LAB — COMPREHENSIVE METABOLIC PANEL
ALT: 11 U/L (ref 0–35)
Albumin: 3.7 g/dL (ref 3.5–5.2)
Alkaline Phosphatase: 73 U/L (ref 39–117)
BUN: 17 mg/dL (ref 6–23)
Chloride: 103 mEq/L (ref 96–112)
GFR calc Af Amer: >90 mL/min (ref >90)
Glucose, Bld: 108 mg/dL — ABNORMAL HIGH (ref 70–99)
Potassium: 3.2 mEq/L — ABNORMAL LOW (ref 3.5–5.1)
Sodium: 141 mEq/L (ref 135–145)
Total Bilirubin: 0.5 mg/dL (ref 0.3–1.2)

## 2011-08-02 LAB — URINE MICROSCOPIC-ADD ON

## 2011-08-02 LAB — BLOOD GAS, ARTERIAL
Bicarbonate: 24.9 mEq/L — ABNORMAL HIGH (ref 20.0–24.0)
TCO2: 26.1 mmol/L (ref 0–100)
pCO2 arterial: 38.2 mmHg (ref 35.0–45.0)
pH, Arterial: 7.431 — ABNORMAL HIGH (ref 7.350–7.400)
pO2, Arterial: 85.5 mmHg (ref 80.0–100.0)

## 2011-08-02 LAB — URINALYSIS, ROUTINE W REFLEX MICROSCOPIC
Glucose, UA: NEGATIVE mg/dL
Ketones, ur: NEGATIVE mg/dL
Protein, ur: NEGATIVE mg/dL

## 2011-08-02 LAB — HCG, SERUM, QUALITATIVE: Preg, Serum: NEGATIVE

## 2011-08-02 LAB — SURGICAL PCR SCREEN: MRSA, PCR: NEGATIVE

## 2011-08-02 LAB — CBC
HCT: 35.9 % — ABNORMAL LOW (ref 36.0–46.0)
Hemoglobin: 12 g/dL (ref 12.0–15.0)
WBC: 5.2 10*3/uL (ref 4.0–10.5)

## 2011-08-02 LAB — PROTIME-INR: Prothrombin Time: 14.8 seconds (ref 11.6–15.2)

## 2011-08-02 NOTE — Consult Note (Addendum)
Anesthesia:  Patient is a 51 year old female scheduled for a AV Replacement, MV repair, possible TV repair on 08/04/11 for severe AS, moderate MR/TR.  History includes non-ischemic CM, CHF, VT.  She is currently wearing a Technical sales engineer.  Also with HTN, emphysema, COPD, former smoker, fibroids, anxiety, and polysubstance abuse including ETOH, marijuana and cocaine.  Her last positive drug screen was in February.  She could not give me a specific date of when she last used illicit drugs, but says it was "several weeks" ago.  She does do 2-3 shots of liquor most nights.    Her Cardiologist is Dr. Jens Som.  According to his last note on 07/05/11, she is to continue the life vest post valve surgery with plans to repeat her echo in 8-12 weeks and consider AICD at that time if her EF has not improved.  She reports her life vest has discharged once on 07/05/11.  She has manually aborted at least two other shocks, last on 07/27/11.  I advised her against this unless she had specific instructions from Dr. Jens Som.    Stress echo on 06/25/11 showed: - Left ventricle: LV is severely dilated with moderate concentric LV hypertrophy. Global hypokinesis, EF 25-30%. - Aortic valve: The valve appears to dome. There is aortic insufficiency that is not fully imaged by this study. - Staged echo: 1. Baseline: AVA 0.97 cm^2, peak/mean gradient 48/33 mmHg, SV 83 mL 2. 5 mcg/kg/min: AVA 0.86 cm^2, peak/mean gradient 53/33 mmHg, SV 76 mL 3. 10 mcg/kg/min: AVA 0.83 cm^2, peak/mean gradient 50/37 mmHg, SV 70 mL 4. 20 mcg/kg/min: AVA 0.77 cm^2, peak/mean gradient 66/39 mmHg, SV 75 mL Impressions:  - The mean gradient across the aortic valve increases with dobutamine infusion with decrease in calculated valve area. This suggests the presence of low gradient severe aortic stenosis. Contractile reserve was not present (stroke volume did not augment). Of note, the LV outflow tract is narrow and the aortic valve appears to  dome. There was aortic insufficiency present that was not fully visualized  TEE on 06/08/11 showed: - Left ventricle: Systolic function was severely reduced. The estimated ejection fraction was in the range of 25% to 30%. Diffuse hypokinesis with severe apical hypokinesis. - Aortic valve: There was moderate stenosis. Moderate to severe regurgitation. - Mitral valve: Moderate regurgitation. - Left atrium: The atrium was moderately dilated. - Atrial septum: No defect or patent foramen ovale was identified. - Tricuspid valve: Moderate regurgitation. - Pericardium, extracardiac: A small pericardial effusion was identified.  Cardiac cath on 06/07/11 showed: 1. Moderate 2 vessel coronary disease (30% mid LAD, 70% mid-prox D1, LCx 50-60%, 20% RCA).  2. Severe left ventricular dysfunction.  3. Moderate aortic stenosis with moderate to severe aortic insufficiency.  4. Mild pulmonary hypertension.  5. Normal left ventricular filling pressures indicate adequate diuresis.  6. EF 20%.  EKG from 08/02/11 showed NSR, possible LAE, LAD, low voltage QRS, possible inferior infarct, lateral T wave abnormality, consider ischemia.  She was evaluated by Pulmonologist Dr. Craige Cotta on 07/08/11.  According to his notes, PFT on 06/07/11 showed PFT>>FEV1 1.29(53%), FEV1% 66, TLC 4.72(90%), DLCO 59%, borderline BD response.  She was noted to have severe airflow obstruction and bronchodilator responsiveness on PFT.  She denied continued smoking or illicit drug use, so only continued PRN albuterol was recommended.  CXR on 08/02/11 showed: Enlargement of cardiac silhouette with pulmonary vascular  congestion. Prominent central pulmonary arteries consistent with pulmonary arterial hypertension. Hyperinflated lungs without acute infiltrate.   Labs  noted.  Her serum drug screen is still pending.  UA showed large leukocytes with trichomonas present.  I called this result to Jolene Carter,RN at [facility].  Anesthesiologist Dr.  Michelle Piper updated on history.

## 2011-08-02 NOTE — Pre-Procedure Instructions (Addendum)
20 Arvetta Araque  08/02/2011   Your procedure is scheduled on:  Wed, April 17   Report to [facility] Stay [facility] at 0630 AM.  Call this number if you have problems the morning of surgery: [phone]   Remember:   Do not eat food:After Midnight.  May have clear liquids: up to 4 Hours before arrival.(until 2:30 am)  Clear liquids include soda, tea, black coffee, apple or grape juice, broth,water  Take these medicines the morning of surgery with A SIP OF WATER: Carvedilol and Zyrtec   STOP Aspirin today  Do not wear jewelry, make-up or nail polish.  Do not wear lotions, powders, or perfumes.   Do not shave 48 hours prior to surgery.  Do not bring valuables to the hospital.  Contacts, dentures or bridgework may not be worn into surgery.  Leave suitcase in the car. After surgery it may be brought to your room.  For patients admitted to the hospital, checkout time is 11:00 AM the day of discharge.   Special Instructions: Incentive Spirometry - Practice and bring it with you on the day of surgery. and CHG Shower Use Special Wash: 1/2 bottle night before surgery and 1/2 bottle morning of surgery.   Please read over the following fact sheets that you were given: Pain Booklet, Coughing and Deep Breathing, Blood Transfusion Information, Open Heart Packet, MRSA Information and Surgical Site Infection Prevention

## 2011-08-02 NOTE — Progress Notes (Signed)
Spoke with Revonda Standard, [place] regarding history and that pt wears a life vest

## 2011-08-02 NOTE — Progress Notes (Signed)
Pt had PFT's done at [facility] in March studies printed and placed in chart. Pt has been for doppler study before PAT

## 2011-08-02 NOTE — Progress Notes (Signed)
Pre-op Cardiac Surgery  Carotid Findings:  No ICA stenosis.  Vertebral artery flow is antegrade.  Upper Extremity Right Left  Brachial Pressures 117T 121T  Radial Waveforms T T  Ulnar Waveforms T T  Palmar Arch (Allen's Test) WNL Doppler signal reverses with radial compression and remains normal with ulnar compression.   Findings:      Lower  Extremity Right Left  Dorsalis Pedis    Anterior Tibial    Posterior Tibial    Ankle/Brachial Indices      Findings:

## 2011-08-03 ENCOUNTER — Encounter (HOSPITAL_COMMUNITY): Payer: Self-pay | Admitting: Vascular Surgery

## 2011-08-03 LAB — HEMOGLOBIN A1C: Mean Plasma Glucose: 114 mg/dL (ref <117)

## 2011-08-03 MED ORDER — PHENYLEPHRINE HCL 10 MG/ML IJ SOLN
30.0000 ug/min | INTRAVENOUS | Status: DC
  Filled 2011-08-03: qty 2

## 2011-08-03 MED ORDER — MAGNESIUM SULFATE 50 % IJ SOLN
40.0000 meq | INTRAMUSCULAR | Status: DC
  Filled 2011-08-03: qty 10

## 2011-08-03 MED ORDER — VANCOMYCIN HCL 1000 MG IV SOLR
1250.0000 mg | INTRAVENOUS | Status: AC
  Administered 2011-08-04: 1250 mg via INTRAVENOUS
  Filled 2011-08-03: qty 1250

## 2011-08-03 MED ORDER — TRANEXAMIC ACID (OHS) BOLUS VIA INFUSION
15.0000 mg/kg | INTRAVENOUS | Status: AC
  Administered 2011-08-04: 1164 mg via INTRAVENOUS
  Filled 2011-08-03: qty 1164

## 2011-08-03 MED ORDER — POTASSIUM CHLORIDE 2 MEQ/ML IV SOLN
80.0000 meq | INTRAVENOUS | Status: DC
  Filled 2011-08-03: qty 40

## 2011-08-03 MED ORDER — EPINEPHRINE HCL 1 MG/ML IJ SOLN
0.5000 ug/min | INTRAVENOUS | Status: DC
  Filled 2011-08-03: qty 4

## 2011-08-03 MED ORDER — SODIUM BICARBONATE 8.4 % IV SOLN
INTRAVENOUS | Status: AC
  Administered 2011-08-04: 10:00:00
  Filled 2011-08-03: qty 2.5

## 2011-08-03 MED ORDER — CHLORHEXIDINE GLUCONATE 4 % EX LIQD: 30.0000 mL | CUTANEOUS | Status: DC

## 2011-08-03 MED ORDER — TRANEXAMIC ACID (OHS) PUMP PRIME SOLUTION
2.0000 mg/kg | INTRAVENOUS | Status: DC
  Filled 2011-08-03: qty 1.55

## 2011-08-03 MED ORDER — DOPAMINE-DEXTROSE 3.2-5 MG/ML-% IV SOLN
2.0000 ug/kg/min | INTRAVENOUS | Status: DC
  Filled 2011-08-03: qty 250

## 2011-08-03 MED ORDER — MOXIFLOXACIN HCL IN NACL 400 MG/250ML IV SOLN
400.0000 mg | INTRAVENOUS | Status: AC
  Administered 2011-08-04: 400 mg via INTRAVENOUS
  Filled 2011-08-03: qty 250

## 2011-08-03 MED ORDER — SODIUM CHLORIDE 0.9 % IV SOLN
INTRAVENOUS | Status: AC
  Administered 2011-08-04: 2.2 [IU]/h via INTRAVENOUS
  Filled 2011-08-03: qty 1

## 2011-08-03 MED ORDER — SODIUM CHLORIDE 0.9 % IV SOLN
0.1000 ug/kg/h | INTRAVENOUS | Status: DC
  Filled 2011-08-03: qty 4

## 2011-08-03 MED ORDER — NITROGLYCERIN IN D5W 200-5 MCG/ML-% IV SOLN
2.0000 ug/min | INTRAVENOUS | Status: DC
  Filled 2011-08-03: qty 250

## 2011-08-03 MED ORDER — METOPROLOL TARTRATE 12.5 MG HALF TABLET: 12.5000 mg | ORAL_TABLET | Freq: Once | ORAL | Status: DC

## 2011-08-03 MED ORDER — TRANEXAMIC ACID 100 MG/ML IV SOLN
1.5000 mg/kg/h | INTRAVENOUS | Status: AC
  Administered 2011-08-04: 1.5 mg/kg/h via INTRAVENOUS
  Filled 2011-08-03: qty 25

## 2011-08-03 NOTE — Progress Notes (Signed)
Blood sample [facility]... Spoke with Diamond Nickel there ... They then sent [facility] and the turn around time is 2 days.. For this particular  Test ... Expect results on the 18th  Of this month.

## 2011-08-04 ENCOUNTER — Inpatient Hospital Stay (HOSPITAL_COMMUNITY)
Admission: RE | Admit: 2011-08-04 | Discharge: 2011-08-13 | DRG: 220 | Disposition: A | Discharge status: Home / Self Care | Payer: Medicaid Other | Source: Ambulatory Visit | Attending: Thoracic Surgery (Cardiothoracic Vascular Surgery) | Admitting: Thoracic Surgery (Cardiothoracic Vascular Surgery)

## 2011-08-04 ENCOUNTER — Inpatient Hospital Stay (HOSPITAL_COMMUNITY): Payer: Medicaid Other

## 2011-08-04 ENCOUNTER — Ambulatory Visit (HOSPITAL_COMMUNITY): Payer: Medicaid Other | Admitting: Vascular Surgery

## 2011-08-04 ENCOUNTER — Encounter (HOSPITAL_COMMUNITY): Payer: Self-pay | Admitting: Vascular Surgery

## 2011-08-04 ENCOUNTER — Encounter (HOSPITAL_COMMUNITY)
Admission: RE | Disposition: A | Discharge status: Home / Self Care | Payer: Self-pay | Source: Ambulatory Visit | Attending: Thoracic Surgery (Cardiothoracic Vascular Surgery)

## 2011-08-04 DIAGNOSIS — J4489 Other specified chronic obstructive pulmonary disease: Secondary | ICD-10-CM | POA: Diagnosis present

## 2011-08-04 DIAGNOSIS — K560 Paralytic ileus: Secondary | ICD-10-CM | POA: Diagnosis not present

## 2011-08-04 DIAGNOSIS — I079 Rheumatic tricuspid valve disease, unspecified: Secondary | ICD-10-CM | POA: Diagnosis present

## 2011-08-04 DIAGNOSIS — Z952 Presence of prosthetic heart valve: Secondary | ICD-10-CM

## 2011-08-04 DIAGNOSIS — Z880 Allergy status to penicillin: Secondary | ICD-10-CM

## 2011-08-04 DIAGNOSIS — E8770 Fluid overload, unspecified: Secondary | ICD-10-CM

## 2011-08-04 DIAGNOSIS — I059 Rheumatic mitral valve disease, unspecified: Secondary | ICD-10-CM

## 2011-08-04 DIAGNOSIS — Z9889 Other specified postprocedural states: Secondary | ICD-10-CM

## 2011-08-04 DIAGNOSIS — I509 Heart failure, unspecified: Secondary | ICD-10-CM | POA: Diagnosis present

## 2011-08-04 DIAGNOSIS — F1010 Alcohol abuse, uncomplicated: Secondary | ICD-10-CM | POA: Diagnosis present

## 2011-08-04 DIAGNOSIS — I428 Other cardiomyopathies: Secondary | ICD-10-CM

## 2011-08-04 DIAGNOSIS — I080 Rheumatic disorders of both mitral and aortic valves: Principal | ICD-10-CM | POA: Diagnosis present

## 2011-08-04 DIAGNOSIS — Z87891 Personal history of nicotine dependence: Secondary | ICD-10-CM

## 2011-08-04 DIAGNOSIS — K929 Disease of digestive system, unspecified: Secondary | ICD-10-CM | POA: Diagnosis not present

## 2011-08-04 DIAGNOSIS — F1210 Cannabis abuse, uncomplicated: Secondary | ICD-10-CM | POA: Diagnosis present

## 2011-08-04 DIAGNOSIS — I7789 Other specified disorders of arteries and arterioles: Secondary | ICD-10-CM

## 2011-08-04 DIAGNOSIS — Z9119 Patient's noncompliance with other medical treatment and regimen: Secondary | ICD-10-CM

## 2011-08-04 DIAGNOSIS — I2510 Atherosclerotic heart disease of native coronary artery without angina pectoris: Secondary | ICD-10-CM | POA: Diagnosis present

## 2011-08-04 DIAGNOSIS — Y9210 Unspecified residential institution as the place of occurrence of the external cause: Secondary | ICD-10-CM | POA: Diagnosis not present

## 2011-08-04 DIAGNOSIS — Z79899 Other long term (current) drug therapy: Secondary | ICD-10-CM

## 2011-08-04 DIAGNOSIS — D62 Acute posthemorrhagic anemia: Secondary | ICD-10-CM | POA: Diagnosis not present

## 2011-08-04 DIAGNOSIS — Z7982 Long term (current) use of aspirin: Secondary | ICD-10-CM

## 2011-08-04 DIAGNOSIS — Y831 Surgical operation with implant of artificial internal device as the cause of abnormal reaction of the patient, or of later complication, without mention of misadventure at the time of the procedure: Secondary | ICD-10-CM | POA: Diagnosis not present

## 2011-08-04 DIAGNOSIS — I359 Nonrheumatic aortic valve disorder, unspecified: Secondary | ICD-10-CM

## 2011-08-04 DIAGNOSIS — D696 Thrombocytopenia, unspecified: Secondary | ICD-10-CM | POA: Diagnosis not present

## 2011-08-04 DIAGNOSIS — F1410 Cocaine abuse, uncomplicated: Secondary | ICD-10-CM | POA: Diagnosis present

## 2011-08-04 DIAGNOSIS — I10 Essential (primary) hypertension: Secondary | ICD-10-CM | POA: Diagnosis present

## 2011-08-04 DIAGNOSIS — I2789 Other specified pulmonary heart diseases: Secondary | ICD-10-CM | POA: Diagnosis present

## 2011-08-04 DIAGNOSIS — J449 Chronic obstructive pulmonary disease, unspecified: Secondary | ICD-10-CM | POA: Diagnosis present

## 2011-08-04 DIAGNOSIS — Z91199 Patient's noncompliance with other medical treatment and regimen due to unspecified reason: Secondary | ICD-10-CM

## 2011-08-04 DIAGNOSIS — I959 Hypotension, unspecified: Secondary | ICD-10-CM | POA: Diagnosis not present

## 2011-08-04 HISTORY — PX: AORTIC VALVE REPLACEMENT: SHX41

## 2011-08-04 HISTORY — PX: MITRAL VALVE REPAIR: SHX2039

## 2011-08-04 LAB — POCT I-STAT 3, ART BLOOD GAS (G3+)
Acid-Base Excess: 1 mmol/L (ref 0.0–2.0)
Acid-base deficit: 2 mmol/L (ref 0.0–2.0)
Acid-base deficit: 4 mmol/L — ABNORMAL HIGH (ref 0.0–2.0)
Acid-base deficit: 2 mmol/L (ref 0.0–2.0)
Acid-base deficit: 4 mmol/L — ABNORMAL HIGH (ref 0.0–2.0)
Bicarbonate: 25.7 mEq/L — ABNORMAL HIGH (ref 20.0–24.0)
Bicarbonate: 22.8 mEq/L (ref 20.0–24.0)
Bicarbonate: 23.1 mEq/L (ref 20.0–24.0)
Bicarbonate: 23.9 mEq/L (ref 20.0–24.0)
Bicarbonate: 21.7 mEq/L (ref 20.0–24.0)
O2 Saturation: 100 %
O2 Saturation: 100 %
O2 Saturation: 94 %
O2 Saturation: 89 %
Patient temperature: 36.9
Patient temperature: 37.6
TCO2: 27 mmol/L (ref 0–100)
TCO2: 24 mmol/L (ref 0–100)
TCO2: 25 mmol/L (ref 0–100)
TCO2: 24 mmol/L (ref 0–100)
pCO2 arterial: 25.7 mmHg — ABNORMAL LOW (ref 35.0–45.0)
pCO2 arterial: 43.5 mmHg (ref 35.0–45.0)
pH, Arterial: 7.632 (ref 7.350–7.400)
pH, Arterial: 7.302 — ABNORMAL LOW (ref 7.350–7.400)
pO2, Arterial: 377 mmHg — ABNORMAL HIGH (ref 80.0–100.0)
pO2, Arterial: 407 mmHg — ABNORMAL HIGH (ref 80.0–100.0)
pO2, Arterial: 317 mmHg — ABNORMAL HIGH (ref 80.0–100.0)
pO2, Arterial: 75 mmHg — ABNORMAL LOW (ref 80.0–100.0)
pO2, Arterial: 65 mmHg — ABNORMAL LOW (ref 80.0–100.0)

## 2011-08-04 LAB — CBC
HCT: 35.5 % — ABNORMAL LOW (ref 36.0–46.0)
Hemoglobin: 12.7 g/dL (ref 12.0–15.0)
Hemoglobin: 12.1 g/dL (ref 12.0–15.0)
MCH: 31.5 pg (ref 26.0–34.0)
MCHC: 34.5 g/dL (ref 30.0–36.0)
MCV: 92.4 fL (ref 78.0–100.0)
Platelets: 84 10*3/uL — ABNORMAL LOW (ref 150–400)
RBC: 3.84 MIL/uL — ABNORMAL LOW (ref 3.87–5.11)
RDW: 15.7 % — ABNORMAL HIGH (ref 11.5–15.5)
WBC: 10.1 10*3/uL (ref 4.0–10.5)

## 2011-08-04 LAB — POCT I-STAT 4, (NA,K, GLUC, HGB,HCT)
Glucose, Bld: 89 mg/dL (ref 70–99)
Glucose, Bld: 96 mg/dL (ref 70–99)
Glucose, Bld: 177 mg/dL — ABNORMAL HIGH (ref 70–99)
Glucose, Bld: 103 mg/dL — ABNORMAL HIGH (ref 70–99)
HCT: 33 % — ABNORMAL LOW (ref 36.0–46.0)
HCT: 23 % — ABNORMAL LOW (ref 36.0–46.0)
HCT: 22 % — ABNORMAL LOW (ref 36.0–46.0)
Hemoglobin: 11.2 g/dL — ABNORMAL LOW (ref 12.0–15.0)
Hemoglobin: 8.8 g/dL — ABNORMAL LOW (ref 12.0–15.0)
Hemoglobin: 7.5 g/dL — ABNORMAL LOW (ref 12.0–15.0)
Hemoglobin: 12.6 g/dL (ref 12.0–15.0)
Potassium: 2.8 mEq/L — ABNORMAL LOW (ref 3.5–5.1)
Potassium: 2.7 mEq/L — CL (ref 3.5–5.1)
Potassium: 4.2 mEq/L (ref 3.5–5.1)
Potassium: 6.4 mEq/L (ref 3.5–5.1)
Potassium: 5.1 mEq/L (ref 3.5–5.1)
Potassium: 3.8 mEq/L (ref 3.5–5.1)
Potassium: 3.4 mEq/L — ABNORMAL LOW (ref 3.5–5.1)
Sodium: 142 mEq/L (ref 135–145)
Sodium: 140 mEq/L (ref 135–145)
Sodium: 142 mEq/L (ref 135–145)
Sodium: 143 mEq/L (ref 135–145)

## 2011-08-04 LAB — POCT I-STAT, CHEM 8
Chloride: 106 mEq/L (ref 96–112)
Creatinine, Ser: 0.8 mg/dL (ref 0.50–1.10)
Glucose, Bld: 152 mg/dL — ABNORMAL HIGH (ref 70–99)
Hemoglobin: 12.6 g/dL (ref 12.0–15.0)
Potassium: 3.4 mEq/L — ABNORMAL LOW (ref 3.5–5.1)
Sodium: 143 mEq/L (ref 135–145)

## 2011-08-04 LAB — GLUCOSE, CAPILLARY
Glucose-Capillary: 140 mg/dL — ABNORMAL HIGH (ref 70–99)
Glucose-Capillary: 66 mg/dL — ABNORMAL LOW (ref 70–99)
Glucose-Capillary: 79 mg/dL (ref 70–99)

## 2011-08-04 LAB — RAPID URINE DRUG SCREEN, HOSP PERFORMED
Amphetamines: NOT DETECTED
Barbiturates: NOT DETECTED
Cocaine: NOT DETECTED
Tetrahydrocannabinol: NOT DETECTED

## 2011-08-04 LAB — HEMOGLOBIN AND HEMATOCRIT, BLOOD: Hemoglobin: 7.7 g/dL — ABNORMAL LOW (ref 12.0–15.0)

## 2011-08-04 LAB — PROTIME-INR
INR: 1.76 — ABNORMAL HIGH (ref 0.00–1.49)
Prothrombin Time: 20.8 seconds — ABNORMAL HIGH (ref 11.6–15.2)

## 2011-08-04 LAB — PLATELET COUNT: Platelets: 62 10*3/uL — ABNORMAL LOW (ref 150–400)

## 2011-08-04 LAB — CREATININE, SERUM: GFR calc Af Amer: >90 mL/min (ref >90)

## 2011-08-04 SURGERY — REPLACEMENT, AORTIC VALVE, OPEN
Anesthesia: General | Site: Chest | Wound class: Clean

## 2011-08-04 MED ORDER — GLYCOPYRROLATE 0.2 MG/ML IJ SOLN
INTRAMUSCULAR | Status: DC | PRN
  Administered 2011-08-04: 0.2 mg via INTRAVENOUS

## 2011-08-04 MED ORDER — ROCURONIUM BROMIDE 100 MG/10ML IV SOLN
INTRAVENOUS | Status: DC | PRN
  Administered 2011-08-04: 100 mg via INTRAVENOUS

## 2011-08-04 MED ORDER — SODIUM CHLORIDE 0.9 % IV SOLN
INTRAVENOUS | Status: DC
  Administered 2011-08-04 (×2): 20 mL via INTRAVENOUS

## 2011-08-04 MED ORDER — SODIUM CHLORIDE 0.9 % IJ SOLN
3.0000 mL | Freq: Two times a day (BID) | INTRAMUSCULAR | Status: DC
  Administered 2011-08-05 – 2011-08-08 (×5): 3 mL via INTRAVENOUS

## 2011-08-04 MED ORDER — SODIUM CHLORIDE 0.9 % IV SOLN
200.0000 ug | INTRAVENOUS | Status: DC | PRN
  Administered 2011-08-04: 0.2 ug/kg/h via INTRAVENOUS

## 2011-08-04 MED ORDER — 0.9 % SODIUM CHLORIDE (POUR BTL) OPTIME
TOPICAL | Status: DC | PRN
  Administered 2011-08-04: 1000 mL

## 2011-08-04 MED ORDER — SODIUM CHLORIDE 0.9 % IV SOLN
INTRAVENOUS | Status: DC | PRN
  Administered 2011-08-04: 16:00:00 via INTRAVENOUS

## 2011-08-04 MED ORDER — HEMOSTATIC AGENTS (NO CHARGE) OPTIME
TOPICAL | Status: DC | PRN
  Administered 2011-08-04: 1 via TOPICAL

## 2011-08-04 MED ORDER — OXYCODONE HCL 5 MG PO TABS
5.0000 mg | ORAL_TABLET | ORAL | Status: DC | PRN
  Administered 2011-08-04: 10 mg via ORAL
  Administered 2011-08-05: 5 mg via ORAL
  Administered 2011-08-05: 10 mg via ORAL
  Administered 2011-08-05 (×3): 5 mg via ORAL
  Administered 2011-08-05: 10 mg via ORAL
  Administered 2011-08-06 (×2): 5 mg via ORAL
  Administered 2011-08-06: 10 mg via ORAL
  Administered 2011-08-06: 5 mg via ORAL
  Administered 2011-08-07 – 2011-08-08 (×8): 10 mg via ORAL
  Filled 2011-08-04 (×2): qty 2
  Filled 2011-08-04: qty 1
  Filled 2011-08-04 (×3): qty 2
  Filled 2011-08-04: qty 1
  Filled 2011-08-04: qty 2
  Filled 2011-08-04: qty 1
  Filled 2011-08-04: qty 2
  Filled 2011-08-04 (×2): qty 1
  Filled 2011-08-04 (×4): qty 2
  Filled 2011-08-04 (×2): qty 1
  Filled 2011-08-04 (×2): qty 2

## 2011-08-04 MED ORDER — POTASSIUM CHLORIDE 10 MEQ/50ML IV SOLN
10.0000 meq | INTRAVENOUS | Status: AC | PRN
  Administered 2011-08-04 – 2011-08-05 (×3): 10 meq via INTRAVENOUS
  Filled 2011-08-04: qty 50

## 2011-08-04 MED ORDER — ACETAMINOPHEN 160 MG/5ML PO SOLN: 650.0000 mg | ORAL | Status: AC

## 2011-08-04 MED ORDER — SODIUM CHLORIDE 0.9 % IJ SOLN
INTRAMUSCULAR | Status: DC | PRN
  Administered 2011-08-04 (×4): via TOPICAL

## 2011-08-04 MED ORDER — ONDANSETRON HCL 4 MG/2ML IJ SOLN
4.0000 mg | Freq: Four times a day (QID) | INTRAMUSCULAR | Status: DC | PRN
  Administered 2011-08-05 – 2011-08-07 (×3): 4 mg via INTRAVENOUS
  Filled 2011-08-04 (×3): qty 2

## 2011-08-04 MED ORDER — ALBUTEROL SULFATE (5 MG/ML) 0.5% IN NEBU
2.5000 mg | INHALATION_SOLUTION | Freq: Four times a day (QID) | RESPIRATORY_TRACT | Status: DC
  Administered 2011-08-04 – 2011-08-07 (×9): 2.5 mg via RESPIRATORY_TRACT
  Filled 2011-08-04 (×11): qty 0.5

## 2011-08-04 MED ORDER — VECURONIUM BROMIDE 10 MG IV SOLR
INTRAVENOUS | Status: DC | PRN
  Administered 2011-08-04 (×2): 5 mg via INTRAVENOUS

## 2011-08-04 MED ORDER — LACTATED RINGERS IV SOLN
INTRAVENOUS | Status: DC
  Administered 2011-08-04: 60 mL via INTRAVENOUS
  Administered 2011-08-05: 500 mL via INTRAVENOUS
  Administered 2011-08-06: 17:00:00 via INTRAVENOUS

## 2011-08-04 MED ORDER — BISACODYL 10 MG RE SUPP: 10.0000 mg | Freq: Every day | RECTAL | Status: DC

## 2011-08-04 MED ORDER — DOCUSATE SODIUM 100 MG PO CAPS
200.0000 mg | ORAL_CAPSULE | Freq: Every day | ORAL | Status: DC
  Administered 2011-08-05 – 2011-08-09 (×4): 200 mg via ORAL
  Filled 2011-08-04 (×4): qty 2

## 2011-08-04 MED ORDER — FENTANYL CITRATE 0.05 MG/ML IJ SOLN
INTRAMUSCULAR | Status: DC | PRN
  Administered 2011-08-04 (×3): 250 ug via INTRAVENOUS
  Administered 2011-08-04 (×2): 50 ug via INTRAVENOUS
  Administered 2011-08-04 (×2): 150 ug via INTRAVENOUS

## 2011-08-04 MED ORDER — LACTATED RINGERS IV SOLN
INTRAVENOUS | Status: DC | PRN
  Administered 2011-08-04: 08:00:00 via INTRAVENOUS

## 2011-08-04 MED ORDER — INSULIN REGULAR HUMAN 100 UNIT/ML IJ SOLN
INTRAMUSCULAR | Status: DC
  Administered 2011-08-04: 1.3 [IU]/h via INTRAVENOUS
  Filled 2011-08-04: qty 1

## 2011-08-04 MED ORDER — INSULIN ASPART 100 UNIT/ML ~~LOC~~ SOLN
0.0000 [IU] | SUBCUTANEOUS | Status: AC
  Administered 2011-08-04: 2 [IU] via SUBCUTANEOUS

## 2011-08-04 MED ORDER — MIDAZOLAM HCL 2 MG/2ML IJ SOLN: 2.0000 mg | INTRAMUSCULAR | Status: DC | PRN

## 2011-08-04 MED ORDER — ETOMIDATE 2 MG/ML IV SOLN
INTRAVENOUS | Status: DC | PRN
  Administered 2011-08-04: 20 mg via INTRAVENOUS

## 2011-08-04 MED ORDER — NITROGLYCERIN IN D5W 200-5 MCG/ML-% IV SOLN: 0.0000 ug/min | INTRAVENOUS | Status: DC

## 2011-08-04 MED ORDER — METOPROLOL TARTRATE 12.5 MG HALF TABLET
12.5000 mg | ORAL_TABLET | Freq: Two times a day (BID) | ORAL | Status: DC
  Administered 2011-08-06 – 2011-08-08 (×2): 12.5 mg via ORAL
  Filled 2011-08-04 (×9): qty 1

## 2011-08-04 MED ORDER — POTASSIUM CHLORIDE 10 MEQ/50ML IV SOLN
10.0000 meq | INTRAVENOUS | Status: AC
  Administered 2011-08-04 (×3): 10 meq via INTRAVENOUS

## 2011-08-04 MED ORDER — PHENYLEPHRINE HCL 10 MG/ML IJ SOLN
0.0000 ug/min | INTRAVENOUS | Status: DC
  Administered 2011-08-04: 50 ug/min via INTRAVENOUS
  Administered 2011-08-05 (×2): 100 ug/min via INTRAVENOUS
  Filled 2011-08-04 (×7): qty 2

## 2011-08-04 MED ORDER — MORPHINE SULFATE 4 MG/ML IJ SOLN
2.0000 mg | INTRAMUSCULAR | Status: DC | PRN
  Administered 2011-08-04 – 2011-08-05 (×4): 2 mg via INTRAVENOUS
  Administered 2011-08-08: 4 mg via INTRAVENOUS
  Filled 2011-08-04 (×3): qty 1

## 2011-08-04 MED ORDER — FAMOTIDINE IN NACL 20-0.9 MG/50ML-% IV SOLN
20.0000 mg | Freq: Two times a day (BID) | INTRAVENOUS | Status: AC
  Administered 2011-08-04: 20 mg via INTRAVENOUS

## 2011-08-04 MED ORDER — VANCOMYCIN HCL IN DEXTROSE 1-5 GM/200ML-% IV SOLN
1000.0000 mg | Freq: Once | INTRAVENOUS | Status: AC
  Administered 2011-08-04: 1000 mg via INTRAVENOUS
  Filled 2011-08-04: qty 200

## 2011-08-04 MED ORDER — SODIUM CHLORIDE 0.9 % IJ SOLN: 3.0000 mL | INTRAMUSCULAR | Status: DC | PRN

## 2011-08-04 MED ORDER — PROTAMINE SULFATE 10 MG/ML IV SOLN
INTRAVENOUS | Status: DC | PRN
  Administered 2011-08-04: 250 mg via INTRAVENOUS

## 2011-08-04 MED ORDER — SODIUM CHLORIDE 0.9 % IV SOLN
0.1000 ug/kg/h | INTRAVENOUS | Status: DC
  Administered 2011-08-04: 0.7 ug/kg/h via INTRAVENOUS
  Filled 2011-08-04 (×2): qty 2

## 2011-08-04 MED ORDER — SODIUM CHLORIDE 0.9 % IV SOLN
10.0000 mg | INTRAVENOUS | Status: DC | PRN
  Administered 2011-08-04: 40 ug/min via INTRAVENOUS

## 2011-08-04 MED ORDER — HEPARIN SODIUM (PORCINE) 1000 UNIT/ML IJ SOLN
INTRAMUSCULAR | Status: DC | PRN
  Administered 2011-08-04: 25000 [IU] via INTRAVENOUS

## 2011-08-04 MED ORDER — LACTATED RINGERS IV SOLN
INTRAVENOUS | Status: DC | PRN
  Administered 2011-08-04 (×3): via INTRAVENOUS

## 2011-08-04 MED ORDER — POTASSIUM CHLORIDE 10 MEQ/50ML IV SOLN
10.0000 meq | Freq: Once | INTRAVENOUS | Status: AC
  Administered 2011-08-04: 10 meq via INTRAVENOUS

## 2011-08-04 MED ORDER — MILRINONE IN DEXTROSE 200-5 MCG/ML-% IV SOLN
INTRAVENOUS | Status: DC | PRN
  Administered 2011-08-04: 0.375 ug/kg/min via INTRAVENOUS

## 2011-08-04 MED ORDER — DOPAMINE-DEXTROSE 3.2-5 MG/ML-% IV SOLN
INTRAVENOUS | Status: DC | PRN
  Administered 2011-08-04: 3 ug/kg/min via INTRAVENOUS

## 2011-08-04 MED ORDER — DOPAMINE-DEXTROSE 3.2-5 MG/ML-% IV SOLN
5.0000 ug/kg/min | INTRAVENOUS | Status: DC
  Administered 2011-08-04: 5 ug/kg/min via INTRAVENOUS
  Administered 2011-08-06: 3 ug/kg/min via INTRAVENOUS
  Administered 2011-08-06: 5 ug/kg/min via INTRAVENOUS
  Filled 2011-08-04: qty 250

## 2011-08-04 MED ORDER — ALBUMIN HUMAN 5 % IV SOLN
250.0000 mL | INTRAVENOUS | Status: AC | PRN
  Administered 2011-08-05 (×4): 250 mL via INTRAVENOUS
  Filled 2011-08-04 (×3): qty 250

## 2011-08-04 MED ORDER — ASPIRIN 81 MG PO CHEW: 324.0000 mg | CHEWABLE_TABLET | Freq: Every day | ORAL | Status: DC

## 2011-08-04 MED ORDER — 0.9 % SODIUM CHLORIDE (POUR BTL) OPTIME
TOPICAL | Status: DC | PRN
  Administered 2011-08-04: 6000 mL

## 2011-08-04 MED ORDER — PANTOPRAZOLE SODIUM 40 MG PO TBEC
40.0000 mg | DELAYED_RELEASE_TABLET | Freq: Every day | ORAL | Status: DC
  Administered 2011-08-06 – 2011-08-13 (×8): 40 mg via ORAL
  Filled 2011-08-04 (×7): qty 1

## 2011-08-04 MED ORDER — SODIUM BICARBONATE 8.4 % IV SOLN
50.0000 meq | Freq: Once | INTRAVENOUS | Status: AC
  Administered 2011-08-04: 50 meq via INTRAVENOUS
  Filled 2011-08-04: qty 50

## 2011-08-04 MED ORDER — HEMOSTATIC AGENTS (NO CHARGE) OPTIME
TOPICAL | Status: DC | PRN
  Administered 2011-08-04 (×3): 1 via TOPICAL

## 2011-08-04 MED ORDER — SODIUM CHLORIDE 0.9 % IV SOLN: 250.0000 mL | INTRAVENOUS | Status: DC

## 2011-08-04 MED ORDER — DEXTROSE 50 % IV SOLN
INTRAVENOUS | Status: AC
  Administered 2011-08-04: 14 mL
  Filled 2011-08-04: qty 50

## 2011-08-04 MED ORDER — ALBUMIN HUMAN 5 % IV SOLN
INTRAVENOUS | Status: DC | PRN
  Administered 2011-08-04: 16:00:00 via INTRAVENOUS

## 2011-08-04 MED ORDER — MIDAZOLAM HCL 5 MG/5ML IJ SOLN
INTRAMUSCULAR | Status: DC | PRN
  Administered 2011-08-04 (×2): 2 mg via INTRAVENOUS
  Administered 2011-08-04: 4 mg via INTRAVENOUS
  Administered 2011-08-04 (×3): 2 mg via INTRAVENOUS

## 2011-08-04 MED ORDER — MILRINONE IN DEXTROSE 200-5 MCG/ML-% IV SOLN
0.3000 ug/kg/min | INTRAVENOUS | Status: DC
  Administered 2011-08-04 (×2): 0.3 ug/kg/min via INTRAVENOUS
  Filled 2011-08-04 (×2): qty 100

## 2011-08-04 MED ORDER — BISACODYL 5 MG PO TBEC
10.0000 mg | DELAYED_RELEASE_TABLET | Freq: Every day | ORAL | Status: DC
  Administered 2011-08-05 – 2011-08-09 (×4): 10 mg via ORAL
  Filled 2011-08-04 (×4): qty 2

## 2011-08-04 MED ORDER — MORPHINE SULFATE 2 MG/ML IJ SOLN
1.0000 mg | INTRAMUSCULAR | Status: AC | PRN
  Administered 2011-08-04: 2 mg via INTRAVENOUS
  Filled 2011-08-04 (×2): qty 1

## 2011-08-04 MED ORDER — LORATADINE 10 MG PO TABS
10.0000 mg | ORAL_TABLET | Freq: Every day | ORAL | Status: DC
  Administered 2011-08-05 – 2011-08-13 (×9): 10 mg via ORAL
  Filled 2011-08-04 (×9): qty 1

## 2011-08-04 MED ORDER — SODIUM CHLORIDE 0.45 % IV SOLN: INTRAVENOUS | Status: DC

## 2011-08-04 MED ORDER — METOPROLOL TARTRATE 25 MG/10 ML ORAL SUSPENSION
12.5000 mg | Freq: Two times a day (BID) | ORAL | Status: DC
  Filled 2011-08-04 (×9): qty 5

## 2011-08-04 MED ORDER — ACETAMINOPHEN 160 MG/5ML PO SOLN: 975.0000 mg | Freq: Four times a day (QID) | ORAL | Status: DC

## 2011-08-04 MED ORDER — MAGNESIUM SULFATE 40 MG/ML IJ SOLN
4.0000 g | Freq: Once | INTRAMUSCULAR | Status: AC
  Administered 2011-08-04: 4 g via INTRAVENOUS
  Filled 2011-08-04: qty 100

## 2011-08-04 MED ORDER — ACETAMINOPHEN 650 MG RE SUPP
650.0000 mg | RECTAL | Status: AC
  Administered 2011-08-04: 650 mg via RECTAL

## 2011-08-04 MED ORDER — LACTATED RINGERS IV SOLN: 500.0000 mL | Freq: Once | INTRAVENOUS | Status: AC | PRN

## 2011-08-04 MED ORDER — METOPROLOL TARTRATE 1 MG/ML IV SOLN: 2.5000 mg | INTRAVENOUS | Status: DC | PRN

## 2011-08-04 MED ORDER — INSULIN REGULAR BOLUS VIA INFUSION
0.0000 [IU] | Freq: Three times a day (TID) | INTRAVENOUS | Status: DC
  Filled 2011-08-04: qty 10

## 2011-08-04 MED ORDER — INSULIN ASPART 100 UNIT/ML ~~LOC~~ SOLN
0.0000 [IU] | SUBCUTANEOUS | Status: DC
  Administered 2011-08-05 (×3): 2 [IU] via SUBCUTANEOUS

## 2011-08-04 MED ORDER — PHENYLEPHRINE HCL 10 MG/ML IJ SOLN
20.0000 mg | INTRAVENOUS | Status: DC | PRN
  Administered 2011-08-04: 15 ug/min via INTRAVENOUS

## 2011-08-04 MED ORDER — ACETAMINOPHEN 500 MG PO TABS
1000.0000 mg | ORAL_TABLET | Freq: Four times a day (QID) | ORAL | Status: DC
  Administered 2011-08-05 – 2011-08-08 (×14): 1000 mg via ORAL
  Filled 2011-08-04 (×18): qty 2

## 2011-08-04 MED ORDER — SIMVASTATIN 20 MG PO TABS
20.0000 mg | ORAL_TABLET | Freq: Every day | ORAL | Status: DC
  Administered 2011-08-05 – 2011-08-12 (×8): 20 mg via ORAL
  Filled 2011-08-04 (×11): qty 1

## 2011-08-04 MED ORDER — ASPIRIN EC 325 MG PO TBEC
325.0000 mg | DELAYED_RELEASE_TABLET | Freq: Every day | ORAL | Status: DC
  Administered 2011-08-05 – 2011-08-13 (×9): 325 mg via ORAL
  Filled 2011-08-04 (×9): qty 1

## 2011-08-04 SURGICAL SUPPLY — 117 items
ADAPTER CARDIO PERF ANTE/RETRO (ADAPTER) ×3 IMPLANT
ADH SRG 12 PREFL SYR 3 SPRDR (MISCELLANEOUS)
ADPR PRFSN 84XANTGRD RTRGD (ADAPTER) ×2
APPLICATOR COTTON TIP 6IN STRL (MISCELLANEOUS) IMPLANT
ATTRACTOMAT 16X20 MAGNETIC DRP (DRAPES) ×2 IMPLANT
BAG DECANTER FOR FLEXI CONT (MISCELLANEOUS) ×2 IMPLANT
BLADE STERNUM SYSTEM 6 (BLADE) ×2 IMPLANT
BLADE SURG 15 STRL LF DISP TIS (BLADE) ×1 IMPLANT
BLADE SURG 15 STRL SS (BLADE) ×2
BLANKET WARM CARDIAC ADLT BAIR (MISCELLANEOUS) ×1 IMPLANT
CANISTER SUCTION 2500CC (MISCELLANEOUS) ×2 IMPLANT
CANNULA ARTERIAL NVNT 3/8 22FR (MISCELLANEOUS) IMPLANT
CANNULA GUNDRY RCSP 15FR (MISCELLANEOUS) ×2 IMPLANT
CANNULA RT ANGLE VENOUS 36FR (CANNULA) IMPLANT
CANNULA VEN 1 STAGE STR 66236 (MISCELLANEOUS) IMPLANT
CANNULA VRC MALB SNGL STG 30FR (MISCELLANEOUS) IMPLANT
CANNULAE RT ANGLE VENOUS 36FR (CANNULA) ×2
CATH CPB KIT HENDRICKSON (MISCELLANEOUS) ×2 IMPLANT
CATH HEART VENT LEFT (CATHETERS) ×1 IMPLANT
CATH ROBINSON RED A/P 18FR (CATHETERS) ×7 IMPLANT
CATH THORACIC 36FR (CATHETERS) ×1 IMPLANT
CATH THORACIC 36FR RT ANG (CATHETERS) ×3 IMPLANT
CLIP FOGARTY SPRING 6M (CLIP) IMPLANT
CLOTH BEACON ORANGE TIMEOUT ST (SAFETY) ×2 IMPLANT
CONN 1/2X1/2X1/2  BEN (MISCELLANEOUS) ×1
CONN 1/2X1/2X1/2 BEN (MISCELLANEOUS) ×1 IMPLANT
CONN 3/8X1/2 ST GISH (MISCELLANEOUS) ×4 IMPLANT
CONN Y 3/8X3/8X3/8  BEN (MISCELLANEOUS)
CONN Y 3/8X3/8X3/8 BEN (MISCELLANEOUS) IMPLANT
CONT SPEC 4OZ CLIKSEAL STRL BL (MISCELLANEOUS) ×1 IMPLANT
COVER SURGICAL LIGHT HANDLE (MISCELLANEOUS) ×4 IMPLANT
CRADLE DONUT ADULT HEAD (MISCELLANEOUS) ×2 IMPLANT
DRAPE CARDIOVASCULAR INCISE (DRAPES) ×2
DRAPE SLUSH MACHINE 52X66 (DRAPES) IMPLANT
DRAPE SLUSH/WARMER DISC (DRAPES) IMPLANT
DRAPE SRG 135X102X78XABS (DRAPES) ×1 IMPLANT
DRSG COVADERM 4X14 (GAUZE/BANDAGES/DRESSINGS) ×2 IMPLANT
ELECT CAUTERY BLADE 6.4 (BLADE) IMPLANT
ELECT REM PT RETURN 9FT ADLT (ELECTROSURGICAL) ×4
ELECTRODE REM PT RTRN 9FT ADLT (ELECTROSURGICAL) ×2 IMPLANT
FELT TEFLON 6X6 (MISCELLANEOUS) ×1 IMPLANT
GLOVE BIO SURGEON STRL SZ 6 (GLOVE) ×1 IMPLANT
GLOVE BIO SURGEON STRL SZ 6.5 (GLOVE) ×1 IMPLANT
GLOVE BIOGEL PI IND STRL 6.5 (GLOVE) IMPLANT
GLOVE BIOGEL PI IND STRL 7.0 (GLOVE) IMPLANT
GLOVE BIOGEL PI INDICATOR 6.5 (GLOVE) ×2
GLOVE BIOGEL PI INDICATOR 7.0 (GLOVE) ×1
GLOVE EUDERMIC 7 POWDERFREE (GLOVE) ×6 IMPLANT
GOWN PREVENTION PLUS XLARGE (GOWN DISPOSABLE) ×1 IMPLANT
GOWN STRL NON-REIN LRG LVL3 (GOWN DISPOSABLE) ×10 IMPLANT
GRAFT WOVEN D/V 28DX30L (Vascular Products) ×1 IMPLANT
HEMOSTAT POWDER SURGIFOAM 1G (HEMOSTASIS) ×7 IMPLANT
HEMOSTAT SURGICEL 2X14 (HEMOSTASIS) ×4 IMPLANT
INSERT FOGARTY XLG (MISCELLANEOUS) ×1 IMPLANT
KIT BASIN OR (CUSTOM PROCEDURE TRAY) ×2 IMPLANT
KIT PAIN CUSTOM (MISCELLANEOUS) IMPLANT
KIT ROOM TURNOVER OR (KITS) ×2 IMPLANT
KIT SUCTION CATH 14FR (SUCTIONS) ×4 IMPLANT
LINE VENT (MISCELLANEOUS) ×1 IMPLANT
NDL SUT 4 .5 CRC FRENCH EYE (NEEDLE) IMPLANT
NEEDLE FRENCH EYE (NEEDLE) ×2
NS IRRIG 1000ML POUR BTL (IV SOLUTION) ×13 IMPLANT
PACK OPEN HEART (CUSTOM PROCEDURE TRAY) ×2 IMPLANT
PAD ARMBOARD 7.5X6 YLW CONV (MISCELLANEOUS) ×4 IMPLANT
PAD ELECT DEFIB RADIOL ZOLL (MISCELLANEOUS) ×1 IMPLANT
RING ANLPLS CARP-EDW PHY II 30 (Prosthesis & Implant Heart) IMPLANT
RING ANNULOPLASTY PHY II (Prosthesis & Implant Heart) ×2 IMPLANT
SET CARDIOPLEGIA MPS 5001102 (MISCELLANEOUS) ×1 IMPLANT
SPONGE GAUZE 4X4 12PLY (GAUZE/BANDAGES/DRESSINGS) ×3 IMPLANT
SUCKER INTRACARDIAC WEIGHTED (SUCKER) ×1 IMPLANT
SUCKER WEIGHTED FLEX (MISCELLANEOUS) ×2 IMPLANT
SUT BONE WAX W31G (SUTURE) ×2 IMPLANT
SUT ETHIBON 2 0 V 52N 30 (SUTURE) ×4 IMPLANT
SUT ETHIBON EXCEL 2-0 V-5 (SUTURE) IMPLANT
SUT ETHIBOND 2 0 SH (SUTURE) ×11 IMPLANT
SUT ETHIBOND 2 0 SH 36X2 (SUTURE) ×2 IMPLANT
SUT ETHIBOND 2 0 V4 (SUTURE) IMPLANT
SUT ETHIBOND 2 0V4 GREEN (SUTURE) IMPLANT
SUT ETHIBOND 4 0 RB 1 (SUTURE) IMPLANT
SUT ETHIBOND V-5 VALVE (SUTURE) IMPLANT
SUT PROLENE 3 0 SH 1 (SUTURE) IMPLANT
SUT PROLENE 3 0 SH DA (SUTURE) ×6 IMPLANT
SUT PROLENE 3 0 SH1 36 (SUTURE) ×2 IMPLANT
SUT PROLENE 4 0 RB 1 (SUTURE) ×38
SUT PROLENE 4 0 SH DA (SUTURE) ×3 IMPLANT
SUT PROLENE 4-0 RB1 .5 CRCL 36 (SUTURE) ×2 IMPLANT
SUT PROLENE 5 0 C 1 36 (SUTURE) ×6 IMPLANT
SUT PROLENE 5 0 CC1 (SUTURE) ×2 IMPLANT
SUT SILK  1 MH (SUTURE) ×2
SUT SILK 1 MH (SUTURE) ×2 IMPLANT
SUT SILK 1 TIES 10X30 (SUTURE) ×2 IMPLANT
SUT SILK 2 0 (SUTURE) ×2
SUT SILK 2 0 SH CR/8 (SUTURE) ×4 IMPLANT
SUT SILK 2-0 18XBRD TIE 12 (SUTURE) ×1 IMPLANT
SUT SILK 3 0 SH CR/8 (SUTURE) ×2 IMPLANT
SUT SILK 4 0 (SUTURE) ×2
SUT SILK 4-0 18XBRD TIE 12 (SUTURE) ×1 IMPLANT
SUT STEEL 6MS V (SUTURE) IMPLANT
SUT STEEL SZ 6 DBL 3X14 BALL (SUTURE) IMPLANT
SUT TEM PAC WIRE 2 0 SH (SUTURE) ×8 IMPLANT
SUT VIC AB 1 CTX 36 (SUTURE) ×4
SUT VIC AB 1 CTX36XBRD ANBCTR (SUTURE) ×2 IMPLANT
SUT VIC AB 2-0 CTX 27 (SUTURE) IMPLANT
SUT VIC AB 3-0 X1 27 (SUTURE) IMPLANT
SUTURE E-PAK OPEN HEART (SUTURE) ×2 IMPLANT
SYR 10ML KIT SKIN ADHESIVE (MISCELLANEOUS) IMPLANT
SYSTEM SAHARA CHEST DRAIN ATS (WOUND CARE) ×2 IMPLANT
TAPE CLOTH SURG 4X10 WHT LF (GAUZE/BANDAGES/DRESSINGS) ×1 IMPLANT
TOWEL OR 17X24 6PK STRL BLUE (TOWEL DISPOSABLE) ×2 IMPLANT
TOWEL OR 17X26 10 PK STRL BLUE (TOWEL DISPOSABLE) ×2 IMPLANT
TRAY FOLEY IC TEMP SENS 14FR (CATHETERS) ×2 IMPLANT
TUBE FEEDING 8FR 16IN STR KANG (MISCELLANEOUS) ×2 IMPLANT
UNDERPAD 30X30 INCONTINENT (UNDERPADS AND DIAPERS) ×2 IMPLANT
VALVE AORTIC MAGNA 23MM (Prosthesis & Implant Heart) ×1 IMPLANT
VENT LEFT HEART 12002 (CATHETERS) ×2
VRC MALLEABLE SINGLE STG 30FR (MISCELLANEOUS) ×2
WATER STERILE IRR 1000ML POUR (IV SOLUTION) ×4 IMPLANT

## 2011-08-04 NOTE — Brief Op Note (Addendum)
08/04/2011  1:52 PM  PATIENT:  [name]  51 y.o. female  PRE-OPERATIVE DIAGNOSIS:  AORTIC STENOSIS, MITRAL REGURGITATION, TRISCUPID REGURGITATION  POST-OPERATIVE DIAGNOSIS:  AORTIC STENOSIS, MITRAL REGURGITATION, TRISCUPID   PROCEDURE:  Procedure(s) (LRB): AORTIC VALVE REPLACEMENT 23mm Edwards Magna Ease Pericardial Valve Conduit Aortic Root Enlargement utilizing a Hemashield Graft  MITRAL VALVE Ring Annuloplasty utilizing 30mm Edwards Physio II Ring  SURGEON:  Surgeon(s) and Role:    * Loreli Slot, MD - Primary  PHYSICIAN ASSISTANT: Lowella Dandy PA-C  ANESTHESIA:   general  EBL:  Total I/O In: 2000 [I.V.:2000] Out: 1475 [Urine:1475]  BLOOD ADMINISTERED:  Platelets, 2 units PRBCs  DRAINS:  Chest Tube(s) - mediastinal x 2  SPECIMEN:  Source of Specimen:  Aortic Valve,  DISPOSITION OF SPECIMEN:  PATHOLOGY  PATIENT DISPOSITION:  ICU - intubated and hemodynamically stable.  XC 161 min CPB 274 min'  Off pump on dopamine @ 5 mcg/kg/min, Milrinone @ 0.25 mcg/kg/min

## 2011-08-04 NOTE — Progress Notes (Signed)
CRITICAL VALUE ALERT  Critical value received:  Glucose 66  Date of notification:  08/04/11  Time of notification:  1749  Critical value read back:no  Nurse who received alert:  S.Meredith Mody, RN  MD notified (1st page):  N/A  Time of first page:    MD notified (2nd page):  Time of second page:  Responding MD:    Time MD responded:    14mL of D50 given IV push per protocol.  Repeat glucose at 1607 79.

## 2011-08-04 NOTE — Preoperative (Signed)
Beta Blockers   Reason not to administer Beta Blockers:Not Applicable 

## 2011-08-04 NOTE — Anesthesia Procedure Notes (Signed)
Procedure Name: Intubation Date/Time: 08/04/2011 9:01 AM Performed by: Rogelia Boga Pre-anesthesia Checklist: Patient identified, Emergency Drugs available, Suction available, Patient being monitored and Timeout performed Patient Re-evaluated:Patient Re-evaluated prior to inductionOxygen Delivery Method: Circle system utilized Preoxygenation: Pre-oxygenation with 100% oxygen Intubation Type: IV induction Ventilation: Mask ventilation without difficulty Laryngoscope Size: Mac and 3 Grade View: Grade I Tube type: Oral Tube size: 7.5 mm Number of attempts: 1 Airway Equipment and Method: Stylet Placement Confirmation: ETT inserted through vocal cords under direct vision,  positive ETCO2 and breath sounds checked- equal and bilateral Secured at: 21 cm Tube secured with: Tape Dental Injury: Teeth and Oropharynx as per pre-operative assessment

## 2011-08-04 NOTE — Procedures (Addendum)
Extubation Procedure Note  Patient Details:   Name: [name] DOB: 09/11/1960 MRN: [id number]   Airway Documentation:  Airway 7.5 mm (Active)  Secured at (cm) 21 cm 08/04/2011  8:00 PM  Measured From Lips 08/04/2011  8:00 PM  Secured Location Center 08/04/2011  8:00 PM  Secured By Pink Tape 08/04/2011  7:27 PM    Evaluation  O2 sats: stable throughout Complications: No apparent complications Patient did tolerate procedure well. Bilateral Breath Sounds: Clear;Diminished Suctioning: Airway Yes  [name] 08/04/2011, 8:53 PM

## 2011-08-04 NOTE — Progress Notes (Signed)
Rapid urine drug screen done as per dr frederick,since drug screen not back yet.

## 2011-08-04 NOTE — Anesthesia Preprocedure Evaluation (Addendum)
Anesthesia Evaluation  Patient identified by MRN, date of birth, ID band Patient awake    Reviewed: Allergy & Precautions, H&P , NPO status , Patient's Chart, lab work & pertinent test results  Airway Mallampati: II TM Distance: >3 FB Neck ROM: full    Dental  (+) Dental Advisory Given   Pulmonary shortness of breath, COPDformer smoker         Cardiovascular hypertension, Pt. on home beta blockers +CHF and + Orthopnea + dysrhythmias + Valvular Problems/Murmurs AS and MR     Neuro/Psych PSYCHIATRIC DISORDERS Anxiety    GI/Hepatic   Endo/Other    Renal/GU      Musculoskeletal   Abdominal   Peds  Hematology   Anesthesia Other Findings   Reproductive/Obstetrics                          Anesthesia Physical Anesthesia Plan  ASA: III  Anesthesia Plan: General   Post-op Pain Management:    Induction: Intravenous  Airway Management Planned: Oral ETT  Additional Equipment: Arterial line, CVP, PA Cath and TEE  Intra-op Plan:   Post-operative Plan: Post-operative intubation/ventilation  Informed Consent: I have reviewed the patients History and Physical, chart, labs and discussed the procedure including the risks, benefits and alternatives for the proposed anesthesia with the patient or authorized representative who has indicated his/her understanding and acceptance.     Plan Discussed with: CRNA, Surgeon and Anesthesiologist  Anesthesia Plan Comments:        Anesthesia Quick Evaluation

## 2011-08-04 NOTE — OR Nursing (Signed)
Called volunteer desk at 1510 to inform family patient is off pump. Made first call to 2300 SICU at 1522. Made second call to 2300 SICU at 1548.

## 2011-08-04 NOTE — H&P (Addendum)
Reason for Consult:Consider AVR  Referring Physician: Dr. [place]  [name] is an 51 y.o. female.  HPI: 51 yo female with history of cardiomyopathy and CHF. About 2 weeks PTA she began having increased SOB, orthopnea and peripheral edema. She finally came to ED after syncopal episode. She says the room started spinning then she woke up on floor having fallen forward and there was blood from having tongue laceration from fall. Admitted with CHF. Symptoms improved quickly with medical treatment and diuresis.  Past Medical History   Diagnosis  Date   .  CHF (congestive heart failure)    .  Pulmonary nodule, left      f/u chest CT 2/12: interval clearing of RUL air space nodule, borderline enlarged mediastinal lymph nodes stable, pul. arterial HTN   .  Emphysema    .  Ovarian cyst      ovarian cystic mass   .  Cocaine abuse    .  Alcohol abuse    .  Hypertension      pulmonary   .  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  Mitral stenosis    .  Nonischemic cardiomyopathy      a. cath 6/08: pLAD 30%, mLAD 30%, oCFX 40%, dCVS 40%; OM 50%; dRCA 40%; b. echo 06/01/10: severe LVH; EF 45-50%; inf HK; mild to mod AS (mean 30), mod. AI; mild to mod MS; mod MR; severe LAE, mod TR, mod PR, PASP 59; c. w/u for Amyloid done in past   .  Tubal pregnancy      Hx   .  Cannabis abuse    .  Fibroids      abd. CT 2/12: large fibroid uterus   .  Personal history of noncompliance with medical treatment, presenting hazards to health    .  Pulmonary HTN     Past Surgical History   Procedure  Date   .  Tubal ligation    .  Cardiac catheterization  2008    Family History   Problem  Relation  Age of Onset   .  Coronary artery disease     .  Hypertension  Mother    Social History: reports that she quit smoking about a year ago. Her smoking use included Cigarettes. She has a 10 pack-year smoking history. She quit smokeless tobacco use about a year ago. She reports that she drinks about  .6 ounces of alcohol per week. She reports that she uses illicit drugs (Marijuana).  She says she was "around" cocaine New Year's Eve but didn't use any. Tox screen +  Allergies:  Allergies   Allergen  Reactions   .  Penicillins  Hives   Medications:  Prior to Admission:  Prescriptions prior to admission   Medication  Sig  Dispense  Refill   .  amLODipine (NORVASC) 10 MG tablet  TAKE ONE TABLET BY MOUTH EVERY DAY  30 tablet  6   .  carvedilol (COREG) 25 MG tablet  Take 1 tablet (25 mg total) by mouth 2 (two) times daily with a meal.  60 tablet  12   .  cetirizine (ZYRTEC) 10 MG tablet  Take 10 mg by mouth daily.     .  enalapril (VASOTEC) 20 MG tablet  TAKE ONE TABLET BY MOUTH TWICE DAILY  60 tablet  12   .  furosemide (LASIX) 40 MG tablet  Take two tablets twice daily  120 tablet  12   .  potassium chloride (KLOR-CON) 20 MEQ packet  Take 20 mEq by mouth 2 (two) times daily.  60 tablet  12    Results for orders placed during the hospital encounter of 05/28/11 (from the past 48 hour(s))   BASIC METABOLIC PANEL Status: Abnormal    Collection Time    06/07/11 4:47 AM   Component  Value  Range  Comment    Sodium  139  135 - 145 (mEq/L)     Potassium  3.8  3.5 - 5.1 (mEq/L)     Chloride  103  96 - 112 (mEq/L)     CO2  25  19 - 32 (mEq/L)     Glucose, Bld  89  70 - 99 (mg/dL)     BUN  15  6 - 23 (mg/dL)     Creatinine, Ser  0.98  0.50 - 1.10 (mg/dL)     Calcium  9.2  8.4 - 10.5 (mg/dL)     GFR calc non Af Amer  81 (*)  >90 (mL/min)     GFR calc Af Amer  >90  >90 (mL/min)    POCT I-STAT 3, BLOOD GAS (G3+) Status: Abnormal    Collection Time    06/07/11 9:52 AM   Component  Value  Range  Comment    pH, Arterial  7.360  7.350 - 7.400     pCO2 arterial  41.6  35.0 - 45.0 (mmHg)     pO2, Arterial  71.0 (*)  80.0 - 100.0 (mmHg)     Bicarbonate  23.5  20.0 - 24.0 (mEq/L)     TCO2  25  0 - 100 (mmol/L)     O2 Saturation  93.0      Acid-base deficit  2.0  0.0 - 2.0 (mmol/L)     Sample type   ARTERIAL     POCT I-STAT 3, BLOOD GAS (G3P V) Status: Abnormal    Collection Time    06/07/11 9:53 AM   Component  Value  Range  Comment    pH, Ven  7.317 (*)  7.250 - 7.300     pCO2, Ven  45.6  45.0 - 50.0 (mmHg)     pO2, Ven  34.0  30.0 - 45.0 (mmHg)     Bicarbonate  23.3  20.0 - 24.0 (mEq/L)     TCO2  25  0 - 100 (mmol/L)     O2 Saturation  60.0      Acid-base deficit  3.0 (*)  0.0 - 2.0 (mmol/L)     Sample type  VENOUS      Comment  NOTIFIED PHYSICIAN     CBC Status: Abnormal    Collection Time    06/07/11 1:29 PM   Component  Value  Range  Comment    WBC  3.9 (*)  4.0 - 10.5 (K/uL)     RBC  3.81 (*)  3.87 - 5.11 (MIL/uL)     Hemoglobin  12.5  12.0 - 15.0 (g/dL)     HCT  11.9  14.7 - 46.0 (%)     MCV  96.9  78.0 - 100.0 (fL)     MCH  32.8  26.0 - 34.0 (pg)     MCHC  33.9  30.0 - 36.0 (g/dL)     RDW  82.9  56.2 - 15.5 (%)     Platelets  248  150 - 400 (K/uL)    CREATININE, SERUM Status: Normal    Collection Time  06/07/11 1:29 PM   Component  Value  Range  Comment    Creatinine, Ser  0.78  0.50 - 1.10 (mg/dL)     GFR calc non Af Amer  >90  >90 (mL/min)     GFR calc Af Amer  >90  >90 (mL/min)    BASIC METABOLIC PANEL Status: Abnormal    Collection Time    06/08/11 5:40 AM   Component  Value  Range  Comment    Sodium  136  135 - 145 (mEq/L)     Potassium  5.3 (*)  3.5 - 5.1 (mEq/L)     Chloride  104  96 - 112 (mEq/L)     CO2  23  19 - 32 (mEq/L)     Glucose, Bld  89  70 - 99 (mg/dL)     BUN  15  6 - 23 (mg/dL)     Creatinine, Ser  1.61  0.50 - 1.10 (mg/dL)     Calcium  9.0  8.4 - 10.5 (mg/dL)     GFR calc non Af Amer  >90  >90 (mL/min)     GFR calc Af Amer  >90  >90 (mL/min)    PRO B NATRIURETIC PEPTIDE Status: Abnormal    Collection Time    06/08/11 5:40 AM   Component  Value  Range  Comment    Pro B Natriuretic peptide (BNP)  2357.0 (*)  0 - 125 (pg/mL)    ECHO:-  Left ventricle: The cavity size was moderately dilated. Systolic function was severely reduced. The  estimated ejection fraction was in the range of 20% to 25%. Doppler parameters are consistent with a reversible restrictive pattern, indicative of decreased left ventricular diastolic compliance and/or increased left atrial pressure (grade 3 diastolic dysfunction). - Aortic valve: There was moderate stenosis. Moderate to severe regurgitation. 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 In apical five chamber and parasternal long axisview, leaflets visualizedappear to open well. - Mitral valve: Moderate to severe regurgitation. - Left atrium: The atrium was moderately dilated. - Right ventricle: The cavity size was mildly dilated. Wall thickness was normal. - Right atrium: The atrium was mildly dilated. - Tricuspid valve: Moderate regurgitation. - Pulmonary arteries: Systolic pressure was moderately increased. PA peak pressure: 49mm Hg (S). Impressions:  - When compared to 06/01/10, EF is reduced. Consider TEE or other imaging modality to demonstrate distal left ventricular outflow/ aortic root as well as valve leaflets.  CATH:  Hemodynamics  RA 8/9 with a mean of 8 mm of mercury  RV 41/11 mmHg  PA 41/17 with a mean of 28 mm mercury  PCWP 16/15 with a mean of 13 mm mercury  LV 131/10 mmHg  AO 98/62 with a mean of 75 mmHg  Oxygen saturations:  PA 60%  AO 93%  Cardiac Output (Fick) 4.18 L per minute Thermodilution output: 3.96 L per minute  Cardiac Index (Fick) 2.3 teeters per minute per meter square Thermodilution index: 2.18 L per minute per meter square  Aortic valve mean gradient is 18 mm mercury with an aortic valve area of 1.3 cm square and an index of 0.72.  Mitral valve mean gradient is 4.6 mm mercury. Valve area is 2.55 cm square.  Final Conclusions:  1. Moderate 2 vessel coronary disease.  2. Severe left ventricular dysfunction.  3. Moderate aortic stenosis with moderate to severe aortic  insufficiency.  4. Mild pulmonary hypertension.  5. Normal left ventricular  filling pressures indicate adequate diuresis.  No results found.  Review of Systems  Constitutional: Positive for malaise/fatigue. Negative for fever, chills and weight loss.  Respiratory: Positive for cough, shortness of breath and wheezing. Negative for hemoptysis and sputum production.  Cardiovascular: Positive for leg swelling. Negative for chest pain, palpitations and claudication.  Gastrointestinal: Negative.  Genitourinary: Negative.  Musculoskeletal: Positive for falls. Negative for myalgias.  Skin: Negative.  Neurological: Positive for dizziness, loss of consciousness and weakness.  Endo/Heme/Allergies: Negative.  Psychiatric/Behavioral:  Tox screen + cocaine, says she was last "around" it 12/31, but "did not use"  History of marijuana  Blood pressure 104/68, pulse 78, temperature 98.8 F (37.1 C), temperature source Oral, resp. rate 18, height 5\' 5"  (1.651 m), weight 77.338 kg (170 lb 8 oz), SpO2 97.00%.  Physical Exam  Vitals reviewed.  Constitutional: She is oriented to person, place, and time. She appears well-developed and well-nourished. No distress.  HENT:  Head: Normocephalic and atraumatic.  Eyes: EOM are normal. Pupils are equal, round, and reactive to light.  Neck: Normal range of motion. No JVD present. No thyromegaly present.  Cardiovascular: Normal rate, regular rhythm and intact distal pulses. Exam reveals no friction rub.  Murmur (2/6 systolic, + diastolic) heard.  Respiratory: Effort normal and breath sounds normal. She has no wheezes. She has no rales.  GI: Soft. There is no tenderness.  Musculoskeletal: She exhibits no edema.  Lymphadenopathy:  She has no cervical adenopathy.  Neurological: She is alert and oriented to person, place, and time.  Skin: Skin is warm and dry.  Psychiatric: She has a normal mood and affect.  Assessment/Plan:  51 yo woman with severe cardiomyopathy  and multiple valvular lesions, including AS, AI, MR, MS and TR. TEE has been done but I've been unable to access report yet. Will d/w with Dr. Jens Som.  Erin Uecker C  06/08/2011, 5:50 PM    Unable to locate note from prior office visit.  In interim has been doing fairly well clinically. Dobutamine echo showed marked deterioration of LV function with stress. C/w AS being clinically significant. I discussed with her proceeding with surgery, but with less than certain outcome in terms of symptom improvement. She understands plan for AVR or possible root replacement, possible MV repair and possible TV repair, depending on intraoperative TEE findings. She is not a candidate, nor does she desire, to have a mechanical valve. Therefore plan pericardial valve. I have discussed with the patient the general nature of the procedure, need for general anesthesia,and incisions to be used. I have discussed the expected hospital stay, overall recovery and short and long term outcomes. She understands the risks include but are not limited to death, stroke, MI, DVT/PE, bleeding, possible need for transfusion, infections, heart block necessitating permanent pacemaker placement, and other organ system dysfunction including respiratory, renal, or GI complications. She understands and accepts these risks and agrees to proceed.

## 2011-08-04 NOTE — Progress Notes (Signed)
*  PRELIMINARY RESULTS* Echocardiogram 2D Echocardiogram has been performed.  [name] 08/04/2011, 9:47 AM

## 2011-08-04 NOTE — Anesthesia Postprocedure Evaluation (Signed)
  Anesthesia Post-op Note  Patient: [name]  Procedure(s) Performed: Procedure(s) (LRB): AORTIC VALVE REPLACEMENT (AVR) (N/A) MITRAL VALVE REPAIR (MVR) (N/A)  Patient Location: SICU  Anesthesia Type: General  Level of Consciousness: sedated  Airway and Oxygen Therapy: Patient remains intubated per anesthesia plan and Patient placed on Ventilator (see vital sign flow sheet for setting)  Post-op Pain: Pt sedated  Post-op Assessment: Post-op Vital signs reviewed, Patient's Cardiovascular Status Stable, Respiratory Function Stable and Patent Airway  Post-op Vital Signs: Reviewed and stable  Complications: No apparent anesthesia complications

## 2011-08-04 NOTE — Progress Notes (Signed)
Patient ID: Hiliana Eilts, female   DOB: 1961/01/27, 51 y.o.   MRN: [id number]   Filed Vitals:   08/04/11 2130 08/04/11 2145 08/04/11 2200 08/04/11 2215  BP: 99/65 96/65 95/65  79/58  Pulse: 90 90 79 90  Temp: 100 F (37.8 C) 100.2 F (37.9 C) 100.2 F (37.9 C) 100.4 F (38 C)  TempSrc:      Resp: 23 22 22 21   Weight:      SpO2: 92% 91% 92% 92%   CI = 2.4  Extubated  Urine ouput good  CT output low.    BMET    Component Value Date/Time   NA 143 08/04/2011 1639   K 3.4* 08/04/2011 1639   CL 103 08/02/2011 1143   CO2 23 08/02/2011 1143   GLUCOSE 103* 08/04/2011 1639   BUN 17 08/02/2011 1143   CREATININE 0.74 08/02/2011 1143   CALCIUM 8.3* 08/02/2011 1143   GFRNONAA >90 08/02/2011 1143   GFRAA >90 08/02/2011 1143    A/P:  Stable postop

## 2011-08-04 NOTE — OR Nursing (Signed)
Patient's life vest external defibrillator removed prior to surgery in the OR. Place in plastic bag with patient sticker. Will transfer with patient to 2300 postop. R2 pads will be left on patient to transfer to 2300.

## 2011-08-04 NOTE — Interval H&P Note (Signed)
History and Physical Interval Note:  08/04/2011 8:42 AM  [name]  has presented today for surgery, with the diagnosis of AORTIC STENOSIS, MITRAL REGURGITATION, TRISCUPID REGURGITATION  The various methods of treatment have been discussed with the patient and family. After consideration of risks, benefits and other options for treatment, the patient has consented to  Procedure(s) (LRB): AORTIC VALVE REPLACEMENT (AVR) (N/A) MITRAL VALVE REPAIR (MVR) (N/A) TRICUSPID VALVE REPAIR (N/A) as a surgical intervention .  The patients' history has been reviewed, patient examined, no change in status, stable for surgery.  I have reviewed the patients' chart and labs.  Questions were answered to the patient's satisfaction.     Armany Mano C

## 2011-08-04 NOTE — Transfer of Care (Signed)
Immediate Anesthesia Transfer of Care Note  Patient: [name]  Procedure(s) Performed: Procedure(s) (LRB): AORTIC VALVE REPLACEMENT (AVR) (N/A) MITRAL VALVE REPAIR (MVR) (N/A)  Patient Location: SICU  Anesthesia Type: General  Level of Consciousness: sedated and unresponsive  Airway & Oxygen Therapy: Patient placed on Ventilator (see vital sign flow sheet for setting)  Post-op Assessment: Report given to PACU RN and Post -op Vital signs reviewed and stable  Post vital signs: Reviewed and stable  Complications: No apparent anesthesia complications

## 2011-08-05 ENCOUNTER — Encounter (HOSPITAL_COMMUNITY): Payer: Self-pay | Admitting: Thoracic Surgery (Cardiothoracic Vascular Surgery)

## 2011-08-05 ENCOUNTER — Inpatient Hospital Stay (HOSPITAL_COMMUNITY): Payer: Medicaid Other

## 2011-08-05 DIAGNOSIS — I428 Other cardiomyopathies: Secondary | ICD-10-CM

## 2011-08-05 DIAGNOSIS — I359 Nonrheumatic aortic valve disorder, unspecified: Secondary | ICD-10-CM

## 2011-08-05 LAB — POCT I-STAT, CHEM 8
Calcium, Ion: 1.2 mmol/L (ref 1.12–1.32)
Creatinine, Ser: 1.1 mg/dL (ref 0.50–1.10)
Glucose, Bld: 120 mg/dL — ABNORMAL HIGH (ref 70–99)
HCT: 36 % (ref 36.0–46.0)
Hemoglobin: 12.2 g/dL (ref 12.0–15.0)
TCO2: 20 mmol/L (ref 0–100)

## 2011-08-05 LAB — BASIC METABOLIC PANEL
BUN: 9 mg/dL (ref 6–23)
Calcium: 8 mg/dL — ABNORMAL LOW (ref 8.4–10.5)
Chloride: 109 mEq/L (ref 96–112)
Creatinine, Ser: 0.72 mg/dL (ref 0.50–1.10)
GFR calc Af Amer: >90 mL/min (ref >90)
GFR calc non Af Amer: >90 mL/min (ref >90)

## 2011-08-05 LAB — PREPARE PLATELET PHERESIS

## 2011-08-05 LAB — TYPE AND SCREEN
ABO/RH(D): B POS
Antibody Screen: NEGATIVE
Unit division: 0

## 2011-08-05 LAB — MAGNESIUM
Magnesium: 2.4 mg/dL (ref 1.5–2.5)
Magnesium: 2.4 mg/dL (ref 1.5–2.5)

## 2011-08-05 LAB — CBC
HCT: 34.8 % — ABNORMAL LOW (ref 36.0–46.0)
Hemoglobin: 11.9 g/dL — ABNORMAL LOW (ref 12.0–15.0)
MCH: 31.7 pg (ref 26.0–34.0)
MCHC: 34.1 g/dL (ref 30.0–36.0)
MCHC: 34.2 g/dL (ref 30.0–36.0)
MCV: 91.5 fL (ref 78.0–100.0)
Platelets: 62 10*3/uL — ABNORMAL LOW (ref 150–400)
RBC: 3.75 MIL/uL — ABNORMAL LOW (ref 3.87–5.11)
RDW: 16.4 % — ABNORMAL HIGH (ref 11.5–15.5)
WBC: 11.1 10*3/uL — ABNORMAL HIGH (ref 4.0–10.5)

## 2011-08-05 LAB — GLUCOSE, CAPILLARY: Glucose-Capillary: 128 mg/dL — ABNORMAL HIGH (ref 70–99)

## 2011-08-05 LAB — CREATININE, SERUM
Creatinine, Ser: 1.02 mg/dL (ref 0.50–1.10)
GFR calc non Af Amer: 63 mL/min — ABNORMAL LOW (ref >90)

## 2011-08-05 MED ORDER — MUPIROCIN 2 % EX OINT
1.0000 "application " | TOPICAL_OINTMENT | Freq: Two times a day (BID) | CUTANEOUS | Status: AC
  Administered 2011-08-05 – 2011-08-09 (×9): 1 via NASAL
  Filled 2011-08-05 (×2): qty 22

## 2011-08-05 MED ORDER — INSULIN GLARGINE 100 UNIT/ML ~~LOC~~ SOLN
20.0000 [IU] | SUBCUTANEOUS | Status: DC
  Administered 2011-08-05 – 2011-08-08 (×4): 20 [IU] via SUBCUTANEOUS

## 2011-08-05 MED ORDER — POTASSIUM CHLORIDE 10 MEQ/50ML IV SOLN
10.0000 meq | INTRAVENOUS | Status: AC | PRN
  Administered 2011-08-05 (×3): 10 meq via INTRAVENOUS
  Filled 2011-08-05: qty 150

## 2011-08-05 MED ORDER — CHLORHEXIDINE GLUCONATE CLOTH 2 % EX PADS
6.0000 | MEDICATED_PAD | Freq: Every day | CUTANEOUS | Status: AC
  Administered 2011-08-06 – 2011-08-09 (×4): 6 via TOPICAL

## 2011-08-05 MED ORDER — ALBUMIN HUMAN 5 % IV SOLN
12.5000 g | Freq: Once | INTRAVENOUS | Status: AC
  Administered 2011-08-05: 12.5 g via INTRAVENOUS

## 2011-08-05 MED ORDER — FUROSEMIDE 10 MG/ML IJ SOLN
40.0000 mg | Freq: Two times a day (BID) | INTRAMUSCULAR | Status: DC
  Filled 2011-08-05: qty 4

## 2011-08-05 MED ORDER — INSULIN ASPART 100 UNIT/ML ~~LOC~~ SOLN: 0.0000 [IU] | SUBCUTANEOUS | Status: DC

## 2011-08-05 MED ORDER — METOCLOPRAMIDE HCL 5 MG/ML IJ SOLN
10.0000 mg | Freq: Four times a day (QID) | INTRAMUSCULAR | Status: AC
  Administered 2011-08-05 – 2011-08-06 (×4): 10 mg via INTRAVENOUS
  Filled 2011-08-05 (×4): qty 2

## 2011-08-05 MED ORDER — FUROSEMIDE 10 MG/ML IJ SOLN
20.0000 mg | Freq: Two times a day (BID) | INTRAMUSCULAR | Status: DC
  Administered 2011-08-05: 20 mg via INTRAVENOUS
  Filled 2011-08-05 (×3): qty 2

## 2011-08-05 MED ORDER — CALCIUM CHLORIDE 10 % IV SOLN: 1.0000 g | Freq: Once | INTRAVENOUS | Status: DC

## 2011-08-05 MED ORDER — SODIUM CHLORIDE 0.9 % IV SOLN
1.0000 g | Freq: Once | INTRAVENOUS | Status: AC
  Administered 2011-08-05: 1 g via INTRAVENOUS
  Filled 2011-08-05: qty 10

## 2011-08-05 MED ORDER — ALBUMIN HUMAN 5 % IV SOLN
12.5000 g | INTRAVENOUS | Status: DC | PRN
  Administered 2011-08-05: 12.5 g via INTRAVENOUS
  Filled 2011-08-05: qty 250

## 2011-08-05 MED ORDER — FUROSEMIDE 10 MG/ML IJ SOLN
40.0000 mg | Freq: Two times a day (BID) | INTRAMUSCULAR | Status: DC
  Administered 2011-08-05 – 2011-08-07 (×5): 40 mg via INTRAVENOUS
  Filled 2011-08-05 (×9): qty 4

## 2011-08-05 MED ORDER — ALBUMIN HUMAN 5 % IV SOLN
INTRAVENOUS | Status: AC
  Administered 2011-08-05: 12.5 g via INTRAVENOUS
  Filled 2011-08-05: qty 250

## 2011-08-05 MED FILL — Magnesium Sulfate Inj 50%: INTRAMUSCULAR | Qty: 10 | Status: AC

## 2011-08-05 MED FILL — Dexmedetomidine HCl IV Soln 200 MCG/2ML: INTRAVENOUS | Qty: 2 | Status: AC

## 2011-08-05 MED FILL — Potassium Chloride Inj 2 mEq/ML: INTRAVENOUS | Qty: 40 | Status: AC

## 2011-08-05 NOTE — Progress Notes (Signed)
@   Subjective:  Chest "sore"; mild dyspnea   Objective:  Filed Vitals:   08/05/11 0600 08/05/11 0615 08/05/11 0630 08/05/11 0645  BP: 79/42 82/63 81/64  86/48  Pulse: 90 90 90 90  Temp: 100.4 F (38 C) 100.2 F (37.9 C) 100 F (37.8 C) 100 F (37.8 C)  TempSrc:      Resp: 19 19 18 20   Weight:      SpO2: 96% 95% 95% 94%    Intake/Output from previous day:  Intake/Output Summary (Last 24 hours) at 08/05/11 0657 Last data filed at 08/05/11 1610  Gross per 24 hour  Intake 9247.41 ml  Output   5530 ml  Net 3717.41 ml    Physical Exam: Physical exam: [name] well-nourished in no acute distress.  Skin is warm and dry.  HEENT is normal.  Neck is supple.  Chest is clear to auscultation with normal expansion anteriorly Cardiovascular exam is regular rate and rhythm. + rub; 2/6 systolic murmur LSB; no DM Abdominal exam distended Extremities show no edema. neuro grossly intact    Lab Results: Basic Metabolic Panel:  Basename 08/05/11 0242 08/04/11 2250 08/04/11 2245 08/02/11 1143  NA 140 143 -- --  K 3.6 3.4* -- --  CL 109 106 -- --  CO2 23 -- -- 23  GLUCOSE 121* 152* -- --  BUN 9 8 -- --  CREATININE 0.72 0.80 -- --  CALCIUM 8.0* -- -- 8.3*  MG 2.4 -- 2.7* --  PHOS -- -- -- --   CBC:  Basename 08/05/11 0242 08/04/11 2250 08/04/11 2245  WBC 11.1* -- 10.1  NEUTROABS -- -- --  HGB 11.0* 12.6 --  HCT 32.3* 37.0 --  MCV 91.5 -- 92.4  PLT 62* -- 78*     Assessment/Plan:  1) S/P aortic root enlargement/AVR/MV repair - doing reasonably well postop; diurese for postoperative volume excess; wean pressors as tolerated. Will arrange outpatient echo 8-12 weeks following DC 2) CM - resume ACE-I and beta blocker when BP allows 3) CAD - continue ASA and statin 4) H/O VT and syncope - will need life vest at DC; repeat echo 8-12 weeks; if EF< 35, will need ICD  Olga Millers 08/05/2011, 6:57 AM

## 2011-08-05 NOTE — Progress Notes (Signed)
1 Day Post-Op Procedure(s) (LRB): AORTIC VALVE REPLACEMENT (AVR) (N/A) MITRAL VALVE REPAIR (MVR) (N/A) Subjective: Bell/o incisional pain, some nausea  Objective: Vital signs in last 24 hours: Temp:  [98.4 F (36.9 Bell)-101.1 F (38.4 Bell)] 99.9 F (37.7 Bell) (04/18 0700) Pulse Rate:  [59-99] 90  (04/18 0700) Cardiac Rhythm:  [-] Atrial paced (04/18 0500) Resp:  [12-28] 24  (04/18 0700) BP: (67-128)/(41-79) 88/58 mmHg (04/18 0700) SpO2:  [91 %-98 %] 94 % (04/18 0700) Arterial Line BP: (80-129)/(48-81) 85/54 mmHg (04/18 0700) FiO2 (%):  [40 %-100 %] 40 % (04/17 1927) Weight:  [200 lb (90.719 kg)] 200 lb (90.719 kg) (04/18 0500)  Hemodynamic parameters for last 24 hours: PAP: (44-55)/(23-36) 49/31 mmHg CO:  [3 L/min-4.6 L/min] 4 L/min CI:  [1.6 L/min/m2-2.5 L/min/m2] 2.1 L/min/m2  Intake/Output from previous day: 04/17 0701 - 04/18 0700 In: 9247.4 [P.O.:300; I.V.:4986.4; Blood:1881; NG/GT:30; IV Piggyback:2050] Out: 5530 [Urine:3450; Emesis/NG output:100; Blood:1300; Chest Tube:680] Intake/Output this shift:    General appearance: alert and no distress Neurologic: intact Heart: regular rate and rhythm Lungs: diminished breath sounds bibasilar Abdomen: distended, nontender, hypoactive BS  Lab Results:  Basename 08/05/11 0242 08/04/11 2250 08/04/11 2245  WBC 11.1* -- 10.1  HGB 11.0* 12.6 --  HCT 32.3* 37.0 --  PLT 62* -- 78*   BMET:  Basename 08/05/11 0242 08/04/11 2250 08/02/11 1143  NA 140 143 --  K 3.6 3.4* --  CL 109 106 --  CO2 23 -- 23  GLUCOSE 121* 152* --  BUN 9 8 --  CREATININE 0.72 0.80 --  CALCIUM 8.0* -- 8.3*    PT/INR:  Basename 08/04/11 1643  LABPROT 20.8*  INR 1.76*   ABG    Component Value Date/Time   PHART 7.310* 08/04/2011 2254   HCO3 21.7 08/04/2011 2254   TCO2 23 08/04/2011 2254   ACIDBASEDEF 4.0* 08/04/2011 2254   O2SAT 89.0 08/04/2011 2254   CBG (last 3)   Basename 08/05/11 0400 08/04/11 2335 08/04/11 2200  GLUCAP 120* 140* 110*     Assessment/Plan: S/P Procedure(s) (LRB): AORTIC VALVE REPLACEMENT (AVR) (N/A) MITRAL VALVE REPAIR (MVR) (N/A) POD # 1 CV- BP relatively low, good cardiac output, wean milrinone, continue dopamine, wean neo as bP allows D/Bell swan once milrinone off  RESP- pulmonary toilet  RENAL- lytes, creatinine OK, diurese as BP allows  Anemia secondary to ABL- better after transfusion  Thrombocytopenia- no heparin  Ileus, gastric distention- reglan, do not advance diet    LOS: 1 day    [name] 08/05/2011

## 2011-08-05 NOTE — Progress Notes (Signed)
Patient ID: [name], female   DOB: October 30, 1960, 51 y.o.   MRN: [id number] Overall looks much better this evening  BP 104/73  Pulse 90  Temp(Src) 97.7 F (36.5 C) (Oral)  Resp 23  Wt 200 lb (90.719 kg)  SpO2 96%  Still on 5 of Dopamine, neo being weaned   Intake/Output Summary (Last 24 hours) at 08/05/11 1802 Last data filed at 08/05/11 1700  Gross per 24 hour  Intake 4896.37 ml  Output   1795 ml  Net 3101.37 ml    Minimal response to 20 mg lasix this AM, will try 40 mg tonight  Overall pleased with her progress

## 2011-08-05 NOTE — Op Note (Signed)
[name], [name]            ACCOUNT NO.:  [id number]  MEDICAL RECORD NO.:  [id number]  LOCATION:  2312                         FACILITY:  [facility]  PHYSICIAN:  Salvatore Decent. Dorris Fetch, M.D.DATE OF BIRTH:  Dec 21, 1960  DATE OF PROCEDURE:  08/04/2011 DATE OF DISCHARGE:                              OPERATIVE REPORT   PREOPERATIVE DIAGNOSES:  Severe aortic stenosis, moderate mitral regurgitation, moderate tricuspid regurgitation, dilated cardiomyopathy.  POSTOPERATIVE DIAGNOSES:  Severe aortic stenosis, moderate mitral regurgitation, mild tricuspid regurgitation, dilated cardiomyopathy.  PROCEDURE:  Median sternotomy, extracorporeal circulation, mitral valve repair with 30-mm Edwards Physio II ring angioplasty ring (model 5200, serial [id number]), aortic root enlargement with Hemashield patch, aortic valve replacement with [facility] Ease pericardial valve 23-mm (model 3300TFX, serial [id number]).  SURGEON:  Salvatore Decent. Dorris Fetch, MD  ASSISTANT:  Lowella Dandy, PA  ANESTHESIA:  General.  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  Postbypass transesophageal echocardiography revealed no residual mitral regurgitation, good function of the prosthetic valve.  CLINICAL NOTE:  [name] is a 51 year old woman with a history of dilated cardiomyopathy.  She had known aortic stenosis as well as moderate mitral regurgitation and mild-to-moderate tricuspid regurgitation.  She presented with heart failure and a syncopal episode in February, at that time, she had cardiac catheterization, which showed no severe coronary artery disease.  She did have an  echocardiogram which suggested severe aortic stenosis, but on reviewing the images, the aortic valve leaflets actually moved fairly well.  It did appear that she had a congenitally narrowed anulus and aortic root.  She was discharged at that time and subsequently had a dobutamine echocardiogram.  With that study, her mean gradient increased from 33-39 mmHg and her aortic valve area peak infusion was 0.77 centimeter squared.  Given these findings, she was advised to undergo aortic valve replacement.  It was discussed with her that she would need an annular enlargement procedure and this could be quite complex.  It was also discussed with her that we would assess her mitral valve as well as tricuspid valve for possible repair at the time of surgery.  The indications, risks, benefits, and alternatives for treatment were discussed in detail with the patient. She understood and accepted the risk of surgery and agreed to proceed. Given her history of drug use, she was not felt to be a good candidate for Coumadin lifelong anticoagulation.  In discussions with her, she made it clear that she would not want to be on Coumadin and understood that a tissue valve could fail and require re-replacement, but preferred that option to lifelong anticoagulation.  OPERATIVE NOTE:  [name] was brought to the preop holding area on August 04, 2011, there, the Anesthesia Service placed a Swan-Ganz catheter and an arterial blood pressure monitoring line. When attempting to pass the Swan-Ganz catheter into the pulmonary artery, it actually successfully floated into the pulmonary artery, but the patient had recurrent bursts of ventricular tachycardia.  The Swan-Ganz catheter was withdrawn  back into the right atrium and the ventricular tachycardia ceased.  She was taken to the operating room, anesthetized, and intubated.  Intravenous antibiotics were administered.  Transesophageal echocardiography was performed.   Please refer to the separately dictated note for full details of that procedure, but findings as previously noted.  There was mild tricuspid regurgitation, moderate mitral regurgitation, severe aortic stenosis, relatively good movement of the leaflets with a very small annulus and root.  There was dilated cardiomyopathy with an ejection fraction of 20%.  The chest, abdomen, and legs were prepped and draped in the usual sterile fashion.  The median sternotomy was performed.  Hemostasis was achieved.  The pericardium was opened.  The patient was fully heparinized. Anticoagulation was monitored with ACT measurement.  The aorta was inspected.  The ascending aorta appeared normal size with no atherosclerotic disease; however, it narrowed and tapered severely at the level of the sino-tubular junction.  There also was noted to be marked cardiomegaly.  After confirming adequate anticoagulation, the aorta was cannulated via concentric 2-0 Ethibond pledgeted pursestring sutures.  At this point, the procedure before going on bypass, the Swan- Ganz catheter was floated into the pulmonary artery and baseline cardiac output of 2.6 liters/minute was noted.  A 28-French single-stage venous cannula was placed via pursestring suture in the right atrial appendage and directed into the superior vena cava.  A 36-French right-angle venous cannula was placed via pursestring suture in the right atrium and directed into the inferior vena cava.  Cardiopulmonary bypass was instituted and the patient was cooled to 28 degrees Celsius.  A left ventricular vent was placed via pursestring suture in the right superior pulmonary vein and retrograde cardioplegic cannula was placed via pursestring suture in the right atrium and directed into the coronary sinus.  Foam pad was placed in the pericardium to insulate the heart and protect the left phrenic nerve.  A temperature probe was placed in the myocardial septum and  antegrade cardioplegia cannula was placed in the ascending aorta.  The aorta was crossclamped.  The left ventricle was emptied via the vents.  Cardiac arrest then was achieved with a combination of cold antegrade and retrograde blood cardioplegia and topical iced saline, and initial 500 mL of cardioplegia was administered antegrade.  There was some initial septal cooling, but she did have significant moderate-to- severe aortic insufficiency.  As the heart was starting to distend, the antegrade cardioplegia was discontinued and the remainder of the cardioplegia was administered retrograde throughout the procedure and the left ventricle was emptied via both vents. An additional 1.5 liters of cardioplegia was administered retrograde.  There was myocardial septal cooling to 10 degrees Celsius and there was good diastolic arrest.  Caval tapes were tightened on both the superior and inferior vena cava. The left atrium was entered via the interatrial groove.  The mitral valve was inspected.  There was some restriction of the posterior leaflet motion, there also was posterior annular dilatation.  The anterior leaflet was relatively normal, although there was some thickening in the central portion of the anterior leaflet due to the regurgitant jet.  The anterior leaflet was sized for 30-mm Edwards Physio II annuloplasty ring. 2-0 Ethibond horizontal mattress sutures then were placed circumferentially in the anulus, a total of 10 sutures were utilized.  The commissural suture was likewise sized for a 30-mm annuloplasty ring.  It should be noted that cardioplegia was administered at 20 minutes intervals during the entire crossclamp portion of the procedure.  The sutures  then were passed through the sewing ring of the annuloplasty ring.  It was lowered into place and the sutures were sequentially tied.  The left ventricle was distended with iced saline and had good competency.  The left ventricular  vent, which had been removed and passed into the left-sided pulmonary veins during the mitral repair, then was placed back through the mitral valve into the left ventricle.  The left atriotomy was closed with 2 layers of running 4-0 Prolene suture, the first was a running horizontal mattress suture followed by running simple suture.  De-airing was performed prior to tying these sutures.  Next, an aortotomy was made at the level of the sino-tubular junction. It was clear that the aortic root was extremely small for the patient's size.  The aortic valve leaflets were thickened.  There was no significant calcification.  There was some fusion of the right and left cusps at the commissure.  This was relatively minimal, but there was some thickening of the edges of both the right and left cusps of the valve.  It was cleared the valve could not be preserved, the valve leaflets were excised.  There was no annular calcification.  The anulus itself sized very small with a 19-mm Freestyle stentless valve sizer barely fitting into the annulus and, as suspected, it was clear that an annular enlargement was necessary.  An incision was made extending through the left/noncoronary commissure into the midportion of the anterior leaflet of the mitral valve.  A segment of Hemashield graft then was cut and fashioned to enlarge the annulus and aortic root.  It was sutured into place with running 4-0 Prolene sutures.  At this point, the annulus sized for a 23-mm [facility] Ease pericardial valve, this was prepared per [employer] recommendations.  The 2-0 Ethibond horizontal mattress sutures were placed circumferentially around the annulus and through the graft at the site where it had replaced the annulus, total of 17 sutures were utilized.  Once the valve had been rinsed, the sutures were passed through the sewing ring of the valve. The valve was lowered into place and sequentially tied.  This was  a relatively tight fit in the aorta, but the coronary ostia were inspected and there was no impingement.  It was clear at this point that there would be significant tension on the aortotomy to try and close this directly.  Therefore, an interposition graft using the 28-mm Hemashield graft was then anastomosed end-to-end to the proximal portion of the aortotomy with a running 4-0 Prolene suture.  It was then cut to length and anastomosed end-to-end with the distal portion of the ascending aorta again with a running 4-0 Prolene suture.  At the completion of this suture line, the patient was placed in Trendelenburg position.  A warm dose of retrograde cardioplegia was administered.  De-airing maneuvers were performed and the aortic crossclamp was removed.  Rewarming was started relatively late during the final aortic anastomosis and was relatively slow progressing.  The patient initially fibrillated, but converted to sinus rhythm on her own. 4-0 Ethibond horizontal mattress sutures with pledgets were utilized to control sites of bleeding on the aortic suture lines.  The superior venous cannula was repositioned into the right atrium and the inferior vena caval cannula was removed.  The retrograde cardioplegia cannula was removed.  After the patient had established sinus rhythm and rewarmed, epicardial pacing wires were placed on the right ventricle and right atrium.  A trial wean from bypass was done just  prior to the patient being warm enough to wean from bypass totally to inspect the repairs.  The left ventricular vent was removed just prior to this. There was no residual mitral regurgitation and appeared to be good function of the prosthetic valve.  The patient was placed back on full bypass.  She was loaded with milrinone and then an infusion was begun at 0.25 mcg/kg/minute.  The dopamine infusion at 5 mcg/kg/minute was also initiated.  She was DDD paced at 90 beats per minute.  When the  core temperature rose to 37 degrees Celsius, she was weaned from cardiopulmonary bypass on the first attempt.  Total bypass time was 274 minutes.  The crossclamp time had been 161 minutes.  The initial cardiac index was greater than 2 liters/minute/meter squared, and the patient remained hemodynamically stable throughout the postbypass period. Postbypass transesophageal echocardiography revealed good function of the prosthetic valve.  There was no residual mitral regurgitation and only mild tricuspid regurgitation.  A test dose of protamine was administered and was well tolerated.  The remaining atrial as well as the aortic cannulae were removed.  The remainder of the protamine was administered without incident. Hemostasis was achieved.  The chest was irrigated with warm saline.  Two mediastinal chest tubes were placed through separate subcostal incisions, one was placed anteriorly, a right-angle tube was placed posteriorly along the diaphragmatic surface of the heart.  The pericardium was reapproximated with interrupted 3-0 silk sutures.  The sternum was closed with interrupted heavy-gauge stainless steel wires.  The pectoralis fascia, subcutaneous tissue, and skin were closed in standard fashion.  All sponge, needle, and instrument counts were correct at the end of the procedure.  The patient was taken from the operating room to the surgical intensive care unit in good condition.     Salvatore Decent Dorris Fetch, M.D.     SCH/MEDQ  D:  08/04/2011  T:  08/05/2011  Job:  [id number]

## 2011-08-06 ENCOUNTER — Encounter (HOSPITAL_COMMUNITY): Payer: Self-pay | Admitting: *Deleted

## 2011-08-06 ENCOUNTER — Inpatient Hospital Stay (HOSPITAL_COMMUNITY): Payer: Medicaid Other

## 2011-08-06 DIAGNOSIS — E8770 Fluid overload, unspecified: Secondary | ICD-10-CM

## 2011-08-06 DIAGNOSIS — E8779 Other fluid overload: Secondary | ICD-10-CM

## 2011-08-06 LAB — BASIC METABOLIC PANEL
BUN: 16 mg/dL (ref 6–23)
CO2: 21 mEq/L (ref 19–32)
Calcium: 8.5 mg/dL (ref 8.4–10.5)
Glucose, Bld: 121 mg/dL — ABNORMAL HIGH (ref 70–99)
Sodium: 135 mEq/L (ref 135–145)

## 2011-08-06 LAB — CBC
MCH: 30.9 pg (ref 26.0–34.0)
MCHC: 32.8 g/dL (ref 30.0–36.0)
MCV: 94.3 fL (ref 78.0–100.0)
Platelets: 59 10*3/uL — ABNORMAL LOW (ref 150–400)
RBC: 3.88 MIL/uL (ref 3.87–5.11)

## 2011-08-06 LAB — GLUCOSE, CAPILLARY
Glucose-Capillary: 114 mg/dL — ABNORMAL HIGH (ref 70–99)
Glucose-Capillary: 105 mg/dL — ABNORMAL HIGH (ref 70–99)

## 2011-08-06 MED ORDER — INSULIN ASPART 100 UNIT/ML ~~LOC~~ SOLN
0.0000 [IU] | Freq: Three times a day (TID) | SUBCUTANEOUS | Status: DC
  Administered 2011-08-06: 3 [IU] via SUBCUTANEOUS

## 2011-08-06 MED FILL — Heparin Sodium (Porcine) Inj 1000 Unit/ML: INTRAMUSCULAR | Qty: 10 | Status: AC

## 2011-08-06 MED FILL — Electrolyte-R (PH 7.4) Solution: INTRAVENOUS | Qty: 5000 | Status: AC

## 2011-08-06 MED FILL — Lidocaine HCl IV Inj 20 MG/ML: INTRAVENOUS | Qty: 5 | Status: AC

## 2011-08-06 MED FILL — Sodium Chloride IV Soln 0.9%: INTRAVENOUS | Qty: 1000 | Status: AC

## 2011-08-06 MED FILL — Sodium Chloride Irrigation Soln 0.9%: Qty: 3000 | Status: AC

## 2011-08-06 MED FILL — Heparin Sodium (Porcine) Inj 1000 Unit/ML: INTRAMUSCULAR | Qty: 30 | Status: AC

## 2011-08-06 MED FILL — Mannitol IV Soln 20%: INTRAVENOUS | Qty: 500 | Status: AC

## 2011-08-06 MED FILL — Sodium Bicarbonate IV Soln 8.4%: INTRAVENOUS | Qty: 50 | Status: AC

## 2011-08-06 NOTE — Progress Notes (Signed)
2 Days Post-Op Procedure(s) (LRB): AORTIC VALVE REPLACEMENT (AVR) (N/A) MITRAL VALVE REPAIR (MVR) (N/A) Subjective: Feels better this AM, still some incisional pain, no nausea since last night  Objective: Vital signs in last 24 hours: Temp:  [97.3 F (36.3 C)-99.5 F (37.5 C)] 97.5 F (36.4 C) (04/19 0748) Pulse Rate:  [81-92] 89  (04/19 0700) Cardiac Rhythm:  [-] Atrial paced (04/19 0700) Resp:  [9-28] 21  (04/19 0600) BP: (87-116)/(53-91) 111/81 mmHg (04/19 0700) SpO2:  [94 %-100 %] 98 % (04/19 0700) Arterial Line BP: (80-130)/(52-90) 114/79 mmHg (04/19 0600) Weight:  [199 lb 15.3 oz (90.7 kg)] 199 lb 15.3 oz (90.7 kg) (04/19 0700)  Hemodynamic parameters for last 24 hours: PAP: (47-52)/(31-35) 52/35 mmHg CO:  [3.4 L/min-4.1 L/min] 4.1 L/min CI:  [1.8 L/min/m2-2.2 L/min/m2] 2.2 L/min/m2  Intake/Output from previous day: 04/18 0701 - 04/19 0700 In: 1848.6 [I.V.:1486.6; IV Piggyback:362] Out: 822 [Urine:692; Chest Tube:130] Intake/Output this shift:    General appearance: alert and no distress Heart: regular rate and rhythm Lungs: diminished breath sounds bibasilar Abdomen: less distended, still hypoactive BS  Lab Results:  Basename 08/06/11 0418 08/05/11 1613  WBC 13.3* 13.1*  HGB 12.0 11.9*  HCT 36.6 34.8*  PLT 59* 62*   BMET:  Basename 08/06/11 0418 08/05/11 1613 08/05/11 1604 08/05/11 0242  NA 135 -- 137 --  K 4.7 -- 4.6 --  CL 104 -- 106 --  CO2 21 -- -- 23  GLUCOSE 121* -- 120* --  BUN 16 -- 12 --  CREATININE 1.09 1.02 -- --  CALCIUM 8.5 -- -- 8.0*    PT/INR:  Basename 08/04/11 1643  LABPROT 20.8*  INR 1.76*   ABG    Component Value Date/Time   PHART 7.310* 08/04/2011 2254   HCO3 21.7 08/04/2011 2254   TCO2 20 08/05/2011 1604   ACIDBASEDEF 4.0* 08/04/2011 2254   O2SAT 89.0 08/04/2011 2254   CBG (last 3)   Basename 08/06/11 0400 08/06/11 0015 08/05/11 2007  GLUCAP 114* 113* 124*    Assessment/Plan: S/P Procedure(s) (LRB): AORTIC VALVE  REPLACEMENT (AVR) (N/A) MITRAL VALVE REPAIR (MVR) (N/A) POD # 2  CV- stable, BP better, wean dopamine  Avoid anticoagulation unless she has a fib RESP- CXR shows atelectasis, IS RENAL- lytes, creatinine OK- diurese Thrombocytopenia- check HIT panel, avoid heparin Mobilize   LOS: 2 days    Navayah Sok C 08/06/2011

## 2011-08-06 NOTE — Progress Notes (Signed)
@   Subjective:  Chest "sore"; no dyspnea   Objective:  Filed Vitals:   08/06/11 0445 08/06/11 0500 08/06/11 0600 08/06/11 0700  BP: 97/69 105/88  111/81  Pulse: 90 89 89 89  Temp:      TempSrc:      Resp: 20 14 21    Height:      Weight:    199 lb 15.3 oz (90.7 kg)  SpO2: 97% 97% 99% 98%    Intake/Output from previous day:  Intake/Output Summary (Last 24 hours) at 08/06/11 0746 Last data filed at 08/06/11 0600  Gross per 24 hour  Intake 1798.59 ml  Output    822 ml  Net 976.59 ml    Physical Exam: Physical exam: [name] well-nourished in no acute distress.  Skin is warm and dry.  HEENT is normal.  Neck is supple.  Chest is clear to auscultation with normal expansion anteriorly Cardiovascular exam is regular rate and rhythm. 2/6 systolic murmur LSB; no DM Abdominal exam distended Extremities show no edema. neuro grossly intact    Lab Results: Basic Metabolic Panel:  Basename 08/06/11 0418 08/05/11 1613 08/05/11 1604 08/05/11 0242  NA 135 -- 137 --  K 4.7 -- 4.6 --  CL 104 -- 106 --  CO2 21 -- -- 23  GLUCOSE 121* -- 120* --  BUN 16 -- 12 --  CREATININE 1.09 1.02 -- --  CALCIUM 8.5 -- -- 8.0*  MG -- 2.4 -- 2.4  PHOS -- -- -- --   CBC:  Basename 08/06/11 0418 08/05/11 1613  WBC 13.3* 13.1*  NEUTROABS -- --  HGB 12.0 11.9*  HCT 36.6 34.8*  MCV 94.3 92.8  PLT 59* 62*     Assessment/Plan:  1) S/P aortic root enlargement/AVR/MV repair - doing reasonably well postop; continue diuresis for postoperative volume excess. Will arrange outpatient echo 8-12 weeks following DC 2) CM - resume ACE-I and beta blocker when BP allows 3) CAD - continue ASA and statin 4) H/O VT and syncope - will need life vest at DC; will make arrangements; repeat echo 8-12 weeks; if EF< 35, will need ICD  [name] 08/06/2011, 7:46 AM

## 2011-08-06 NOTE — Progress Notes (Signed)
   CARE MANAGEMENT NOTE 08/06/2011  Patient:  [name],[name]   Account Number:  [id number]  Date Initiated:  08/06/2011  Documentation initiated by:  Oneta Sigman  Subjective/Objective Assessment:   PT S/P AVR AND MVR ON 08/04/11.  PTA, PT INDEPENDENT, LIVES ALONE.     Action/Plan:   PT STATES SHE WILL DISCHARGE HOME WITH HER AUNT AS CAREGIVER.  WILL FOLLOW FOR HOME NEEDS AS PT PROGRESSES.   Anticipated DC Date:  08/11/2011   Anticipated DC Plan:  HOME W HOME HEALTH SERVICES      DC Planning Services  CM consult      Choice offered to / List presented to:             Status of service:  In process, will continue to follow Medicare Important Message given?   (If response is "NO", the following Medicare IM given date fields will be blank) Date Medicare IM given:   Date Additional Medicare IM given:    Discharge Disposition:    Per UR Regulation:    If discussed at Long Length of Stay Meetings, dates discussed:    Comments:    Jerrell Belfast, RN, BSN Phone #[phone]

## 2011-08-06 NOTE — Progress Notes (Signed)
[facility] BRIEF SICU PROGRESS NOTE  2 Days Post-Op  S/P Procedure(s) (LRB): AORTIC VALVE REPLACEMENT (AVR) (N/A) MITRAL VALVE REPAIR (MVR) (N/A)   Stable day AAI paced BP stable but still on low dose Dopamine UOP 30 mL/hr  Plan: Continue current plan  Danira Nylander H 08/06/2011 8:03 PM

## 2011-08-07 ENCOUNTER — Inpatient Hospital Stay (HOSPITAL_COMMUNITY): Payer: Medicaid Other

## 2011-08-07 LAB — CBC
HCT: 33.7 % — ABNORMAL LOW (ref 36.0–46.0)
Hemoglobin: 11.3 g/dL — ABNORMAL LOW (ref 12.0–15.0)
MCH: 31.7 pg (ref 26.0–34.0)
MCHC: 33.5 g/dL (ref 30.0–36.0)

## 2011-08-07 LAB — BASIC METABOLIC PANEL
BUN: 19 mg/dL (ref 6–23)
Calcium: 8.4 mg/dL (ref 8.4–10.5)
GFR calc non Af Amer: >90 mL/min (ref >90)
Glucose, Bld: 104 mg/dL — ABNORMAL HIGH (ref 70–99)

## 2011-08-07 LAB — GLUCOSE, CAPILLARY: Glucose-Capillary: 99 mg/dL (ref 70–99)

## 2011-08-07 MED ORDER — ALBUTEROL SULFATE (5 MG/ML) 0.5% IN NEBU
2.5000 mg | INHALATION_SOLUTION | Freq: Three times a day (TID) | RESPIRATORY_TRACT | Status: DC
  Administered 2011-08-07 (×2): 2.5 mg via RESPIRATORY_TRACT
  Filled 2011-08-07 (×2): qty 0.5

## 2011-08-07 MED ORDER — ALBUTEROL SULFATE (5 MG/ML) 0.5% IN NEBU
2.5000 mg | INHALATION_SOLUTION | RESPIRATORY_TRACT | Status: DC | PRN
  Administered 2011-08-08: 2.5 mg via RESPIRATORY_TRACT
  Filled 2011-08-07: qty 0.5

## 2011-08-07 NOTE — Progress Notes (Signed)
[facility] BRIEF SICU PROGRESS NOTE  3 Days Post-Op  S/P Procedure(s) (LRB): AORTIC VALVE REPLACEMENT (AVR) (N/A) MITRAL VALVE REPAIR (MVR) (N/A)   Stable day BP stable off Dopamine  Plan: Continue current plan.  Will start lasix  [name] H 08/07/2011 5:42 PM

## 2011-08-07 NOTE — Progress Notes (Signed)
   CARDIOTHORACIC SURGERY PROGRESS NOTE   R3 Days Post-Op Procedure(s) (LRB): AORTIC VALVE REPLACEMENT (AVR) (N/A) MITRAL VALVE REPAIR (MVR) (N/A)  Subjective: No specific complaints.  Still moving slow.  Objective: Vital signs: BP Readings from Last 1 Encounters:  08/07/11 89/64   Pulse Readings from Last 1 Encounters:  08/07/11 90   Resp Readings from Last 1 Encounters:  08/07/11 12   Temp Readings from Last 1 Encounters:  08/07/11 97.6 F (36.4 C) Oral    Hemodynamics:    Physical Exam:  Rhythm:   sinus  Breath sounds: clear  Heart sounds:  RRR  Incisions:  Clean and dry  Abdomen:  soft  Extremities:  warm   Intake/Output from previous day: 04/19 0701 - 04/20 0700 In: 757.4 [P.O.:175; I.V.:572.4; IV Piggyback:10] Out: 1020 [Urine:1020] Intake/Output this shift: Total I/O In: 73.2 [I.V.:73.2] Out: -   Lab Results:  Basename 08/07/11 0445 08/06/11 0418  WBC 10.5 13.3*  HGB 11.3* 12.0  HCT 33.7* 36.6  PLT 61* 59*   BMET:  Basename 08/07/11 0445 08/06/11 0418  NA 137 135  K 4.4 4.7  CL 106 104  CO2 22 21  GLUCOSE 104* 121*  BUN 19 16  CREATININE 0.65 1.09  CALCIUM 8.4 8.5    CBG (last 3)   Basename 08/07/11 0808 08/06/11 1732 08/06/11 1555  GLUCAP 110* 116* 123*   ABG    Component Value Date/Time   PHART 7.310* 08/04/2011 2254   HCO3 21.7 08/04/2011 2254   TCO2 20 08/05/2011 1604   ACIDBASEDEF 4.0* 08/04/2011 2254   O2SAT 89.0 08/04/2011 2254   CXR: Mild CHF  Assessment/Plan: S/P Procedure(s) (LRB): AORTIC VALVE REPLACEMENT (AVR) (N/A) MITRAL VALVE REPAIR (MVR) (N/A)  Overall stable POD3 although still on dopamine for BP Expected post op acute blood loss anemia, mild, stable Expected post op volume excess, moderate Post op thrombocytopenia, stable   Mobilize  Wean dopamine as tolerated  Hold diuretics until BP improved   [name] 08/07/2011 10:07 AM

## 2011-08-08 ENCOUNTER — Inpatient Hospital Stay (HOSPITAL_COMMUNITY): Payer: Medicaid Other

## 2011-08-08 LAB — CBC
Hemoglobin: 10.9 g/dL — ABNORMAL LOW (ref 12.0–15.0)
MCHC: 33.9 g/dL (ref 30.0–36.0)
Platelets: 87 10*3/uL — ABNORMAL LOW (ref 150–400)
RBC: 3.48 MIL/uL — ABNORMAL LOW (ref 3.87–5.11)

## 2011-08-08 LAB — BLOOD GAS, ARTERIAL
Bicarbonate: 25.7 mEq/L — ABNORMAL HIGH (ref 20.0–24.0)
Drawn by: 252031
O2 Content: 3 L/min
Patient temperature: 98.6
TCO2: 27.5 mmol/L (ref 0–100)
TCO2: 26.8 mmol/L (ref 0–100)
pCO2 arterial: 55.5 mmHg — ABNORMAL HIGH (ref 35.0–45.0)
pH, Arterial: 7.267 — ABNORMAL LOW (ref 7.350–7.400)
pH, Arterial: 7.278 — ABNORMAL LOW (ref 7.350–7.400)

## 2011-08-08 LAB — GLUCOSE, CAPILLARY
Glucose-Capillary: 77 mg/dL (ref 70–99)
Glucose-Capillary: 106 mg/dL — ABNORMAL HIGH (ref 70–99)
Glucose-Capillary: 107 mg/dL — ABNORMAL HIGH (ref 70–99)
Glucose-Capillary: 101 mg/dL — ABNORMAL HIGH (ref 70–99)

## 2011-08-08 LAB — BASIC METABOLIC PANEL
GFR calc non Af Amer: 80 mL/min — ABNORMAL LOW (ref >90)
Glucose, Bld: 87 mg/dL (ref 70–99)
Potassium: 4.2 mEq/L (ref 3.5–5.1)
Sodium: 137 mEq/L (ref 135–145)

## 2011-08-08 MED ORDER — POTASSIUM CHLORIDE CRYS ER 20 MEQ PO TBCR
20.0000 meq | EXTENDED_RELEASE_TABLET | Freq: Every day | ORAL | Status: DC
  Administered 2011-08-08 – 2011-08-10 (×3): 20 meq via ORAL
  Filled 2011-08-08 (×3): qty 1

## 2011-08-08 MED ORDER — FUROSEMIDE 10 MG/ML IJ SOLN
40.0000 mg | Freq: Two times a day (BID) | INTRAMUSCULAR | Status: AC
  Administered 2011-08-08 (×2): 40 mg via INTRAVENOUS
  Filled 2011-08-08 (×2): qty 4

## 2011-08-08 MED ORDER — CARVEDILOL 6.25 MG PO TABS
6.2500 mg | ORAL_TABLET | Freq: Two times a day (BID) | ORAL | Status: DC
  Administered 2011-08-08 – 2011-08-13 (×9): 6.25 mg via ORAL
  Filled 2011-08-08 (×12): qty 1

## 2011-08-08 MED ORDER — INSULIN ASPART 100 UNIT/ML ~~LOC~~ SOLN: 0.0000 [IU] | Freq: Three times a day (TID) | SUBCUTANEOUS | Status: DC

## 2011-08-08 MED ORDER — FUROSEMIDE 40 MG PO TABS
40.0000 mg | ORAL_TABLET | Freq: Two times a day (BID) | ORAL | Status: DC
  Administered 2011-08-09 – 2011-08-10 (×4): 40 mg via ORAL
  Filled 2011-08-08 (×8): qty 1

## 2011-08-08 MED ORDER — SODIUM CHLORIDE 0.9 % IV SOLN: 250.0000 mL | INTRAVENOUS | Status: DC | PRN

## 2011-08-08 MED ORDER — CARVEDILOL 12.5 MG PO TABS
12.5000 mg | ORAL_TABLET | Freq: Two times a day (BID) | ORAL | Status: DC
  Filled 2011-08-08 (×2): qty 1

## 2011-08-08 MED ORDER — ASPIRIN EC 325 MG PO TBEC: 325.0000 mg | DELAYED_RELEASE_TABLET | Freq: Every day | ORAL | Status: DC

## 2011-08-08 MED ORDER — SODIUM CHLORIDE 0.9 % IJ SOLN: 3.0000 mL | INTRAMUSCULAR | Status: DC | PRN

## 2011-08-08 MED ORDER — TRAMADOL HCL 50 MG PO TABS
50.0000 mg | ORAL_TABLET | ORAL | Status: DC | PRN
  Administered 2011-08-08: 100 mg via ORAL
  Filled 2011-08-08: qty 2

## 2011-08-08 MED ORDER — METOPROLOL TARTRATE 12.5 MG HALF TABLET: 12.5000 mg | ORAL_TABLET | Freq: Two times a day (BID) | ORAL | Status: DC

## 2011-08-08 MED ORDER — MOVING RIGHT ALONG BOOK
Freq: Once | Status: AC
  Administered 2011-08-08: 11:00:00
  Filled 2011-08-08: qty 1

## 2011-08-08 MED ORDER — SODIUM CHLORIDE 0.9 % IJ SOLN
3.0000 mL | Freq: Two times a day (BID) | INTRAMUSCULAR | Status: DC
  Administered 2011-08-08 – 2011-08-13 (×11): 3 mL via INTRAVENOUS

## 2011-08-08 NOTE — Progress Notes (Signed)
ABG result in,relayed to Dr.Owen with order to transfer to Unit 2300.Marland KitchenPt was moved to unit 2300 at 2135 PM,report given to RN  at bedside . Anberlyn Feimster RN

## 2011-08-08 NOTE — Progress Notes (Addendum)
Called by primary RN to see pt abnormal resp pattern.  On arrival pt is very sleepy & drifts off to sleep easily but arouses easily to voice & f/c.  BBS cta but dim.  Pt with kussmaul resp pattern.  Pt has recvd multiple doses of pain meds during the day with the last narc dose within the last 2 hours. After observing the pt through several cycles of resp  pt maintained her O2Sats 96-100% & HR remained stable. Primary RN to contact MD to update.  Addendum: Pt with ABG results called to MD by primary RN.  Pt to transfer to ICU for closer monitoring.

## 2011-08-08 NOTE — Progress Notes (Signed)
TCTS BRIEF PROGRESS NOTE   Called because patient has become very drowsy.  She has received 40 mg of oxycodone over the course of the day.  She is arousable and follows commands but sleepy.  ABG with respiratory acidosis.  Will hold all pain medications and sedatives and return patient to ICU.  Repeat ABG in 1 hour.  Will give Narcan if she becomes any more lethargic.  Purcell Nails 08/08/2011 9:38 PM

## 2011-08-08 NOTE — Progress Notes (Signed)
08/08/2011 1700 Nursing note Patient bp 100/68. HR 77. Donielle zimmerman pa-c called for parameters for coreg. Orders received. Will continue to closely monitor patient.

## 2011-08-08 NOTE — Progress Notes (Signed)
   CARDIOTHORACIC SURGERY PROGRESS NOTE  4 Days Post-Op  S/P Procedure(s) (LRB): AORTIC VALVE REPLACEMENT (AVR) (N/A) MITRAL VALVE REPAIR (MVR) (N/A)  Subjective: Feels a little better.  Still not much of an appetite but + bowel movements yesterday.  Objective: Vital signs in last 24 hours: Temp:  [97.5 F (36.4 C)-97.9 F (36.6 C)] 97.5 F (36.4 C) (04/21 0739) Pulse Rate:  [72-89] 82  (04/21 0800) Cardiac Rhythm:  [-] Normal sinus rhythm (04/20 2030) Resp:  [10-22] 14  (04/21 0800) BP: (97-119)/(70-93) 116/93 mmHg (04/21 0800) SpO2:  [92 %-100 %] 92 % (04/21 0800) Weight:  [89.1 kg (196 lb 6.9 oz)] 89.1 kg (196 lb 6.9 oz) (04/21 0600)  Physical Exam:  Rhythm:   sinus  Breath sounds: Diminished at bases  Heart sounds:  RRR  Incisions:  Clean and dry  Abdomen:  soft  Extremities:  warm   Intake/Output from previous day: 04/20 0701 - 04/21 0700 In: 400 [I.V.:400] Out: 930 [Urine:930] Intake/Output this shift:    Lab Results:  Basename 08/08/11 0430 08/07/11 0445  WBC 12.8* 10.5  HGB 10.9* 11.3*  HCT 32.2* 33.7*  PLT 87* 61*   BMET:  Basename 08/08/11 0430 08/07/11 0445  NA 137 137  K 4.2 4.4  CL 103 106  CO2 26 22  GLUCOSE 87 104*  BUN 22 19  CREATININE 0.83 0.65  CALCIUM 8.7 8.4    CBG (last 3)   Basename 08/08/11 0737 08/07/11 1650 08/07/11 1152  GLUCAP 77 99 105*    CXR:  *RADIOLOGY REPORT*  Clinical Data: Postop cardiac surgery  PORTABLE CHEST - 1 VIEW  Comparison: 08/07/2011; 08/06/2011; 08/05/2011  Findings: Grossly unchanged enlarged cardiac silhouette and  mediastinal contours post median sternotomy and aortic and mitral  valve replacement/repair. Stable position of support apparatus.  Minimally improved aeration of the right lower lung with persistent  right mid heterogeneous and of left lower lung/retrocardiac  consolidative opacities. Unchanged small bilateral effusions.  Grossly unchanged bones.  IMPRESSION:  Minimal improved  aeration of the right mid lung with grossly  unchanged findings of small bilateral effusions and bibasilar  heterogeneous / consolidative opacities, left greater than right,  favored to represent atelectasis.  Original Report Authenticated By: Waynard Reeds, M.D.   Assessment/Plan: S/P Procedure(s) (LRB): AORTIC VALVE REPLACEMENT (AVR) (N/A) MITRAL VALVE REPAIR (MVR) (N/A)  Overall stable Expected post op acute blood loss anemia, mild, stable Expected post op volume excess, mild, diuresing Post op thrombocytopenia improved Marginal appetite   Mobilize  Diuresis  Transfer 2000   [name] H 08/08/2011 9:46 AM

## 2011-08-08 NOTE — Plan of Care (Signed)
Problem: Phase III Progression Outcomes Goal: Time patient transferred to PCTU/Telemetry POD Outcome: Completed/Met Date Met:  08/08/11 1335pm; transferred to 2013. VS stable prior and during the transfer. Pt only belongings included three balloons and black umbrella. Belongings taken to new room by patient's brother Jerilynn Som. Pt settled in chair in room 2013. Report given. Receiving RN, Judeth Cornfield, in room to receive patient. Mohannad Olivero, Charity fundraiser.

## 2011-08-08 NOTE — Progress Notes (Signed)
08/08/2011 6:56 PM Nursing note Patient has not voided since foley removal at 1300. Patient states no felt need to void. Bladder scan performed per protocol. Bladder empty per scan.  Encouraged PO intake and attempt to void within next two hours when bladder scan will be repeated per protocol. Oncoming RN updated on plan. Will continue to monitor.  Izola Teague, Blanchard Kelch

## 2011-08-08 NOTE — Progress Notes (Signed)
Pt is arousable and follows simple command but drowsy and sleepy.HR at 70s sinus,O2 sat is 98% on 2LPM/[place],RR is dropping to 10/min when  Asleep and saturation is dropping at 88 % when RR is low,B/P is 101/62Rapid response nurse called to assess the pt.Dr Cornelius Moras made aware at 9 PM with order to hold all pain meds and stat ABG,will continue to monitor. Rachel Samples RN

## 2011-08-08 NOTE — Plan of Care (Signed)
Problem: Phase III Progression Outcomes Goal: Advance to regular diet without nausea Outcome: Not Progressing Pt wants  to remain on clear liquid diet at this time; MD Cornelius Moras aware

## 2011-08-09 ENCOUNTER — Inpatient Hospital Stay (HOSPITAL_COMMUNITY): Payer: Medicaid Other

## 2011-08-09 LAB — BASIC METABOLIC PANEL
CO2: 25 mEq/L (ref 19–32)
Calcium: 9 mg/dL (ref 8.4–10.5)
Creatinine, Ser: 1.01 mg/dL (ref 0.50–1.10)

## 2011-08-09 LAB — HEPARIN INDUCED THROMBOCYTOPENIA PNL
Heparin Induced Plt Ab: NEGATIVE
UFH Low Dose 0.1 IU/mL: 0 % Release
UFH Low Dose 0.5 IU/mL: 0 % Release
UFH SRA Result: NEGATIVE

## 2011-08-09 LAB — CBC
MCH: 31.4 pg (ref 26.0–34.0)
MCV: 94.7 fL (ref 78.0–100.0)
Platelets: 133 10*3/uL — ABNORMAL LOW (ref 150–400)
RDW: 16 % — ABNORMAL HIGH (ref 11.5–15.5)

## 2011-08-09 LAB — GLUCOSE, CAPILLARY
Glucose-Capillary: 111 mg/dL — ABNORMAL HIGH (ref 70–99)
Glucose-Capillary: 84 mg/dL (ref 70–99)
Glucose-Capillary: 89 mg/dL (ref 70–99)

## 2011-08-09 MED ORDER — ALPRAZOLAM 0.25 MG PO TABS: 0.2500 mg | ORAL_TABLET | Freq: Four times a day (QID) | ORAL | Status: DC | PRN

## 2011-08-09 MED ORDER — HYDROCODONE-ACETAMINOPHEN 5-325 MG PO TABS
1.0000 | ORAL_TABLET | ORAL | Status: DC | PRN
  Administered 2011-08-09 (×2): 1 via ORAL
  Administered 2011-08-09: 2 via ORAL
  Administered 2011-08-10: 1 via ORAL
  Administered 2011-08-10 – 2011-08-13 (×17): 2 via ORAL
  Filled 2011-08-09: qty 2
  Filled 2011-08-09: qty 1
  Filled 2011-08-09 (×16): qty 2
  Filled 2011-08-09: qty 1
  Filled 2011-08-09 (×2): qty 2

## 2011-08-09 MED ORDER — SODIUM CHLORIDE 0.9 % IJ SOLN: 3.0000 mL | INTRAMUSCULAR | Status: DC | PRN

## 2011-08-09 MED ORDER — MOVING RIGHT ALONG BOOK
Freq: Once | Status: AC
  Administered 2011-08-10: 10:00:00
  Filled 2011-08-09: qty 1

## 2011-08-09 MED ORDER — MAGNESIUM HYDROXIDE 400 MG/5ML PO SUSP: 30.0000 mL | Freq: Every day | ORAL | Status: DC | PRN

## 2011-08-09 MED ORDER — SODIUM CHLORIDE 0.9 % IJ SOLN: 3.0000 mL | Freq: Two times a day (BID) | INTRAMUSCULAR | Status: DC

## 2011-08-09 MED ORDER — SODIUM CHLORIDE 0.9 % IV SOLN: 250.0000 mL | INTRAVENOUS | Status: DC | PRN

## 2011-08-09 MED ORDER — LISINOPRIL 2.5 MG PO TABS
2.5000 mg | ORAL_TABLET | Freq: Every day | ORAL | Status: DC
  Administered 2011-08-09: 2.5 mg via ORAL
  Filled 2011-08-09 (×2): qty 1

## 2011-08-09 MED ORDER — ZOLPIDEM TARTRATE 5 MG PO TABS: 5.0000 mg | ORAL_TABLET | Freq: Every evening | ORAL | Status: DC | PRN

## 2011-08-09 NOTE — Progress Notes (Signed)
@   Subjective:  Mild dyspnea.   Objective:  Filed Vitals:   08/09/11 0400 08/09/11 0414 08/09/11 0500 08/09/11 0600  BP: 127/97  118/83 124/80  Pulse: 82  79 77  Temp:  97.8 F (36.6 C)    TempSrc:  Oral    Resp: 12  11 11   Height:      Weight:    197 lb 5 oz (89.5 kg)  SpO2: 99%  98% 98%    Intake/Output from previous day:  Intake/Output Summary (Last 24 hours) at 08/09/11 0704 Last data filed at 08/09/11 0100  Gross per 24 hour  Intake    565 ml  Output   1350 ml  Net   -785 ml    Physical Exam: Physical exam: [name] well-nourished in no acute distress.  Skin is warm and dry.  HEENT is normal.  Neck is supple.  Chest with diminished BS bases Cardiovascular exam is regular rate and rhythm. 2/6 systolic murmur LSB; no DM Abdominal exam distended Extremities show no edema. neuro grossly intact    Lab Results: Basic Metabolic Panel:  Basename 08/09/11 0426 08/08/11 0430  NA 135 137  K 4.3 4.2  CL 98 103  CO2 25 26  GLUCOSE 83 87  BUN 26* 22  CREATININE 1.01 0.83  CALCIUM 9.0 8.7  MG -- --  PHOS -- --   CBC:  Basename 08/09/11 0426 08/08/11 0430  WBC 11.9* 12.8*  NEUTROABS -- --  HGB 10.7* 10.9*  HCT 32.3* 32.2*  MCV 94.7 92.5  PLT 133* 87*     Assessment/Plan:  1) S/P aortic root enlargement/AVR/MV repair - doing reasonably well postop; continue diuresis for postoperative volume excess. Will arrange outpatient echo 8-12 weeks following DC 2) CM - resume ACE-I (lisinopril 2.5 mg daily); continue coreg; increase as tolerated by BP. 3) CAD - continue ASA and statin 4) H/O VT and syncope - will need life vest at DC; will make arrangements; repeat echo 8-12 weeks; if EF< 35, will need ICD  Olga Millers 08/09/2011, 7:04 AM

## 2011-08-09 NOTE — Progress Notes (Signed)
Patient arrival to SICU, at the current time the patient is drowsy but able to follow commands. The patient at this time has Clear lung sounds, equal grips bilaterally, equal lower extremity strength, and able to answer questions appropriately. MD called at this time and updated about patient presentation, new orders obtained at this time. Will continue to monitor the patient for any more increased signs of drowsiness or confusion.

## 2011-08-09 NOTE — Progress Notes (Signed)
5 Days Post-Op Procedure(s) (LRB): AORTIC VALVE REPLACEMENT (AVR) (N/A) MITRAL VALVE REPAIR (MVR) (N/A) Subjective: Feels better this AM Moved back to ICU yesterday for oversedation/ hypercarbia + BM  Objective: Vital signs in last 24 hours: Temp:  [97.5 F (36.4 C)-98.6 F (37 C)] 97.8 F (36.6 C) (04/22 0414) Pulse Rate:  [75-91] 77  (04/22 0600) Cardiac Rhythm:  [-] Normal sinus rhythm (04/22 0600) Resp:  [10-20] 11  (04/22 0600) BP: (103-130)/(68-107) 124/80 mmHg (04/22 0600) SpO2:  [91 %-100 %] 98 % (04/22 0600) Weight:  [197 lb 5 oz (89.5 kg)] 197 lb 5 oz (89.5 kg) (04/22 0600)  Hemodynamic parameters for last 24 hours:    Intake/Output from previous day: 04/21 0701 - 04/22 0700 In: 565 [P.O.:480; I.V.:85] Out: 1350 [Urine:1350] Intake/Output this shift:    General appearance: alert and no distress Neurologic: intact Heart: regular rate and rhythm Lungs: diminished breath sounds bibasilar Wound: clean and dry  Lab Results:  Basename 08/09/11 0426 08/08/11 0430  WBC 11.9* 12.8*  HGB 10.7* 10.9*  HCT 32.3* 32.2*  PLT 133* 87*   BMET:  Basename 08/09/11 0426 08/08/11 0430  NA 135 137  K 4.3 4.2  CL 98 103  CO2 25 26  GLUCOSE 83 87  BUN 26* 22  CREATININE 1.01 0.83  CALCIUM 9.0 8.7    PT/INR: No results found for this basename: LABPROT,INR in the last 72 hours ABG    Component Value Date/Time   PHART 7.278* 08/08/2011 2313   HCO3 25.1* 08/08/2011 2313   TCO2 26.8 08/08/2011 2313   ACIDBASEDEF 0.8 08/08/2011 2313   O2SAT 98.7 08/08/2011 2313   CBG (last 3)   Basename 08/09/11 0413 08/09/11 0025 08/08/11 2042  GLUCAP 110* 89 101*    Assessment/Plan: S/P Procedure(s) (LRB): AORTIC VALVE REPLACEMENT (AVR) (N/A) MITRAL VALVE REPAIR (MVR) (N/A) Plan for transfer to step-down: see transfer orders CV- stable, rhythm stable, agree with ACE- I RESP- still with significant atelectasis on CXR, also small effusions improving RENAL- lytes, creatinine  OK Thrombocytopenia- improved   LOS: 5 days    Lindzy Rupert C 08/09/2011

## 2011-08-09 NOTE — Progress Notes (Signed)
Pt transferred to 2017 via wheelchair while in telemetry. Pt tolerated well. Report given to [facility].

## 2011-08-09 NOTE — Progress Notes (Signed)
UR Completed.  Jood Retana Jane [phone] 08/09/2011  

## 2011-08-10 LAB — GLUCOSE, CAPILLARY: Glucose-Capillary: 89 mg/dL (ref 70–99)

## 2011-08-10 LAB — BASIC METABOLIC PANEL
CO2: 27 mEq/L (ref 19–32)
Chloride: 101 mEq/L (ref 96–112)
Creatinine, Ser: 0.7 mg/dL (ref 0.50–1.10)
GFR calc Af Amer: >90 mL/min (ref >90)
Potassium: 3.9 mEq/L (ref 3.5–5.1)
Sodium: 139 mEq/L (ref 135–145)

## 2011-08-10 LAB — CBC
Platelets: 195 10*3/uL (ref 150–400)
RBC: 3.89 MIL/uL (ref 3.87–5.11)
RDW: 15.7 % — ABNORMAL HIGH (ref 11.5–15.5)
WBC: 14 10*3/uL — ABNORMAL HIGH (ref 4.0–10.5)

## 2011-08-10 MED ORDER — POTASSIUM CHLORIDE CRYS ER 20 MEQ PO TBCR
20.0000 meq | EXTENDED_RELEASE_TABLET | Freq: Two times a day (BID) | ORAL | Status: DC
  Administered 2011-08-10: 20 meq via ORAL
  Filled 2011-08-10 (×2): qty 1

## 2011-08-10 NOTE — Progress Notes (Signed)
Pt ambulated 350 ft. with RW x 1 assist, slow steady gait, pt tolerated well. Returned to chair, will continue to monitor.

## 2011-08-10 NOTE — Progress Notes (Addendum)
[address]            [place] [address]          [phone]     6 Days Post-Op  Procedure(s) (LRB): AORTIC VALVE REPLACEMENT (AVR) (N/A) MITRAL VALVE REPAIR (MVR) (N/A) Subjective: Feels weak, some sternal pain, + loose stools  Objective  Telemetry SR, frequent ectopy  Temp:  [97.6 F (36.4 C)-98.5 F (36.9 C)] 97.6 F (36.4 C) (04/22 1938) Pulse Rate:  [75-103] 87  (04/23 0332) Resp:  [13-23] 18  (04/23 0332) BP: (83-123)/(61-84) 122/84 mmHg (04/23 0332) SpO2:  [97 %-100 %] 99 % (04/23 0332) Weight:  [194 lb 8 oz (88.225 kg)] 194 lb 8 oz (88.225 kg) (04/23 0148)   Intake/Output Summary (Last 24 hours) at 08/10/11 0747 Last data filed at 08/10/11 0615  Gross per 24 hour  Intake    963 ml  Output    500 ml  Net    463 ml       General appearance: alert, cooperative and no distress Heart: regularly irregular rhythm and frequent extra-systole, 2/6 systolic murmur Lungs: diminished L>R base Abdomen: mod distension, + BS, non-tender Extremities: + LE edema Wound: incis healing well  Lab Results:  Basename 08/09/11 0426 08/08/11 0430  NA 135 137  K 4.3 4.2  CL 98 103  CO2 25 26  GLUCOSE 83 87  BUN 26* 22  CREATININE 1.01 0.83  CALCIUM 9.0 8.7  MG -- --  PHOS -- --   No results found for this basename: AST:2,ALT:2,ALKPHOS:2,BILITOT:2,PROT:2,ALBUMIN:2 in the last 72 hours No results found for this basename: LIPASE:2,AMYLASE:2 in the last 72 hours  Basename 08/09/11 0426 08/08/11 0430  WBC 11.9* 12.8*  NEUTROABS -- --  HGB 10.7* 10.9*  HCT 32.3* 32.2*  MCV 94.7 92.5  PLT 133* 87*   No results found for this basename: CKTOTAL:4,CKMB:4,TROPONINI:4 in the last 72 hours No components found with this basename: POCBNP:3 No results found for this basename: DDIMER in the last 72 hours No results found for this basename: HGBA1C in the last 72 hours No results found for this basename: CHOL,HDL,LDLCALC,TRIG,CHOLHDL in the  last 72 hours No results found for this basename: TSH,T4TOTAL,FREET3,T3FREE,THYROIDAB in the last 72 hours No results found for this basename: VITAMINB12,FOLATE,FERRITIN,TIBC,IRON,RETICCTPCT in the last 72 hours  Medications: Scheduled    . aspirin EC  325 mg Oral Daily  . bisacodyl  10 mg Oral Daily   Or  . bisacodyl  10 mg Rectal Daily  . carvedilol  6.25 mg Oral BID WC  . Chlorhexidine Gluconate Cloth  6 each Topical Daily  . docusate sodium  200 mg Oral Daily  . furosemide  40 mg Oral BID  . insulin aspart  0-24 Units Subcutaneous TID AC & HS  . lisinopril  2.5 mg Oral Daily  . loratadine  10 mg Oral Daily  . moving right along book   Does not apply Once  . mupirocin ointment  1 application Nasal BID  . pantoprazole  40 mg Oral Q1200  . potassium chloride  20 mEq Oral Daily  . simvastatin  20 mg Oral q1800  . sodium chloride  3 mL Intravenous Q12H  . DISCONTD: acetaminophen  1,000 mg Oral Q6H  . DISCONTD: sodium chloride  3 mL Intravenous Q12H     Radiology/Studies:  Dg Chest 2 View  08/09/2011  *RADIOLOGY REPORT*  Clinical Data: Shortness of breath and pleural effusions.  CHEST - 2 VIEW  Comparison: 08/08/2011  Findings: The cardiopericardial silhouette is enlarged.  The bibasilar atelectasis persists.  Tiny bilateral pleural effusions again noted. Telemetry leads overlie the chest.  IMPRESSION: No substantial change.  Cardiomegaly with bibasilar atelectasis and tiny bilateral pleural effusions.  Original Report Authenticated By: ERIC A. MANSELL, M.D.    INR: Will add last result for INR, ABG once components are confirmed Will add last 4 CBG results once components are confirmed  Assessment/Plan: S/P Procedure(s) (LRB): AORTIC VALVE REPLACEMENT (AVR) (N/A) MITRAL VALVE REPAIR (MVR) (N/A)  1. Steady progress 2. BP runs a little on low side will hold lisinopril for now, cont diuresis, may be able to increase betablocker with frequent ectopy 3. Recheck labs in am 4.  pulm toilet 5 rehab 6 d/c laxatives , stool softeners   LOS: 6 days    [name],[name] 4/23/20137:47 AM    Patient seen and examined. Agree with above.

## 2011-08-10 NOTE — Progress Notes (Signed)
CARDIAC REHAB PHASE I   PRE:  Rate/Rhythm: 82 SR  BP:  Supine:   Sitting: 90/56  Standing:    SaO2: 95 RA  MODE:  Ambulation: 350 ft   POST:  Rate/Rhythem: 92 SR  BP:  Supine:   Sitting: 94/60  Standing:    SaO2: 96 RA 1100-1140 Assisted X 1 and used walker to ambulate, Gait steady with walker, slow pace. VS stable. DOE and pt c/o of tiredness and selling in legs.Pt able to walk 350 feet. Back to recliner after walk with call light in reach.  Beatrix Fetters

## 2011-08-11 LAB — CBC
HCT: 34 % — ABNORMAL LOW (ref 36.0–46.0)
Hemoglobin: 11.4 g/dL — ABNORMAL LOW (ref 12.0–15.0)
MCHC: 33.5 g/dL (ref 30.0–36.0)
MCV: 91.6 fL (ref 78.0–100.0)
RDW: 15.8 % — ABNORMAL HIGH (ref 11.5–15.5)

## 2011-08-11 LAB — BASIC METABOLIC PANEL
BUN: 17 mg/dL (ref 6–23)
Chloride: 100 mEq/L (ref 96–112)
Creatinine, Ser: 0.71 mg/dL (ref 0.50–1.10)
GFR calc Af Amer: >90 mL/min (ref >90)
Glucose, Bld: 95 mg/dL (ref 70–99)
Potassium: 3.8 mEq/L (ref 3.5–5.1)

## 2011-08-11 MED ORDER — POTASSIUM CHLORIDE CRYS ER 20 MEQ PO TBCR
40.0000 meq | EXTENDED_RELEASE_TABLET | Freq: Two times a day (BID) | ORAL | Status: DC
  Administered 2011-08-11 – 2011-08-13 (×5): 40 meq via ORAL
  Filled 2011-08-11 (×5): qty 2

## 2011-08-11 MED ORDER — FUROSEMIDE 80 MG PO TABS
80.0000 mg | ORAL_TABLET | Freq: Two times a day (BID) | ORAL | Status: DC
  Administered 2011-08-11 – 2011-08-13 (×5): 80 mg via ORAL
  Filled 2011-08-11 (×7): qty 1

## 2011-08-11 MED ORDER — POTASSIUM CHLORIDE CRYS ER 20 MEQ PO TBCR
20.0000 meq | EXTENDED_RELEASE_TABLET | Freq: Once | ORAL | Status: AC
  Administered 2011-08-11: 20 meq via ORAL

## 2011-08-11 NOTE — Progress Notes (Addendum)
7 Days Post-Op Procedure(s) (LRB): AORTIC VALVE REPLACEMENT (AVR) (N/A) MITRAL VALVE REPAIR (MVR) (N/A)  Subjective: Patient has not had loose stools since yesterday. Only complaint is feeling tired and some shortness of breath with exertion.  Objective: Vital signs in last 24 hours: Patient Vitals for the past 24 hrs:  BP Temp Temp src Pulse Resp SpO2 Weight  08/11/11 0445 99/74 mmHg 97.6 F (36.4 C) Oral 76  18  96 % 193 lb 2 oz (87.6 kg)  08/10/11 1955 99/71 mmHg 98.1 F (36.7 C) Oral 78  16  94 % -  08/10/11 1712 106/74 mmHg - - 80  - - -  08/10/11 1428 100/70 mmHg 97.6 F (36.4 C) Oral 81  18  92 % -   Pre op weight  77.6 kg Current Weight  08/11/11 193 lb 2 oz (87.6 kg)      Intake/Output from previous day: 04/23 0701 - 04/24 0700 In: 723 [P.O.:720; I.V.:3] Out: -    Physical Exam:  Cardiovascular: RRR, no murmurs, gallops, or rubs. Pulmonary:Clear on the right. Diminished at left base; no rales, wheezes, or rhonchi. Abdomen: Soft, non tender, bowel sounds present. Extremities: Bilateral lower extremity edema. Wound: Clean and dry.  No erythema or signs of infection.  Lab Results: CBC: Basename 08/11/11 0510 08/10/11 0931  WBC 13.3* 14.0*  HGB 11.4* 12.1  HCT 34.0* 35.8*  PLT 233 195   BMET:  Basename 08/11/11 0510 08/10/11 0931  NA 139 139  K 3.8 3.9  CL 100 101  CO2 29 27  GLUCOSE 95 100*  BUN 17 19  CREATININE 0.71 0.70  CALCIUM 8.9 9.1    PT/INR: No results found for this basename: LABPROT,INR in the last 72 hours ABG:  INR: Will add last result for INR, ABG once components are confirmed Will add last 4 CBG results once components are confirmed  Assessment/Plan:  1. CV - SR/PVCs.SBP in the 90's so will continue Coreg 6.25 bid.Lisinopril on hold secondary to labile bp.Per Dr. Jens Som, will need life vest prior to discharge. 2.  Pulmonary - Encourage incentive spirometer. 3. Volume Overload - Lasix increased to 80 bid.  4.  Acute blood  loss anemia - H/H this am stable at 11.4/34. 5.Supplement Potassium. 6.Leukocytosis-WBC decreasing. Now 13,300. Remains afebrile, no signs of wound infection, and no GU complaints. 7.Pre op HGA1C 5.6.CBGs have been under 115.Will stop glucose checks and prn insulin.   ZIMMERMAN,DONIELLE MPA-C 08/11/2011   appetite remains poor but otherwise is progressing well. Will d/c EPW tomorrow if no rhythm issues Hopefully home with her brother on Friday

## 2011-08-11 NOTE — Progress Notes (Signed)
@   Subjective:  Mild dyspnea; chest pain with certain movements   Objective:  Filed Vitals:   08/10/11 1428 08/10/11 1712 08/10/11 1955 08/11/11 0445  BP: 100/70 106/74 99/71 99/74   Pulse: 81 80 78 76  Temp: 97.6 F (36.4 C)  98.1 F (36.7 C) 97.6 F (36.4 C)  TempSrc: Oral  Oral Oral  Resp: 18  16 18   Height:      Weight:    87.6 kg (193 lb 2 oz)  SpO2: 92%  94% 96%    Intake/Output from previous day:  Intake/Output Summary (Last 24 hours) at 08/11/11 0647 Last data filed at 08/10/11 2158  Gross per 24 hour  Intake    723 ml  Output      0 ml  Net    723 ml    Physical Exam: Physical exam: [name] well-nourished in no acute distress.  Skin is warm and dry.  HEENT is normal.  Neck is supple.  Chest with diminished BS bases Cardiovascular exam is regular rate and rhythm. 2/6 systolic murmur LSB; no DM Abdominal exam distended Extremities show 1+ edema. neuro grossly intact    Lab Results: Basic Metabolic Panel:  Basename 08/11/11 0510 08/10/11 0931  NA 139 139  K 3.8 3.9  CL 100 101  CO2 29 27  GLUCOSE 95 100*  BUN 17 19  CREATININE 0.71 0.70  CALCIUM 8.9 9.1  MG -- --  PHOS -- --   CBC:  Basename 08/11/11 0510 08/10/11 0931  WBC 13.3* 14.0*  NEUTROABS -- --  HGB 11.4* 12.1  HCT 34.0* 35.8*  MCV 91.6 92.0  PLT 233 195     Assessment/Plan:  1) S/P aortic root enlargement/AVR/MV repair - doing reasonably well postop; continues with postoperative volume excess. Continue lasix but increase to 80 mg po BID (home dose). Will arrange outpatient echo 8-12 weeks following DC 2) CM - continue coreg; increase as tolerated by BP. Lisinopril on hold for now due to borderline BP. 3) CAD - continue ASA and statin 4) H/O VT and syncope - will need life vest at DC; will make arrangements; repeat echo 8-12 weeks; if EF< 35, will need ICD  Olga Millers 08/11/2011, 6:47 AM

## 2011-08-11 NOTE — Progress Notes (Signed)
CARDIAC REHAB PHASE I   PRE:  Rate/Rhythm: 81 SR    BP: sitting 98/71    SaO2: 95 RA  MODE:  Ambulation: 550 ft   POST:  Rate/Rhythm: 96    BP: sitting 109/80     SaO2: 99 RA  Pt dyspneic in recliner, esp lying back. Sts she cannot recline. Breathing got easier walking with RW. Not noticeably SOB like she was when sitting. On return to room elevated feet on stool. VSS. [id number] [name] CES, ACSM

## 2011-08-12 DIAGNOSIS — I7789 Other specified disorders of arteries and arterioles: Secondary | ICD-10-CM

## 2011-08-12 DIAGNOSIS — Z9889 Other specified postprocedural states: Secondary | ICD-10-CM

## 2011-08-12 DIAGNOSIS — Z952 Presence of prosthetic heart valve: Secondary | ICD-10-CM

## 2011-08-12 LAB — BASIC METABOLIC PANEL
BUN: 15 mg/dL (ref 6–23)
CO2: 27 mEq/L (ref 19–32)
Glucose, Bld: 97 mg/dL (ref 70–99)
Potassium: 4 mEq/L (ref 3.5–5.1)
Sodium: 139 mEq/L (ref 135–145)

## 2011-08-12 MED ORDER — CARVEDILOL 6.25 MG PO TABS: 6.2500 mg | ORAL_TABLET | Freq: Two times a day (BID) | ORAL | Status: DC

## 2011-08-12 MED ORDER — HYDROCODONE-ACETAMINOPHEN 5-325 MG PO TABS: 1.0000 | ORAL_TABLET | ORAL | Status: AC | PRN

## 2011-08-12 MED ORDER — ASPIRIN 325 MG PO TBEC: 325.0000 mg | DELAYED_RELEASE_TABLET | Freq: Every day | ORAL | Status: AC

## 2011-08-12 NOTE — Discharge Summary (Signed)
Physician Discharge Summary  Patient ID: [name] MRN: [id number] DOB/AGE: 1960-10-21 51 y.o.  Admit date: 08/04/2011 Discharge date: 08/12/2011  Admission Diagnoses:  Patient Active Problem List  Diagnoses  . HYPERTENSION  . Coronary atherosclerosis of native coronary artery  . Nonischemic cardiomyopathy  . COPD (chronic obstructive pulmonary disease)  . ORTHOPNEA  . DYSPNEA  . ABDOMINAL MASS  . Valvular heart disease  . Syncope  . Aortic stenosis  . Aortic insufficiency  . Fluid overload     Discharge Diagnoses:   Patient Active Problem List  Diagnoses  . HYPERTENSION  . Coronary atherosclerosis of native coronary artery  . Nonischemic cardiomyopathy  . COPD (chronic obstructive pulmonary disease)  . ORTHOPNEA  . DYSPNEA  . ABDOMINAL MASS  . Valvular heart disease  . Syncope  . Aortic stenosis  . Aortic insufficiency  . Fluid overload  . S/P aortic valve replacement  . S/P mitral valve repair  . S/P Aortic root enlargement   Discharged Condition: good  History of Present Illness:   [name] is a 51 yo African American Female with long standing history of hypertension, cardiomyopathy, and polysubstance abuse.  The patient has been followed by [facility] however, patient has intermittent follow up.  In February 2012 the patient presented to the Emergency Department after experiencing syncopal episodes.  Workup revealed the patient to be suffering from CHF and TTE performed revealed a reduced EF of 25-30%, restrictive filling pattern, moderate aortic stenosis with moderate to severe aortic insufficiency. 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 There was moderate to severe mitral regurgitation. There was moderate left atrial enlargement, mild right ventricular and right atrial enlargement and moderate tricuspid regurgitation.  Due to these findings the patient was set up to undergo TEE which showed an EF of  25-30%, moderate aortic stenosis and moderate  to severe aortic insufficiency. There was moderate mitral regurgitation and moderate left atrial enlargement. There was moderate tricuspid regurgitation and a small pericardial effusion.  Also during that admission the patient underwent cardiac catheterization which did not reveal any significant CAD. Therefore, cardiothoracic surgery was consulted.  Dr. Dorris Fetch evaluated the patient and they were not convince the patient's aortic valve was the cause of her cardiomyopathy.  Therefore it was felt she would be best served with medical therapy.  She was discharged home with the use of a life vest.  She presented to Dr. Ludwig Clarks office on 06/25/2011 for follow-up.  At that time the patient denied any further episodes of dyspnea on exertion, orthopnea, PND, peripheral edema, syncope, chest pain or palpitations.  She also had not received any shocks from her life vest.  It was felt at that time the patient should undergo a Dobutamine stress ECHO.  If this revealed severe aortic stenosis then she would most likely need to proceed with aortic valve replacement.  This was performed and confirmed a decreased EF and severe aortic stenosis.  Her mean gradient increased from 33-54mm HG with a valve area of 0.77cm.  Due to these findings it was felt the patient would most likely require surgical intervention.  She was referred to Dr. Dorris Fetch for further evaluation.  The patient was evaluated on 07/12/2011 at which time her information was reviewed and the patient was evaluated for surgery.  It was felt the patient would be a candidate.  The risks and benefits were explained to the patient.  Due to the patients risk of non-compliance it was felt that she would not be a candidate for  a mechanical valve prosthesis.  The patient was also educated that there was a possibility that proceeding with surgery would not improve her current symptoms.  The patient agreed to the risks and was willing to proceed with surgery.    Hospital Course:   On 08/04/2011 the patient presented to [facility] and was taken to the operating room.  She underwent Mitral Valve Ring Annuloplasty utilizing a 30 mm [employer] II Ring, Aortic Valve Replacement utilizing a 23 mm [employer] Magna Ease Pericardial Tissue Valve, and Aortic Root Enlargement utilizing a Hemashield Graft.  The patient tolerated the procedure well and was taken to the ICU in stable condition.  POD #0 the patient was extubated.  POD #1 the patient continued to have hypotension.  She remained on a dopamine drip.  She was weaned off milrinone and neo as tolerated.  The patient was placed on Reglan due to gastric distention.  The patient's diuresis was increased.  The patient's mediastinal drains were removed without difficulty.  POD #2 the patient's hypotension improved.  She was weaned of Dopamine as tolerated.  The patient was suffering from Thrombocytopenia and remained off Heparin, HIT panel was sent.  Her chest xray revealed a tiny apical pneumothorax on the left.  POD #3 the patient was weaned off Dopamine.  POD #4 Patients thrombocytopenia had improved and her HIT panel was negative.  She was medically stable and transferred to the step down unit.  However, patient became very somnolent with transfer.   Her oxygen saturations continued to decrease.  ABG obtained revealed respiratory acidosis.  It was felt the patient should be transported back to the ICU for closer monitoring.  POD #5  The patient was medically stable.  Her narcotic medication dosing was decreased last evening and her somnolence had improved.  She was medically stable and transferred to the step down unit. The patients chest xray revealed small bilateral pleural effusions and atelectasis POD #6 the patient developed hypotension.  Her lisinopril was held.  She developed loose stools.  POD #8 the patient is doing well.  She remains in NSR and her EPW were removed.  We will continue to  hold the patients lisinopril for labile blood pressure.  The patient will also require life vest at discharge which is being arranged by her cardiologist.  Should no further problems arise she will be discharged home tomorrow 08/13/2011  Disposition: 01-Home or Self Care  Discharge Orders    Future Appointments: Provider: Department: Dept Phone: Center:   08/23/2011 9:15 AM Lewayne Bunting, MD [facility] [phone] [id number]     Medication List  As of 08/12/2011  8:38 AM   STOP taking these medications         nitroGLYCERIN 0.4 MG SL tablet         TAKE these medications         aspirin 325 MG EC tablet   Take 1 tablet (325 mg total) by mouth daily.      carvedilol 6.25 MG tablet   Commonly known as: COREG   Take 1 tablet (6.25 mg total) by mouth 2 (two) times daily with a meal.      cetirizine 10 MG tablet   Commonly known as: ZYRTEC   Take 10 mg by mouth daily.      furosemide 40 MG tablet   Commonly known as: LASIX   Take 80 mg by mouth 2 (two) times daily.      HYDROcodone-acetaminophen  5-325 MG per tablet   Commonly known as: NORCO   Take 1-2 tablets by mouth every 4 (four) hours as needed.      lisinopril 2.5 MG tablet   Commonly known as: PRINIVIL,ZESTRIL   Take 2.5 mg by mouth daily.      potassium chloride SA 20 MEQ tablet   Commonly known as: K-DUR,KLOR-CON   Take 20 mEq by mouth 2 (two) times daily.      pravastatin 40 MG tablet   Commonly known as: PRAVACHOL   Take 40 mg by mouth every evening.           Follow-up Information    Follow up with Renaud Celli C, MD in 3 weeks.   Contact information:   [address] [place] [address] [phone]       Follow up with [facility] in 3 weeks. (Please get chest xray performed 1 hour prior to appointment with Dr. Dorris Fetch)    Contact information:   [address], [address]       Follow up with Olga Millers, MD. Schedule an appointment  as soon as possible for a visit in 2 weeks.   Contact information:   [address] [place], [address] [place] [address] [phone]          Signed: Lowella Dandy 08/12/2011, 8:38 AM  Patient not a candidate for coumadin given history of noncomplianace and substance abuse

## 2011-08-12 NOTE — Progress Notes (Signed)
@   Subjective:  Dyspnea improved; no chest pain   Objective:  Filed Vitals:   08/11/11 0445 08/11/11 1515 08/11/11 2029 08/12/11 0518  BP: 99/74 98/73 103/75 130/81  Pulse: 76 83 86 84  Temp: 97.6 F (36.4 C)  97.7 F (36.5 C) 97.6 F (36.4 C)  TempSrc: Oral  Oral Oral  Resp: 18 18 18 19   Height:      Weight: 87.6 kg (193 lb 2 oz)   86.7 kg (191 lb 2.2 oz)  SpO2: 96% 92% 99% 96%    Intake/Output from previous day:  Intake/Output Summary (Last 24 hours) at 08/12/11 0710 Last data filed at 08/11/11 2105  Gross per 24 hour  Intake    366 ml  Output      0 ml  Net    366 ml    Physical Exam: Physical exam: [name] well-nourished in no acute distress.  Skin is warm and dry.  HEENT is normal.  Neck is supple.  Chest with diminished BS bases Cardiovascular exam is regular rate and rhythm. 2/6 systolic murmur LSB; no DM Abdominal exam mildly distended Extremities show 1-2 + edema. neuro grossly intact    Lab Results: Basic Metabolic Panel:  Basename 08/11/11 0510 08/10/11 0931  NA 139 139  K 3.8 3.9  CL 100 101  CO2 29 27  GLUCOSE 95 100*  BUN 17 19  CREATININE 0.71 0.70  CALCIUM 8.9 9.1  MG -- --  PHOS -- --   CBC:  Basename 08/11/11 0510 08/10/11 0931  WBC 13.3* 14.0*  NEUTROABS -- --  HGB 11.4* 12.1  HCT 34.0* 35.8*  MCV 91.6 92.0  PLT 233 195     Assessment/Plan:  1) S/P aortic root enlargement/AVR/MV repair - doing reasonably well postop; continues with postoperative volume excess. Continue lasix 80 mg po BID (home dose). Will arrange outpatient echo 8-12 weeks following DC 2) CM - continue coreg; increase as tolerated by BP. Lisinopril on hold for now due to borderline BP. Will resume when able. 3) CAD - continue ASA and statin 4) H/O VT and syncope - will need life vest at DC; will make arrangements; repeat echo 8-12 weeks; if EF< 35, will need ICD  Olga Millers 08/12/2011, 7:10 AM

## 2011-08-12 NOTE — Progress Notes (Signed)
D/C EPW per protocol and as ordered, all ends intact, no ectopy noted, pt tolerated well, VSS.  Will continue to monitor.

## 2011-08-12 NOTE — Progress Notes (Signed)
Pt ambulated 550 ft with RW, slow steady gait. Returned to chair, call bell within reach.  Will continue to monitor.

## 2011-08-12 NOTE — Progress Notes (Signed)
CARDIAC REHAB PHASE I   PRE:  Rate/Rhythm: 87SR  BP:  Supine:   Sitting: 95/75  Standing:    SaO2: 97%RA  MODE:  Ambulation: 550 ft   POST:  Rate/Rhythem: 96SR  BP:  Supine:   Sitting: 112/68  Standing:    SaO2: 100%RA 1355-1412 Pt walked 550 ft on RA with rolling walker with steady gait. Tolerated well. Did not feel that she could increase distance. To sitting on side of bed after walk. Sats good on RA.  Duanne Limerick

## 2011-08-12 NOTE — Discharge Instructions (Signed)
Aortic Valve Replacement Care After Read the instructions outlined below and refer to this sheet for the next few weeks. These discharge instructions provide you with general information on caring for yourself after you leave the hospital. Your surgeon may also give you specific instructions. While your treatment has been planned according to the most current medical practices available, unavoidable complications occasionally occur. If you have any problems or questions after discharge, please call your surgeon. AFTER THE PROCEDURE  Full recovery from heart valve surgery can take several months.   Blood thinning (anticoagulation) treatment with warfarin is often prescribed for 6 weeks to 3 months after surgery for those with biological valves. It is prescribed for life for those with mechanical valves.   Recovery includes healing of the surgical incision. There is a gradual building of stamina and exercise abilities. An exercise program under the direction of a physical therapist may be recommended.   Once you have an artificial valve, your heart function and your life will return to normal. You usually feel better after surgery. Shortness of breath and fatigue should lessen. If your heart was already severely damaged before your surgery, you may continue to have problems.   You can usually resume most of your normal activities. You will have to continue to monitor your condition. You need to watch out for blood clots and infections.   Artificial valves need to be replaced after a period of time. It is important that you see your caregiver regularly.   Some individuals with an aortic valve replacement need to take antibiotics before having dental work or other surgical procedures. This is called prophylactic antibiotic treatment. These drugs help to prevent infective endocarditis. Antibiotics are only recommended for individuals with the highest risk for developing infective endocarditis. Let your  dentist and your caregiver know if you have a history of any of the following so that the necessary precautions can be taken:   A VSD.   A repaired VSD.   Endocarditis in the past.   An artificial (prosthetic) heart valve.  HOME CARE INSTRUCTIONS   Use all medications as prescribed.   Take your temperature every morning for the first week after surgery. Record these.   Weigh yourself every morning for at least the first week after surgery and record.   Do not lift more than 10 pounds (4.5 kg) until your breastbone (sternum) has healed. Avoid all activities which would place strain on your incision.   You may shower as soon as directed by your caregiver after surgery. Pat incisions dry. Do not rub incisions with washcloth or towel.   Avoid driving for 4 to 6 weeks following surgery or as instructed.   Use your elastic stockings during the day. You should wear the stockings for at least 2 weeks after discharge or longer if your ankles are swollen. The stockings help blood flow and help reduce swelling in the legs. It is easiest to put the stockings on before you get out of bed in the morning. They should fit snugly.  Pain Control  If a prescription was given for a pain reliever, please follow your doctor's directions.   If the pain is not relieved by your medicine, becomes worse, or you have difficulty breathing, call your surgeon.  Activity  Take frequent rest periods throughout the day.   Wait one week before returning to strenuous activities such as heavy lifting (more than 10 pounds), pushing or pulling.   Talk with your doctor about when you may   return to work and your exercise routine.   Do not drive while taking prescription pain medication.  Nutrition  You may resume your normal diet.   Drink plenty of fluids (6-8 glasses a day).   Eat a well-balanced diet.   Call your caregiver for persistent nausea or vomiting.  Elimination Your normal bowel function should  return. If constipation should occur, you may:  Take a mild laxative.   Add fruit and bran to your diet.   Drink more fluids.   Call your doctor if constipation is not relieved.  SEEK IMMEDIATE MEDICAL CARE IF:   You develop chest pain which is not coming from your surgical cut (incision).   You develop shortness of breath or have difficulty breathing.   You develop a temperature over 101 F (38.3 C).   You have a sudden weight gain. Let your caregiver know what the weight gain is.   You develop a rash.   You develop any reaction or side effects to medications given.   You have increased bleeding from wounds.   You see redness, swelling, or have increasing pain in wounds.   You have pus coming from your wound.   You develop lightheadedness or feel faint.  Document Released: 10/22/2004 Document Revised: 03/25/2011 Document Reviewed: 01/13/2005 Exit[employer] Patient Information 2012 [employer]. 

## 2011-08-12 NOTE — Progress Notes (Addendum)
8 Days Post-Op Procedure(s) (LRB): AORTIC VALVE REPLACEMENT (AVR) (N/A) MITRAL VALVE REPAIR (MVR) (N/A) Subjective:  Ms. Marton has no new complaints this morning.  She ambulated the entire unit this morning with the assist of a FWW without difficulty.    Objective: Vital signs in last 24 hours: Temp:  [97.6 F (36.4 C)-97.7 F (36.5 C)] 97.6 F (36.4 C) (04/25 0518) Pulse Rate:  [83-86] 84  (04/25 0518) Cardiac Rhythm:  [-] Normal sinus rhythm (04/24 1915) Resp:  [18-19] 19  (04/25 0518) BP: (98-130)/(73-81) 130/81 mmHg (04/25 0518) SpO2:  [92 %-99 %] 96 % (04/25 0518) Weight:  [191 lb 2.2 oz (86.7 kg)] 191 lb 2.2 oz (86.7 kg) (04/25 0518)  Intake/Output from previous day: 04/24 0701 - 04/25 0700 In: 366 [P.O.:360; I.V.:6] Out: -   General appearance: alert, cooperative and no distress Heart: regular rate and rhythm Lungs: clear to auscultation bilaterally Abdomen: soft, non-tender; bowel sounds normal; no masses,  no organomegaly Extremities: edema 1+ Wound: clean and dry  Lab Results:  Basename 08/11/11 0510 08/10/11 0931  WBC 13.3* 14.0*  HGB 11.4* 12.1  HCT 34.0* 35.8*  PLT 233 195   BMET:  Basename 08/11/11 0510 08/10/11 0931  NA 139 139  K 3.8 3.9  CL 100 101  CO2 29 27  GLUCOSE 95 100*  BUN 17 19  CREATININE 0.71 0.70  CALCIUM 8.9 9.1    PT/INR: No results found for this basename: LABPROT,INR in the last 72 hours ABG    Component Value Date/Time   PHART 7.278* 08/08/2011 2313   HCO3 25.1* 08/08/2011 2313   TCO2 26.8 08/08/2011 2313   ACIDBASEDEF 0.8 08/08/2011 2313   O2SAT 98.7 08/08/2011 2313   CBG (last 3)   Basename 08/11/11 0547 08/10/11 2144 08/10/11 1630  GLUCAP 106* 95 102*    Assessment/Plan: S/P Procedure(s) (LRB): AORTIC VALVE REPLACEMENT (AVR) (N/A) MITRAL VALVE REPAIR (MVR) (N/A) Enlargement of Aortic Root  2. CV- NSR, rate and blood pressure controlled, will continue Coreg, will hold lisinopril due to labile pressure 3. Resp- no  dyspnea, continue IS 4. Fluid Balance- patient with good UO/ weight is up 10kg since admission, will continue Lasix 5. AM Labs pending 6. Dispo- will d/c EPW, patient doing well, will need LifeVest at d/c, cardiology making arrangements, will hopefully d/c tomorrow   LOS: 8 days    Lowella Dandy 08/12/2011   Patient seen and examined. Agree with above A & P

## 2011-08-13 ENCOUNTER — Other Ambulatory Visit: Payer: Self-pay | Admitting: Cardiology

## 2011-08-13 LAB — BASIC METABOLIC PANEL
BUN: 14 mg/dL (ref 6–23)
CO2: 28 mEq/L (ref 19–32)
Chloride: 99 mEq/L (ref 96–112)
Creatinine, Ser: 0.71 mg/dL (ref 0.50–1.10)
Glucose, Bld: 99 mg/dL (ref 70–99)
Potassium: 4.4 mEq/L (ref 3.5–5.1)

## 2011-08-13 MED ORDER — FUROSEMIDE 10 MG/ML IJ SOLN
80.0000 mg | Freq: Two times a day (BID) | INTRAMUSCULAR | Status: DC
  Filled 2011-08-13 (×2): qty 8

## 2011-08-13 NOTE — Progress Notes (Signed)
Notified PA Gershon Crane regarding 9 beats run of wide complex tachycardia. Will continue to monitor. Pt still ok to go home after Life Vest. Sutures DC'd per hospital protocol and as ordered.

## 2011-08-13 NOTE — Progress Notes (Signed)
CARDIAC REHAB PHASE I   PRE:  Rate/Rhythm: 77 SR  BP:  Supine:   Sitting: 95/71  Standing:    SaO2: 987 RA  MODE:  Ambulation: 700 ft   POST:  Rate/Rhythem: 92 SR  BP:  Supine:   Sitting: 114/82  Standing:    SaO2: 100 RA 1100-1200  Assisted X1 and used walker to ambulate. Gait steady with walker, some DOE. RA sat after walk 100%.Completed discharge education with pr. She agrees to [employer]. CRP in  [place], will send referral. Pt need to be refitted with lifevest, able to reach Dorothea Ogle, she is sending Tresa Endo to see pt. Pt needs walker for home use.  Beatrix Fetters

## 2011-08-13 NOTE — Progress Notes (Signed)
@   Subjective:  Denies dyspnea or chest pain   Objective:  Filed Vitals:   08/12/11 1225 08/12/11 1406 08/12/11 1958 08/13/11 0403  BP: 112/76 112/68 100/73 113/85  Pulse: 92 91 84 80  Temp:   98.8 F (37.1 C) 98.3 F (36.8 C)  TempSrc:   Oral Oral  Resp:  19 16 17   Height:      Weight:    86.4 kg (190 lb 7.6 oz)  SpO2:  100% 98% 94%    Intake/Output from previous day:  Intake/Output Summary (Last 24 hours) at 08/13/11 0809 Last data filed at 08/12/11 1230  Gross per 24 hour  Intake    123 ml  Output      0 ml  Net    123 ml    Physical Exam: Physical exam: [name] well-nourished in no acute distress.  Skin is warm and dry.  HEENT is normal.  Neck is supple.  Chest with diminished BS bases Cardiovascular exam is regular rate and rhythm. 2/6 systolic murmur LSB; no DM Abdominal exam mildly distended Extremities show 2-3 + edema. neuro grossly intact    Lab Results: Basic Metabolic Panel:  Basename 08/13/11 0540 08/12/11 0640  NA 139 139  K 4.4 4.0  CL 99 101  CO2 28 27  GLUCOSE 99 97  BUN 14 15  CREATININE 0.71 0.61  CALCIUM 9.0 8.8  MG -- --  PHOS -- --   CBC:  Basename 08/11/11 0510 08/10/11 0931  WBC 13.3* 14.0*  NEUTROABS -- --  HGB 11.4* 12.1  HCT 34.0* 35.8*  MCV 91.6 92.0  PLT 233 195     Assessment/Plan:  1) S/P aortic root enlargement/AVR/MV repair - doing reasonably well postop; Patient remains markedly volume overloaded; change lasix to 80 mg IV BID; follow renal function; would favor 1-2 days of further diuresis prior to DC; Will discuss with Dr Dorris Fetch. Will arrange outpatient echo 8-12 weeks following DC 2) CM - continue coreg; increase as tolerated by BP. Lisinopril on hold for now due to borderline BP. Will resume when able. 3) CAD - continue ASA and statin 4) H/O VT and syncope - will need life vest at DC (her brother has this at home and can bring at time of DC);  repeat echo 8-12 weeks; if EF< 35, will need  ICD  Olga Millers 08/13/2011, 8:09 AM

## 2011-08-13 NOTE — Progress Notes (Signed)
Pt. Discharged 08/13/2011  5:41 PM Discharge instructions reviewed with patient/family. Patient/family verbalized understanding. All Rx's given. Questions answered as needed. Pt. Discharged to home with family/self.  [name]

## 2011-08-13 NOTE — Progress Notes (Addendum)
9 Days Post-Op  Procedure(s) (LRB): AORTIC VALVE REPLACEMENT (AVR) (N/A) MITRAL VALVE REPAIR (MVR) (N/A) Subjective: Feels ok, + BM yesterday, ambulation and strength improving  Objective  Telemetry SR, some PVC's  Temp:  [98.3 F (36.8 C)-98.8 F (37.1 C)] 98.3 F (36.8 C) (04/26 0403) Pulse Rate:  [80-96] 80  (04/26 0403) Resp:  [16-19] 17  (04/26 0403) BP: (100-122)/(68-85) 113/85 mmHg (04/26 0403) SpO2:  [94 %-100 %] 94 % (04/26 0403) Weight:  [190 lb 7.6 oz (86.4 kg)] 190 lb 7.6 oz (86.4 kg) (04/26 0403)   Intake/Output Summary (Last 24 hours) at 08/13/11 0739 Last data filed at 08/12/11 1230  Gross per 24 hour  Intake    243 ml  Output      0 ml  Net    243 ml       General appearance: alert, cooperative and no distress Heart: regular rate and rhythm, S1, S2 normal and soft systolic murmur Lungs: dimiished in bases L>R Abdomen: soft, nontender, mild distension, + BS Extremities: + BLE edema Wound: incisions healing well  Lab Results:  Basename 08/13/11 0540 08/12/11 0640  NA 139 139  K 4.4 4.0  CL 99 101  CO2 28 27  GLUCOSE 99 97  BUN 14 15  CREATININE 0.71 0.61  CALCIUM 9.0 8.8  MG -- --  PHOS -- --   No results found for this basename: AST:2,ALT:2,ALKPHOS:2,BILITOT:2,PROT:2,ALBUMIN:2 in the last 72 hours No results found for this basename: LIPASE:2,AMYLASE:2 in the last 72 hours  Basename 08/11/11 0510 08/10/11 0931  WBC 13.3* 14.0*  NEUTROABS -- --  HGB 11.4* 12.1  HCT 34.0* 35.8*  MCV 91.6 92.0  PLT 233 195   No results found for this basename: CKTOTAL:4,CKMB:4,TROPONINI:4 in the last 72 hours No components found with this basename: POCBNP:3 No results found for this basename: DDIMER in the last 72 hours No results found for this basename: HGBA1C in the last 72 hours No results found for this basename: CHOL,HDL,LDLCALC,TRIG,CHOLHDL in the last 72 hours No results found for this basename: TSH,T4TOTAL,FREET3,T3FREE,THYROIDAB in the last 72  hours No results found for this basename: VITAMINB12,FOLATE,FERRITIN,TIBC,IRON,RETICCTPCT in the last 72 hours  Medications: Scheduled    . aspirin EC  325 mg Oral Daily  . carvedilol  6.25 mg Oral BID WC  . furosemide  80 mg Oral BID  . loratadine  10 mg Oral Daily  . pantoprazole  40 mg Oral Q1200  . potassium chloride  40 mEq Oral BID  . simvastatin  20 mg Oral q1800  . sodium chloride  3 mL Intravenous Q12H     Radiology/Studies:  No results found.  INR: Will add last result for INR, ABG once components are confirmed Will add last 4 CBG results once components are confirmed  Assessment/Plan: S/P Procedure(s) (LRB): AORTIC VALVE REPLACEMENT (AVR) (N/A) MITRAL VALVE REPAIR (MVR) (N/A) 1. Steady improvement, appears ready for d/c if lifevest arrangements made   LOS: 9 days    [name] 4/26/20137:39 AM  Patient seen and examined. Agree with above

## 2011-08-16 NOTE — Progress Notes (Signed)
   CARE MANAGEMENT NOTE 08/16/2011  Patient:  [name],[name]   Account Number:  [id number]  Date Initiated:  08/06/2011  Documentation initiated by:  Samiksha Pellicano  Subjective/Objective Assessment:   PT S/P AVR AND MVR ON 08/04/11.  PTA, PT INDEPENDENT, LIVES ALONE.     Action/Plan:   PT STATES SHE WILL DISCHARGE HOME WITH HER AUNT AS CAREGIVER.  WILL FOLLOW FOR HOME NEEDS AS PT PROGRESSES.   Anticipated DC Date:  08/11/2011   Anticipated DC Plan:  HOME W HOME HEALTH SERVICES      DC Planning Services  CM consult      [facility] Choice  HOME HEALTH   Choice offered to / List presented to:  C-1 Patient   DME arranged  Levan Hurst      DME agency  [facility]     [facility] arranged  HH-1 RN      [facility] agency  [facility]   Status of service:  Completed, signed off Medicare Important Message given?   (If response is "NO", the following Medicare IM given date fields will be blank) Date Medicare IM given:   Date Additional Medicare IM given:    Discharge Disposition:  HOME W HOME HEALTH SERVICES  Per UR Regulation:  Reviewed for med. necessity/level of care/duration of stay  If discussed at Long Length of Stay Meetings, dates discussed:    Comments:  08/13/11 Roye Gustafson,RN,BSN 1600 PT FOR DISCHARGE HOME TODAY.  PT FEELS SHE NEEDS RW FOR HOME; WOULD LIKELY BENEFIT FROM [facility] FOR RESTORATIVE CARE. WILL ARRANGE HH SERVICES FOR DC.  REFERRAL TO [facility], PER CHOICE.  START OF CARE 24-48H POST DC DATE.  ZOLL REPRESENTATIVE TO VISIT PT PRIOR TO DC TO PROVIDE ADJUSTMENT/REFITTING OF LIFEVEST.

## 2011-08-19 ENCOUNTER — Other Ambulatory Visit: Payer: Self-pay | Admitting: Physician Assistant

## 2011-08-23 ENCOUNTER — Ambulatory Visit (INDEPENDENT_AMBULATORY_CARE_PROVIDER_SITE_OTHER): Payer: Self-pay | Admitting: Cardiology

## 2011-08-23 ENCOUNTER — Encounter: Payer: Self-pay | Admitting: Cardiology

## 2011-08-23 VITALS — BP 86/50 | HR 98 | Ht 65.0 in | Wt 169.0 lb

## 2011-08-23 DIAGNOSIS — Z954 Presence of other heart-valve replacement: Secondary | ICD-10-CM

## 2011-08-23 DIAGNOSIS — I359 Nonrheumatic aortic valve disorder, unspecified: Secondary | ICD-10-CM

## 2011-08-23 DIAGNOSIS — I2510 Atherosclerotic heart disease of native coronary artery without angina pectoris: Secondary | ICD-10-CM

## 2011-08-23 DIAGNOSIS — I428 Other cardiomyopathies: Secondary | ICD-10-CM

## 2011-08-23 DIAGNOSIS — E8779 Other fluid overload: Secondary | ICD-10-CM

## 2011-08-23 DIAGNOSIS — R55 Syncope and collapse: Secondary | ICD-10-CM

## 2011-08-23 DIAGNOSIS — Z952 Presence of prosthetic heart valve: Secondary | ICD-10-CM

## 2011-08-23 LAB — BASIC METABOLIC PANEL
BUN: 17 mg/dL (ref 6–23)
CO2: 27 mEq/L (ref 19–32)
Chloride: 101 mEq/L (ref 96–112)
Glucose, Bld: 104 mg/dL — ABNORMAL HIGH (ref 70–99)
Potassium: 4.3 mEq/L (ref 3.5–5.1)
Sodium: 139 mEq/L (ref 135–145)

## 2011-08-23 MED ORDER — HYDROCODONE-ACETAMINOPHEN 5-325 MG PO TABS: 2.0000 | ORAL_TABLET | ORAL | Status: AC | PRN

## 2011-08-23 NOTE — Assessment & Plan Note (Signed)
Plan continue SBE prophylaxis. 

## 2011-08-23 NOTE — Assessment & Plan Note (Signed)
History of syncope and nonsustained ventricular tachycardia in the setting of reduced LV function. Repeat echocardiogram in 3 months.

## 2011-08-23 NOTE — Assessment & Plan Note (Signed)
Continue aspirin and statin. 

## 2011-08-23 NOTE — Patient Instructions (Signed)
Your physician recommends that you schedule a follow-up appointment in: 4 WEEKS  Your physician has requested that you have an echocardiogram. Echocardiography is a painless test that uses sound waves to create images of your heart. It provides your doctor with information about the size and shape of your heart and how well your heart's chambers and valves are working. This procedure takes approximately one hour. There are no restrictions for this procedure.SCHEDULE LATE July  Your physician recommends that you HAVE LAB WORK TODAY

## 2011-08-23 NOTE — Assessment & Plan Note (Signed)
Continue beta blocker. I will not add an ACE inhibitor today as her heart pressure is borderline. Recheck showed a systolic of approximately 100. We will add this later. Continue life vest. Repeat echocardiogram in 3 months. Hopefully LV function will have improved. If not she will need an ICD.

## 2011-08-23 NOTE — Assessment & Plan Note (Signed)
Continue present dose of Lasix. Check potassium and renal function. 

## 2011-08-23 NOTE — Progress Notes (Signed)
HPI: [name] is a pleasant female who returns for followup of aortic stenosis and CAD. The patient has a long history of severe hypertension and a cardiomyopathy. Her previous workup in 2005 suggested the possibility of amyloid. At that time, she had a normal ferritin level, normal ACE level, normal TSH, and negative HIV. A fat pad biopsy to rule out amyloidosis was negative. However, we also scheduled her to have free kappa light chains which were elevated at 4.16 and her lambda free light chains were 0.97 which was normal. However, her kappa to lambda free light chain ratio was elevated at 4.29. She was seen by hematology oncology in September of 2010 for question amyloidosis. Apparently W/U negative. Patient admitted with congestive heart failure and URI as well as syncope in February 2013. Repeat echocardiogram showed an ejection fraction of 20-25%, restrictive filling pattern, moderate aortic stenosis with moderate to severe aortic insufficiency. 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 There was moderate to severe mitral regurgitation. There was moderate left atrial enlargement, mild right ventricular and right atrial enlargement and moderate tricuspid regurgitation. Transesophageal echocardiogram showed an ejection fraction of 25-30%, moderate aortic stenosis and moderate to severe aortic insufficiency. There was moderate mitral regurgitation and moderate left atrial enlargement. There was moderate tricuspid regurgitation and a small pericardial effusion. Cardiac catheterization in February of 2012 showed a 70% first diagonal, a 50% first marginal, a 50-60% left circumflex and a 20% right coronary artery. Ejection fraction was 20%. There was mild mitral regurgitation and severe aortic insufficiency. There was mild pulmonary hypertension. Abdominal CT showed fibroids. Dobutamine echocardiogram in March of 2013 showed depressed LV function but severe aortic stenosis. Her mean gradient increased from  33-39 mm of mercury. Her aortic valve area at peak infusion was 0.77 cm. Patient had AVR 23mm Edwards Magna Ease Pericardial Valve Conduit  Aortic Root Enlargement utilizing a Hemashield Graft  MITRAL VALVE Ring Annuloplasty utilizing 30mm Edwards Physio II Ring in April of 2013. She was [facility] with a life vest. Since DC, her dyspnea on exertion has improved. No orthopnea, PND. Her pedal edema has improved since discharge. No chest pain, palpitations or syncope. No fever.    Current Outpatient Prescriptions  Medication Sig Dispense Refill  . carvedilol (COREG) 6.25 MG tablet Take 1 tablet (6.25 mg total) by mouth 2 (two) times daily with a meal.  60 tablet  1  . cetirizine (ZYRTEC) 10 MG tablet Take 10 mg by mouth daily.      . furosemide (LASIX) 40 MG tablet Take 80 mg by mouth 2 (two) times daily.      Marland Kitchen KLOR-CON M20 20 MEQ tablet TAKE ONE TABLET BY MOUTH TWICE DAILY  60 each  12  . pravastatin (PRAVACHOL) 40 MG tablet Take 40 mg by mouth every evening.      Marland Kitchen aspirin EC 325 MG EC tablet Take 1 tablet (325 mg total) by mouth daily.  30 tablet    . HYDROcodone-acetaminophen (NORCO) 5-325 MG per tablet Take 1-2 tablets by mouth every 4 (four) hours as needed.  50 tablet  0  . DISCONTD: budesonide-formoterol (SYMBICORT) 160-4.5 MCG/ACT inhaler Inhale 2 puffs into the lungs 2 (two) times daily.  1 Inhaler  0  . DISCONTD: KLOR-CON M20 20 MEQ tablet TAKE ONE TABLET BY MOUTH TWICE DAILY  60 each  12  . DISCONTD: tiotropium (SPIRIVA) 18 MCG inhalation capsule Place 1 capsule (18 mcg total) into inhaler and inhale daily.  30 capsule  0  Past Medical History  Diagnosis Date  . CHF (congestive heart failure)   . Pulmonary nodule, left     f/u chest CT 2/12: interval clearing of RUL air space nodule, borderline enlarged mediastinal lymph nodes stable, pul. arterial HTN  . Emphysema   . Ovarian cyst     ovarian cystic mass  . Cocaine abuse   . Alcohol abuse   . Hypertension     pulmonary  .  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 Mitral stenosis   . Nonischemic cardiomyopathy     a. cath 6/08: pLAD 30%, mLAD 30%, oCFX 40%, dCVS 40%; OM 50%; dRCA 40%;    b. echo 06/01/10: severe LVH; EF 45-50%; inf HK; mild to mod AS (mean 30), mod. AI; mild to mod MS; mod MR; severe LAE, mod TR, mod PR, PASP 59;  c.  w/u for Amyloid done in past  . Tubal pregnancy     Hx  . Cannabis abuse   . Fibroids     abd. CT 2/12: large fibroid uterus  . Personal history of noncompliance with medical treatment, presenting hazards to health   . Pulmonary HTN   . COPD (chronic obstructive pulmonary disease) 12/27/2008        . Anxiety   . Ventricular tachycardia     Past Surgical History  Procedure Date  . Tubal ligation   . Cardiac catheterization 2008  . Tee without cardioversion 06/08/2011    Procedure: TRANSESOPHAGEAL ECHOCARDIOGRAM (TEE);  Surgeon: Lewayne Bunting, MD;  Location[facility] ENDOSCOPY;  Service: Cardiovascular;  Laterality: N/A;  . Tonsillectomy   . Aortic valve replacement 08/04/2011    Procedure: AORTIC VALVE REPLACEMENT (AVR);  Surgeon: Loreli Slot, MD;  Location: [facility];  Service: Open Heart Surgery;  Laterality: N/A;  WITH ROOT ENLARGEMENT  . Mitral valve repair 08/04/2011    Procedure: MITRAL VALVE REPAIR (MVR);  Surgeon: Loreli Slot, MD;  Location: [facility] OR;  Service: Open Heart Surgery;  Laterality: N/A;    History   Social History  . Marital Status: Single    Spouse Name: N/A    Number of Children: N/A  . Years of Education: N/A   Occupational History  . Not on file.   Social History Main Topics  . Smoking status: Former Smoker -- 1.0 packs/day for 10 years    Types: Cigarettes    Quit date: 05/27/2010  . Smokeless tobacco: Former Neurosurgeon    Quit date: 05/27/2010  . Alcohol Use: 0.6 oz/week    1 Shots of liquor per week     use varies  . Drug Use: No  . Sexually Active: Yes   Other Topics Concern  . Not on file   Social History  Narrative   Single no childrenTobacco Use - Hx of x-20 yearsHistory Cocaine and Mariguana use    ROS: Bilateral foot pain but no fevers or chills, productive cough, hemoptysis, dysphasia, odynophagia, melena, hematochezia, dysuria, hematuria, rash, seizure activity, orthopnea, PND, claudication. Remaining systems are negative.  Physical Exam: [name] well-nourished in no acute distress.  Skin is warm and dry.  HEENT is normal.  Neck is supple.  Chest is clear to auscultation with normal expansion. Sternotomy without evidence of infection. Cardiovascular exam is regular rate and rhythm. 2/6 systolic murmur left sternal border. No diastolic murmur. Abdominal exam nontender or distended. No masses palpated. Extremities show 1+ ankle edema. neuro grossly intact  ECG sinus rhythm at a rate of 94. Left  axis deviation. Occasional PVC. Nonspecific T-wave changes. Cannot rule out prior lateral infarct.

## 2011-08-23 NOTE — Progress Notes (Signed)
Addended by: Freddi Starr on: 08/23/2011 09:58 AM   Modules accepted: Orders

## 2011-09-02 ENCOUNTER — Other Ambulatory Visit: Payer: Self-pay | Admitting: Thoracic Surgery (Cardiothoracic Vascular Surgery)

## 2011-09-02 DIAGNOSIS — I359 Nonrheumatic aortic valve disorder, unspecified: Secondary | ICD-10-CM

## 2011-09-07 ENCOUNTER — Encounter: Payer: Self-pay | Admitting: Thoracic Surgery (Cardiothoracic Vascular Surgery)

## 2011-09-09 ENCOUNTER — Other Ambulatory Visit: Payer: Self-pay | Admitting: Cardiology

## 2011-09-17 ENCOUNTER — Other Ambulatory Visit: Payer: Self-pay | Admitting: Thoracic Surgery (Cardiothoracic Vascular Surgery)

## 2011-09-17 DIAGNOSIS — I359 Nonrheumatic aortic valve disorder, unspecified: Secondary | ICD-10-CM

## 2011-09-20 ENCOUNTER — Ambulatory Visit: Payer: Self-pay | Admitting: Physician Assistant

## 2011-09-21 ENCOUNTER — Encounter: Payer: Self-pay | Admitting: Thoracic Surgery (Cardiothoracic Vascular Surgery)

## 2011-09-23 ENCOUNTER — Ambulatory Visit: Payer: Self-pay | Admitting: Physician Assistant

## 2011-09-28 ENCOUNTER — Other Ambulatory Visit: Payer: Self-pay | Admitting: Physician Assistant

## 2011-09-29 ENCOUNTER — Encounter: Payer: Self-pay | Admitting: Physician Assistant

## 2011-09-29 ENCOUNTER — Ambulatory Visit (INDEPENDENT_AMBULATORY_CARE_PROVIDER_SITE_OTHER): Payer: Self-pay | Admitting: Physician Assistant

## 2011-09-29 VITALS — BP 116/70 | HR 88 | Ht 65.5 in | Wt 161.0 lb

## 2011-09-29 DIAGNOSIS — Z954 Presence of other heart-valve replacement: Secondary | ICD-10-CM

## 2011-09-29 DIAGNOSIS — Z952 Presence of prosthetic heart valve: Secondary | ICD-10-CM

## 2011-09-29 DIAGNOSIS — I472 Ventricular tachycardia: Secondary | ICD-10-CM

## 2011-09-29 DIAGNOSIS — I2510 Atherosclerotic heart disease of native coronary artery without angina pectoris: Secondary | ICD-10-CM

## 2011-09-29 DIAGNOSIS — I428 Other cardiomyopathies: Secondary | ICD-10-CM

## 2011-09-29 DIAGNOSIS — I4729 Other ventricular tachycardia: Secondary | ICD-10-CM

## 2011-09-29 DIAGNOSIS — I5022 Chronic systolic (congestive) heart failure: Secondary | ICD-10-CM

## 2011-09-29 LAB — BASIC METABOLIC PANEL
BUN: 9 mg/dL (ref 6–23)
CO2: 32 mEq/L (ref 19–32)
Calcium: 8.6 mg/dL (ref 8.4–10.5)
Chloride: 99 mEq/L (ref 96–112)
Creatinine, Ser: 0.8 mg/dL (ref 0.4–1.2)
Glucose, Bld: 96 mg/dL (ref 70–99)

## 2011-09-29 MED ORDER — CARVEDILOL 6.25 MG PO TABS: 6.2500 mg | ORAL_TABLET | Freq: Two times a day (BID) | ORAL | Status: DC

## 2011-09-29 MED ORDER — LISINOPRIL 2.5 MG PO TABS: 2.5000 mg | ORAL_TABLET | Freq: Every day | ORAL | Status: DC

## 2011-09-29 NOTE — Patient Instructions (Addendum)
START LISINOPRIL 2.5 MG 1 TABLET DAILY  A REFILL FOR COREG WAS SENT IN TODAY  TODAY LABS BMET, MAGNESIUM  REPEAT BMET 1 WEEK  PT NEEDS FOLLOW UP APPOINTMENT TO SEE DR. CRENSHAW WITHIN 1 WEEK AFTER ECHO IS DONE PER SCOTT WEAVER, PAC

## 2011-09-29 NOTE — Progress Notes (Signed)
[address] [place], [place]  [address] Phone: [phone] Fax:  [phone]  Date:  09/29/2011   Name:  [name]   DOB:  May 30, 1960   MRN:  [id number]  PCP:  Olga Millers, MD  Primary Cardiologist:  Dr. Olga Millers  Primary Electrophysiologist:  None    History of Present Illness: [name] is a 51 y.o. female who returns for follow up.  She has a h/o AS and CAD. The patient has a long history of severe hypertension and a cardiomyopathy. Her previous workup in 2005 suggested the possibility of amyloid. At that time, she had a normal ferritin level, normal ACE level, normal TSH, and negative HIV. A fat pad biopsy to rule out amyloidosis was negative. However, she was also scheduled to have free kappa light chains which were elevated at 4.16 and her lambda free light chains were 0.97 which was normal. However, her kappa to lambda free light chain ratio was elevated at 4.29. She was seen by hematology/oncology in September of 2010 for question amyloidosis. Apparently W/U negative.   Patient admitted with CHF and URI as well as syncope in 05/2011. Repeat echocardiogram showed an ejection fraction of 20-25%, restrictive filling pattern, moderate AS with moderate to severe AI. 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 There was moderate to severe MR. There was moderate LAE, mild RVE and RAE and moderate TR.  TEE showed an EF of 25-30%, moderate AS and moderate to severe AI. There was moderate MR and moderate LAE. There was moderate TR and a small pericardial effusion. Cardiac cath 05/2011 showed a 70% first diagonal, a 50% first marginal, a 50-60% left circumflex and a 20% right coronary artery. Ejection fraction was 20%. There was mild MR and severe AI. There was mild pulmonary hypertension. Abdominal CT showed fibroids. Dobutamine echo 06/2011 showed depressed LV function but severe aortic stenosis. Her mean gradient increased from 33-39 mm of mercury. Her  aortic valve area at peak infusion was 0.77 cm.  Patient had AVR 23mm Edwards Magna Ease Pericardial Valve Conduit, Aortic Root Enlargement utilizing a Hemashield Graft, Mitral Valve Ring Annuloplasty utilizing 30mm Edwards Physio II Ring in April of 2013. She was [facility] with a life vest.   Last seen by Dr. Olga Millers in 08/2011.  Plan was to f/u with Echo in 3 mos.  If EF improved LifeVest could be d/c'd, otherwise she would need referral to EP for ICD.  BP was too soft to start ACE.  She states she is feeling much better.  Notes class 2-2b DOE.  No orthopnea, PND, edema.  Weights stable.  Chest still sore.  Wants pain meds.  Asked her to speak with surgeon regarding pain medications.  She does not have her LifeVest on today.  States it keeps alarming.  No shocks delivered.  She denies syncope.    Wt Readings from Last 3 Encounters:  09/29/11 161 lb (73.029 kg)  08/23/11 169 lb (76.658 kg)  08/13/11 190 lb 7.6 oz (86.4 kg)     Potassium  Date/Time Value Range Status  08/23/2011 10:22 AM 4.3  3.5 - 5.1 mEq/L Final     Creatinine, Ser  Date/Time Value Range Status  08/23/2011 10:22 AM 0.8  0.4 - 1.2 mg/dL Final     Hemoglobin  Date/Time Value Range Status  08/11/2011  5:10 AM 11.4* 12.0 - 15.0 g/dL Final    Past Medical History  Diagnosis Date  . Chronic systolic heart failure   .  Pulmonary nodule, left     f/u chest CT 2/12: interval clearing of RUL air space nodule, borderline enlarged mediastinal lymph nodes stable, pul. arterial HTN  . Emphysema   . Ovarian cyst     ovarian cystic mass  . Cocaine abuse   . Alcohol abuse   . Hypertension     pulmonary  . 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 Dorris Fetch)  . Mitral stenosis   . Nonischemic cardiomyopathy     a. cath 05/2011 showed a 70% D1, a 50% OM1, a 50-60% left circumflex and a 20% right coronary artery. Ejection fraction was 20%. There was mild MR  and severe AI. There was mild pulmonary hypertension; b.echocardiogram 2/13 EF 20-25%, restrictive filling pattern, mod AS with mod  to severe AI. 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 mod to severe MR, mod LAE, mild RVE, mild RAE, mod TR  . Tubal pregnancy     Hx  . Cannabis abuse   . Fibroids     abd. CT 2/12: large fibroid uterus  . Personal history of noncompliance with medical treatment, presenting hazards to health   . Pulmonary HTN   . COPD (chronic obstructive pulmonary disease) 12/27/2008        . Anxiety   . Ventricular tachycardia     LifeVest    Current Outpatient Prescriptions  Medication Sig Dispense Refill  . aspirin 325 MG tablet Take 325 mg by mouth daily.      . carvedilol (COREG) 6.25 MG tablet Take 1 tablet (6.25 mg total) by mouth 2 (two) times daily with a meal.  60 tablet  1  . cetirizine (ZYRTEC) 10 MG tablet Take 10 mg by mouth daily.      . furosemide (LASIX) 40 MG tablet Take 80 mg by mouth 2 (two) times daily.      Marland Kitchen KLOR-CON M20 20 MEQ tablet TAKE ONE TABLET BY MOUTH TWICE DAILY  60 each  12  . pravastatin (PRAVACHOL) 40 MG tablet Take 40 mg by mouth every evening.      Marland Kitchen DISCONTD: budesonide-formoterol (SYMBICORT) 160-4.5 MCG/ACT inhaler Inhale 2 puffs into the lungs 2 (two) times daily.  1 Inhaler  0  . DISCONTD: tiotropium (SPIRIVA) 18 MCG inhalation capsule Place 1 capsule (18 mcg total) into inhaler and inhale daily.  30 capsule  0    Allergies: Allergies  Allergen Reactions  . Penicillins Hives    History  Substance Use Topics  . Smoking status: Former Smoker -- 1.0 packs/day for 10 years    Types: Cigarettes    Quit date: 05/27/2010  . Smokeless tobacco: Former Neurosurgeon    Quit date: 05/27/2010  . Alcohol Use: 0.6 oz/week    1 Shots of liquor per week     use varies     ROS:  Please see the history of present illness.    All other systems reviewed and negative.   PHYSICAL EXAM: VS:  BP 116/70  Pulse 88  Ht 5' 5.5" (1.664 m)  Wt 161 lb  (73.029 kg)  BMI 26.38 kg/m2 Well nourished, well developed, in no acute distress HEENT: normal Neck: no JVD Cardiac:  normal S1, S2; RRR; 2/6 systolic murmur along LSB Lungs:  clear to auscultation bilaterally, no wheezing, rhonchi or rales Abd: soft, nontender, no hepatomegaly Ext: no edema Skin: warm and dry Neuro:  CNs 2-12 intact, no focal abnormalities noted  EKG:  NSR, HR 79, normal axis, low voltage, TW inversions V4-6, prolonged QT (QTc 520)   ASSESSMENT AND PLAN:  1.  Nonischemic Cardiomyopathy Her blood pressure is improved. I will start low dose ACE with lisinopril 2.5 mg daily. Check a basic metabolic panel today and repeat in one week. Followup 6-8 weeks with Dr. Jens Som after her followup echocardiogram. If EF still down, she will need to see EP for ICD.  2.  Chronic Systolic Congestive Heart Failure Volume stable. Check BMET today.  3.  Status Post Aortic Valve Replacement and Mitral Valve Repair Continue SBE prophy. She sees Dr. Dorris Fetch next week.  4.  Nonsustained Ventricular Tachycardia Explained importance of wearing her LifeVest. Dennis Bast, RN also spoke with patient today.  She will have her LifeVest checked at home. Birgit Nowling knows to put Lifevest back on when she returns home today. QT is prolonged today. Check BMET and Mg2+ today. Continue beta blocker.  5.  Coronary artery disease  No angina. Continue ASA and statin.   Luna Glasgow, PA-C  9:26 AM 09/29/2011

## 2011-09-30 ENCOUNTER — Other Ambulatory Visit: Payer: Self-pay | Admitting: Thoracic Surgery (Cardiothoracic Vascular Surgery)

## 2011-09-30 DIAGNOSIS — I359 Nonrheumatic aortic valve disorder, unspecified: Secondary | ICD-10-CM

## 2011-10-05 ENCOUNTER — Ambulatory Visit
Admission: RE | Admit: 2011-10-05 | Discharge: 2011-10-05 | Disposition: A | Discharge status: Home / Self Care | Payer: No Typology Code available for payment source | Source: Ambulatory Visit | Attending: Thoracic Surgery (Cardiothoracic Vascular Surgery) | Admitting: Thoracic Surgery (Cardiothoracic Vascular Surgery)

## 2011-10-05 ENCOUNTER — Ambulatory Visit (INDEPENDENT_AMBULATORY_CARE_PROVIDER_SITE_OTHER): Payer: Self-pay | Admitting: Thoracic Surgery (Cardiothoracic Vascular Surgery)

## 2011-10-05 ENCOUNTER — Encounter: Payer: Self-pay | Admitting: Thoracic Surgery (Cardiothoracic Vascular Surgery)

## 2011-10-05 VITALS — BP 127/92 | HR 73 | Resp 18 | Ht 65.0 in | Wt 160.0 lb

## 2011-10-05 DIAGNOSIS — I350 Nonrheumatic aortic (valve) stenosis: Secondary | ICD-10-CM

## 2011-10-05 DIAGNOSIS — I059 Rheumatic mitral valve disease, unspecified: Secondary | ICD-10-CM

## 2011-10-05 DIAGNOSIS — I5022 Chronic systolic (congestive) heart failure: Secondary | ICD-10-CM

## 2011-10-05 DIAGNOSIS — Z9889 Other specified postprocedural states: Secondary | ICD-10-CM

## 2011-10-05 DIAGNOSIS — I340 Nonrheumatic mitral (valve) insufficiency: Secondary | ICD-10-CM

## 2011-10-05 DIAGNOSIS — I359 Nonrheumatic aortic valve disorder, unspecified: Secondary | ICD-10-CM

## 2011-10-05 DIAGNOSIS — I7789 Other specified disorders of arteries and arterioles: Secondary | ICD-10-CM

## 2011-10-05 DIAGNOSIS — Z954 Presence of other heart-valve replacement: Secondary | ICD-10-CM

## 2011-10-05 DIAGNOSIS — Z952 Presence of prosthetic heart valve: Secondary | ICD-10-CM

## 2011-10-05 NOTE — Progress Notes (Signed)
HPI:  [name] returns today for a scheduled followup visit. She had an aortic valve replacement with aortic root enlargement and mitral valve repair on April 17.  She states is still having some pain, it is particularly uncomfortable when her shirt rubs across her chest, and she says her nipples are very sensitive. She is requesting pain medication, as Tylenol does not always completely control her symptoms. Her exercise tolerance is markedly improved from what it was preoperatively. She's not having any problems with peripheral edema. She is having some difficulty sleeping. She went through phase where she was crying and felt depressed, but says that is now improved.  Past Medical History  Diagnosis Date  . Chronic systolic heart failure   . Pulmonary nodule, left     f/u chest CT 2/12: interval clearing of RUL air space nodule, borderline enlarged mediastinal lymph nodes stable, pul. arterial HTN  . Emphysema   . Ovarian cyst     ovarian cystic mass  . Cocaine abuse   . Alcohol abuse   . Hypertension     pulmonary  . 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 Dorris Fetch)  . Mitral stenosis   . Nonischemic cardiomyopathy     a. cath 05/2011 showed a 70% D1, a 50% OM1, a 50-60% left circumflex and a 20% right coronary artery. Ejection fraction was 20%. There was mild MR and severe AI. There was mild pulmonary hypertension; b.echocardiogram 2/13 EF 20-25%, restrictive filling pattern, mod AS with mod  to severe AI. 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 mod to severe MR, mod LAE, mild RVE, mild RAE, mod TR  . Tubal pregnancy     Hx  . Cannabis abuse   . Fibroids     abd. CT 2/12: large fibroid uterus  . Personal history of noncompliance with medical treatment, presenting hazards to health   . Pulmonary HTN   . COPD (chronic obstructive pulmonary disease) 12/27/2008        . Anxiety   . Ventricular tachycardia    LifeVest      Current Outpatient Prescriptions  Medication Sig Dispense Refill  . aspirin 325 MG tablet Take 325 mg by mouth daily.      . carvedilol (COREG) 6.25 MG tablet Take 1 tablet (6.25 mg total) by mouth 2 (two) times daily with a meal.  60 tablet  11  . cetirizine (ZYRTEC) 10 MG tablet Take 10 mg by mouth daily.      . furosemide (LASIX) 40 MG tablet Take 80 mg by mouth 2 (two) times daily.      Marland Kitchen KLOR-CON M20 20 MEQ tablet TAKE ONE TABLET BY MOUTH TWICE DAILY  60 each  12  . lisinopril (PRINIVIL,ZESTRIL) 2.5 MG tablet Take 1 tablet (2.5 mg total) by mouth daily.  30 tablet  11  . pravastatin (PRAVACHOL) 40 MG tablet Take 40 mg by mouth every evening.      Marland Kitchen DISCONTD: budesonide-formoterol (SYMBICORT) 160-4.5 MCG/ACT inhaler Inhale 2 puffs into the lungs 2 (two) times daily.  1 Inhaler  0  . DISCONTD: tiotropium (SPIRIVA) 18 MCG inhalation capsule Place 1 capsule (18 mcg total) into inhaler and inhale daily.  30 capsule  0    Physical Exam BP 127/92  Pulse 73  Resp 18  Ht 5\' 5"  (1.651 m)  Wt 160 lb (72.576 kg)  BMI 26.63 kg/m2  SpO2 98%  LMP 08/05/2011 51 year old female  no acute distress Neurologically alert and oriented x3 with no deficits Lungs clear with equal breath sounds bilaterally Cardiac regular rate and rhythm, faint systolic murmur Sternum stable, incision clean, dry and intact  Diagnostic Tests: Chest x-ray 10/05/2011 IMPRESSION:  Persistent but decreased bilateral pleural effusions with basilar  atelectasis.   Impression: 51 year old woman status post aortic valve replacement with root enlargement and mitral valve repair 2 months ago. She really doing very well at this point in time. She has some discomfort, especially cutaneous hypersensitivity. I reassured her that this is normal and should improve with time. I did give her a prescription for hydrocodone 5/325 1-2 tablets by mouth 3 times daily as needed for pain, 40 tablets no refills.  At this  point her activities are unrestricted.  She had questions regarding the LifeVest. I did discuss with her that Dr. Jens Som would be doing another echocardiogram in the near future to determine whether or not she would need an ICD, and the LifeVest would be discontinued once that has been determined.  Plan: She will followup with Dr. Jens Som.  I will be happy to see her back any time if I can be of any further assistance with her care.

## 2011-11-15 ENCOUNTER — Ambulatory Visit (HOSPITAL_COMMUNITY): Payer: Medicaid Other | Attending: Cardiology | Admitting: Radiology

## 2011-11-15 ENCOUNTER — Telehealth: Payer: Self-pay | Admitting: Cardiology

## 2011-11-15 DIAGNOSIS — I059 Rheumatic mitral valve disease, unspecified: Secondary | ICD-10-CM

## 2011-11-15 DIAGNOSIS — I080 Rheumatic disorders of both mitral and aortic valves: Secondary | ICD-10-CM | POA: Insufficient documentation

## 2011-11-15 DIAGNOSIS — I359 Nonrheumatic aortic valve disorder, unspecified: Secondary | ICD-10-CM

## 2011-11-15 DIAGNOSIS — I379 Nonrheumatic pulmonary valve disorder, unspecified: Secondary | ICD-10-CM | POA: Insufficient documentation

## 2011-11-15 DIAGNOSIS — I2789 Other specified pulmonary heart diseases: Secondary | ICD-10-CM | POA: Insufficient documentation

## 2011-11-15 DIAGNOSIS — I319 Disease of pericardium, unspecified: Secondary | ICD-10-CM | POA: Insufficient documentation

## 2011-11-15 DIAGNOSIS — Z952 Presence of prosthetic heart valve: Secondary | ICD-10-CM

## 2011-11-15 DIAGNOSIS — I10 Essential (primary) hypertension: Secondary | ICD-10-CM | POA: Insufficient documentation

## 2011-11-15 DIAGNOSIS — I2510 Atherosclerotic heart disease of native coronary artery without angina pectoris: Secondary | ICD-10-CM | POA: Insufficient documentation

## 2011-11-15 DIAGNOSIS — R55 Syncope and collapse: Secondary | ICD-10-CM | POA: Insufficient documentation

## 2011-11-15 NOTE — Telephone Encounter (Signed)
Spoke with pt, aware of echo results. appt with dr allred scheduled.

## 2011-11-15 NOTE — Progress Notes (Signed)
Echocardiogram performed.  

## 2011-11-15 NOTE — Telephone Encounter (Signed)
Fu call °Pt returning your call  °

## 2011-11-23 ENCOUNTER — Telehealth: Payer: Self-pay | Admitting: Cardiology

## 2011-11-23 NOTE — Telephone Encounter (Signed)
Spoke with pt, she is doing a lot of power walking and exercising to improve her heart function. She is now sore throughout her chest. She is also having stiffness in the mornings. She states this is not heart pain. She is taking tylenol but is using so much it is starting to hurt her stomach. She wonders what else she can take.

## 2011-11-23 NOTE — Telephone Encounter (Signed)
New msg Pt said she has been walking and she has been having muscle pain in her chest. She is saying it is a soreness not chest pain. She wants to talk to you about this.

## 2011-11-23 NOTE — Telephone Encounter (Signed)
Discussed with dr Jens Som. Pt told she can use ibuprofen or aleve. She was told to take the ibuprofen with food.

## 2011-12-22 ENCOUNTER — Ambulatory Visit (INDEPENDENT_AMBULATORY_CARE_PROVIDER_SITE_OTHER): Payer: Self-pay | Admitting: Internal Medicine

## 2011-12-22 ENCOUNTER — Encounter: Payer: Self-pay | Admitting: Internal Medicine

## 2011-12-22 ENCOUNTER — Encounter: Payer: Self-pay | Admitting: *Deleted

## 2011-12-22 VITALS — BP 120/67 | HR 74 | Resp 18 | Ht 65.0 in | Wt 163.0 lb

## 2011-12-22 DIAGNOSIS — I428 Other cardiomyopathies: Secondary | ICD-10-CM

## 2011-12-22 DIAGNOSIS — R55 Syncope and collapse: Secondary | ICD-10-CM

## 2011-12-22 DIAGNOSIS — I10 Essential (primary) hypertension: Secondary | ICD-10-CM

## 2011-12-22 NOTE — Assessment & Plan Note (Signed)
She has had syncope previously in the setting of VT and valvular heart disease. She should continue to wear a LifeVest until her ICD is in place.

## 2011-12-22 NOTE — Assessment & Plan Note (Signed)
Stable No change required today  

## 2011-12-22 NOTE — Patient Instructions (Signed)

## 2011-12-22 NOTE — Progress Notes (Signed)
 Primary Care Physician: Brian Crenshaw, MD   [name] is a 51 y.o. female with a h/o nonischemic cardiomyopathy (EF 20-25%), NYHA Class II/III CHF with valvular heart disease, and prior VT/syncope who presents for EP follow-up.  I initially saw her 2/13 after she presented with syncope in the setting of acute decompensated valvular heart disease, noncompliance, and ventricular tachycardia.  She underwent surgical repair of her aortic and mitral valves and has been treated with an optimal medical regimen.  She reports that she has quite using cocaine and significantly decreased her ETOH intake. She reports robust clinical improvement since her surgery.  She reports that her prior severe SOB/ dyspnea have improved.  Presently, she is able to go to the mall and shop.  She finds this to be a significant improvement and is very pleased with the results of her surgery.   She is wearing a LifeVest.   Today, she denies symptoms of palpitations, chest pain,  lower extremity edema, dizziness, presyncope, syncope, or neurologic sequela. The patient is tolerating medications without difficulties and is otherwise without complaint today.   Past Medical History  Diagnosis Date  . Chronic systolic heart failure   . Pulmonary nodule, left     f/u chest CT 2/12: interval clearing of RUL air space nodule, borderline enlarged mediastinal lymph nodes stable, pul. arterial HTN  . Ovarian cyst     ovarian cystic mass  . Cocaine abuse     resolved  . Alcohol abuse     resolved  . Pulmonary hypertension   . 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 Hendrickson)  . Mitral stenosis   . Nonischemic cardiomyopathy     a. cath 05/2011 showed a 70% D1, a 50% OM1, a 50-60% left circumflex and a 20% right coronary artery. Ejection fraction was 20%. There was mild MR and severe AI. There was mild pulmonary hypertension; b.echocardiogram 2/13  EF 20-25%, restrictive filling pattern, mod AS with mod  to severe AI. 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 mod to severe MR, mod LAE, mild RVE, mild RAE, mod TR  . Tubal pregnancy     Hx  . Cannabis abuse   . Fibroids     abd. CT 2/12: large fibroid uterus  . Personal history of noncompliance with medical treatment, presenting hazards to health   . COPD (chronic obstructive pulmonary disease) 12/27/2008        . Anxiety   . Ventricular tachycardia     LifeVest   Past Surgical History  Procedure Date  . Tubal ligation   . Cardiac catheterization 2008  . Tee without cardioversion 06/08/2011    Procedure: TRANSESOPHAGEAL ECHOCARDIOGRAM (TEE);  Surgeon: Brian S Crenshaw, MD;  Location: [facility] ENDOSCOPY;  Service: Cardiovascular;  Laterality: N/A;  . Tonsillectomy   . Aortic valve replacement 08/04/2011    Procedure: AORTIC VALVE REPLACEMENT (AVR);  Surgeon: Steven C Hendrickson, MD;  Location: [facility] OR;  Service: Open Heart Surgery;  Laterality: N/A;  WITH ROOT ENLARGEMENT  . Mitral valve repair 08/04/2011    Procedure: MITRAL VALVE REPAIR (MVR);  Surgeon: Steven C Hendrickson, MD;  Location: [facility] OR;  Service: Open Heart Surgery;  Laterality: N/A;    Current Outpatient Prescriptions  Medication Sig Dispense Refill  . aspirin 325 MG tablet Take 325 mg by mouth daily.      . carvedilol (COREG) 6.25 MG tablet Take 1 tablet (6.25 mg total) by   mouth 2 (two) times daily with a meal.  60 tablet  11  . cetirizine (ZYRTEC) 10 MG tablet Take 10 mg by mouth daily.      . furosemide (LASIX) 40 MG tablet Take 80 mg by mouth 2 (two) times daily.      . KLOR-CON M20 20 MEQ tablet TAKE ONE TABLET BY MOUTH TWICE DAILY  60 each  12  . lisinopril (PRINIVIL,ZESTRIL) 2.5 MG tablet Take 1 tablet (2.5 mg total) by mouth daily.  30 tablet  11  . pravastatin (PRAVACHOL) 40 MG tablet Take 40 mg by mouth every evening.      . DISCONTD: budesonide-formoterol (SYMBICORT) 160-4.5 MCG/ACT inhaler Inhale 2 puffs into the lungs 2  (two) times daily.  1 Inhaler  0  . DISCONTD: tiotropium (SPIRIVA) 18 MCG inhalation capsule Place 1 capsule (18 mcg total) into inhaler and inhale daily.  30 capsule  0    Allergies  Allergen Reactions  . Penicillins Hives    History   Social History  . Marital Status: Single    Spouse Name: N/A    Number of Children: N/A  . Years of Education: N/A   Occupational History  . Not on file.   Social History Main Topics  . Smoking status: Former Smoker -- 1.0 packs/day for 10 years    Types: Cigarettes    Quit date: 05/27/2010  . Smokeless tobacco: Former User    Quit date: 05/27/2010  . Alcohol Use: 0.6 oz/week    1 Shots of liquor per week     drinks 1-2 drinks most days  . Drug Use: Yes    Special: Marijuana     ongoing marijuana use,  denies cocaine use since surgery  . Sexually Active: Yes   Other Topics Concern  . Not on file   Social History Narrative   Single no childrenTobacco Use - Hx of x-20 yearsHistory Cocaine and Mariguana use    Family History  Problem Relation Age of Onset  . Coronary artery disease    . Hypertension Mother   . Anesthesia problems Neg Hx     ROS- All systems are reviewed and negative except as per the HPI above  Physical Exam: Filed Vitals:   12/22/11 1519  BP: 120/67  Pulse: 74  Resp: 18  Height: 5' 5" (1.651 m)  Weight: 163 lb (73.936 kg)  SpO2: 95%    GEN- The patient is well appearing, alert and oriented x 3 today.   Head- normocephalic, atraumatic Eyes-  Sclera clear, conjunctiva pink Ears- hearing intact Oropharynx- clear Neck- supple, no JVP Lymph- no cervical lymphadenopathy Lungs- Clear to ausculation bilaterally, normal work of breathing Heart- Regular rate and rhythm, 2/6 SEM LUSB (early peaking), crisp S1 and S2 GI- soft, NT, ND, + BS Extremities- no clubbing, cyanosis, or edema MS- no significant deformity or atrophy Skin- no rash or lesion Psych- euthymic mood, full affect Neuro- strength and  sensation are intact  EKG today reveals sinus rhythm 74 bpm, PR 134, QRS 102, QTc 475, diffuse TW inversions, PVCs  Echo 11/15/11 reveals biventricular failure with LVEDD 72, LVEF 20-25% with global HK, and moderate RV failure  Assessment and Plan:  

## 2011-12-22 NOTE — Assessment & Plan Note (Signed)
The patient has a nonischemic CM (EF 20-25%), NYHA Class II/III CHF, prior syncope, and VT.   She has been treated with an optimal medical regimen and has quit using cocaine.  Unfortunately, her EF remains depressed.  At this time, she meets SCD-HeFT criteria for ICD implantation for primary prevention of sudden death.  Risks, benefits, alternatives to ICD implantation were discussed in detail with the patient today. The patient  understands that the risks include but are not limited to bleeding, infection, pneumothorax, perforation, tamponade, vascular damage, renal failure, MI, stroke, death, inappropriate shocks, and lead dislodgement and wishes to proceed.  We will therefore schedule device implantation at the next available time.

## 2011-12-23 ENCOUNTER — Encounter (HOSPITAL_COMMUNITY): Payer: Self-pay | Admitting: Pharmacy Technician

## 2011-12-28 ENCOUNTER — Other Ambulatory Visit (INDEPENDENT_AMBULATORY_CARE_PROVIDER_SITE_OTHER): Payer: Self-pay

## 2011-12-28 DIAGNOSIS — I428 Other cardiomyopathies: Secondary | ICD-10-CM

## 2011-12-28 LAB — BASIC METABOLIC PANEL WITH GFR
BUN: 25 mg/dL — ABNORMAL HIGH (ref 6–23)
CO2: 25 meq/L (ref 19–32)
Calcium: 9.5 mg/dL (ref 8.4–10.5)
Chloride: 102 meq/L (ref 96–112)
Creatinine, Ser: 0.9 mg/dL (ref 0.4–1.2)
GFR: 80.6 mL/min
Glucose, Bld: 95 mg/dL (ref 70–99)
Potassium: 3.9 meq/L (ref 3.5–5.1)
Sodium: 139 meq/L (ref 135–145)

## 2011-12-28 LAB — CBC WITH DIFFERENTIAL/PLATELET
Eosinophils Absolute: 0 10*3/uL (ref 0.0–0.7)
Eosinophils Relative: 0.6 % (ref 0.0–5.0)
HCT: 43.5 % (ref 36.0–46.0)
Lymphs Abs: 1.3 10*3/uL (ref 0.7–4.0)
MCHC: 32.7 g/dL (ref 30.0–36.0)
MCV: 92.4 fl (ref 78.0–100.0)
Monocytes Absolute: 0.5 10*3/uL (ref 0.1–1.0)
Neutrophils Relative %: 58.2 % (ref 43.0–77.0)
Platelets: 220 10*3/uL (ref 150.0–400.0)
RDW: 16.8 % — ABNORMAL HIGH (ref 11.5–14.6)
WBC: 4.4 10*3/uL — ABNORMAL LOW (ref 4.5–10.5)

## 2011-12-28 LAB — PROTIME-INR
INR: 1.1 ratio — ABNORMAL HIGH (ref 0.8–1.0)
Prothrombin Time: 11.6 s (ref 10.2–12.4)

## 2012-01-04 ENCOUNTER — Encounter (HOSPITAL_COMMUNITY): Payer: Self-pay | Admitting: General Practice

## 2012-01-04 ENCOUNTER — Ambulatory Visit (HOSPITAL_COMMUNITY)
Admission: RE | Admit: 2012-01-04 | Discharge: 2012-01-05 | Disposition: A | Discharge status: Home / Self Care | Payer: Medicaid Other | Source: Ambulatory Visit | Attending: Internal Medicine | Admitting: Internal Medicine

## 2012-01-04 ENCOUNTER — Encounter (HOSPITAL_COMMUNITY)
Admission: RE | Disposition: A | Discharge status: Home / Self Care | Payer: Self-pay | Source: Ambulatory Visit | Attending: Internal Medicine

## 2012-01-04 ENCOUNTER — Ambulatory Visit: Payer: Self-pay | Admitting: Pulmonary Disease

## 2012-01-04 DIAGNOSIS — I509 Heart failure, unspecified: Secondary | ICD-10-CM | POA: Insufficient documentation

## 2012-01-04 DIAGNOSIS — I472 Ventricular tachycardia: Secondary | ICD-10-CM

## 2012-01-04 DIAGNOSIS — I5022 Chronic systolic (congestive) heart failure: Secondary | ICD-10-CM | POA: Insufficient documentation

## 2012-01-04 DIAGNOSIS — I428 Other cardiomyopathies: Secondary | ICD-10-CM

## 2012-01-04 DIAGNOSIS — I38 Endocarditis, valve unspecified: Secondary | ICD-10-CM

## 2012-01-04 DIAGNOSIS — R0602 Shortness of breath: Secondary | ICD-10-CM

## 2012-01-04 DIAGNOSIS — Z952 Presence of prosthetic heart valve: Secondary | ICD-10-CM

## 2012-01-04 DIAGNOSIS — I2789 Other specified pulmonary heart diseases: Secondary | ICD-10-CM | POA: Insufficient documentation

## 2012-01-04 DIAGNOSIS — R911 Solitary pulmonary nodule: Secondary | ICD-10-CM | POA: Insufficient documentation

## 2012-01-04 DIAGNOSIS — Z9889 Other specified postprocedural states: Secondary | ICD-10-CM

## 2012-01-04 DIAGNOSIS — R0601 Orthopnea: Secondary | ICD-10-CM

## 2012-01-04 DIAGNOSIS — R55 Syncope and collapse: Secondary | ICD-10-CM

## 2012-01-04 DIAGNOSIS — J449 Chronic obstructive pulmonary disease, unspecified: Secondary | ICD-10-CM

## 2012-01-04 DIAGNOSIS — J4489 Other specified chronic obstructive pulmonary disease: Secondary | ICD-10-CM | POA: Insufficient documentation

## 2012-01-04 DIAGNOSIS — I10 Essential (primary) hypertension: Secondary | ICD-10-CM

## 2012-01-04 DIAGNOSIS — I351 Nonrheumatic aortic (valve) insufficiency: Secondary | ICD-10-CM

## 2012-01-04 DIAGNOSIS — I2510 Atherosclerotic heart disease of native coronary artery without angina pectoris: Secondary | ICD-10-CM

## 2012-01-04 DIAGNOSIS — I350 Nonrheumatic aortic (valve) stenosis: Secondary | ICD-10-CM

## 2012-01-04 DIAGNOSIS — R1900 Intra-abdominal and pelvic swelling, mass and lump, unspecified site: Secondary | ICD-10-CM

## 2012-01-04 DIAGNOSIS — F411 Generalized anxiety disorder: Secondary | ICD-10-CM | POA: Insufficient documentation

## 2012-01-04 DIAGNOSIS — I7789 Other specified disorders of arteries and arterioles: Secondary | ICD-10-CM

## 2012-01-04 HISTORY — PX: CARDIAC DEFIBRILLATOR PLACEMENT: SHX171

## 2012-01-04 HISTORY — DX: Presence of automatic (implantable) cardiac defibrillator: Z95.810

## 2012-01-04 HISTORY — DX: Personal history of other medical treatment: Z92.89

## 2012-01-04 HISTORY — PX: IMPLANTABLE CARDIOVERTER DEFIBRILLATOR IMPLANT: SHX5473

## 2012-01-04 HISTORY — DX: Shortness of breath: R06.02

## 2012-01-04 HISTORY — DX: Pure hypercholesterolemia, unspecified: E78.00

## 2012-01-04 LAB — SURGICAL PCR SCREEN
MRSA, PCR: NEGATIVE
Staphylococcus aureus: POSITIVE — AB

## 2012-01-04 LAB — PREGNANCY, URINE: Preg Test, Ur: NEGATIVE

## 2012-01-04 SURGERY — IMPLANTABLE CARDIOVERTER DEFIBRILLATOR IMPLANT
Anesthesia: LOCAL

## 2012-01-04 MED ORDER — ZOLPIDEM TARTRATE 5 MG PO TABS
5.0000 mg | ORAL_TABLET | Freq: Every evening | ORAL | Status: DC | PRN
  Administered 2012-01-04: 22:00:00 5 mg via ORAL
  Filled 2012-01-04: qty 1

## 2012-01-04 MED ORDER — SODIUM CHLORIDE 0.9 % IJ SOLN
3.0000 mL | Freq: Two times a day (BID) | INTRAMUSCULAR | Status: DC
  Administered 2012-01-04 (×2): 3 mL via INTRAVENOUS

## 2012-01-04 MED ORDER — SODIUM CHLORIDE 0.9 % IR SOLN
80.0000 mg | Status: DC
  Filled 2012-01-04: qty 2

## 2012-01-04 MED ORDER — ACETAMINOPHEN 325 MG PO TABS: 325.0000 mg | ORAL_TABLET | ORAL | Status: DC | PRN

## 2012-01-04 MED ORDER — CHLORHEXIDINE GLUCONATE CLOTH 2 % EX PADS
6.0000 | MEDICATED_PAD | Freq: Every day | CUTANEOUS | Status: DC
  Administered 2012-01-04: 6 via TOPICAL

## 2012-01-04 MED ORDER — MUPIROCIN 2 % EX OINT
TOPICAL_OINTMENT | CUTANEOUS | Status: AC
  Filled 2012-01-04: qty 22

## 2012-01-04 MED ORDER — MIDAZOLAM HCL 5 MG/5ML IJ SOLN
INTRAMUSCULAR | Status: AC
  Filled 2012-01-04: qty 5

## 2012-01-04 MED ORDER — MUPIROCIN 2 % EX OINT
1.0000 "application " | TOPICAL_OINTMENT | Freq: Two times a day (BID) | CUTANEOUS | Status: DC
  Administered 2012-01-04: 1 via NASAL
  Filled 2012-01-04: qty 22

## 2012-01-04 MED ORDER — SODIUM CHLORIDE 0.9 % IJ SOLN: 3.0000 mL | INTRAMUSCULAR | Status: DC | PRN

## 2012-01-04 MED ORDER — FENTANYL CITRATE 0.05 MG/ML IJ SOLN
INTRAMUSCULAR | Status: AC
  Filled 2012-01-04: qty 2

## 2012-01-04 MED ORDER — MUPIROCIN 2 % EX OINT
TOPICAL_OINTMENT | Freq: Two times a day (BID) | CUTANEOUS | Status: DC
  Administered 2012-01-04: 1 via NASAL
  Filled 2012-01-04: qty 22

## 2012-01-04 MED ORDER — VANCOMYCIN HCL IN DEXTROSE 1-5 GM/200ML-% IV SOLN
1000.0000 mg | INTRAVENOUS | Status: DC
  Filled 2012-01-04 (×2): qty 200

## 2012-01-04 MED ORDER — ONDANSETRON HCL 4 MG/2ML IJ SOLN: 4.0000 mg | Freq: Four times a day (QID) | INTRAMUSCULAR | Status: DC | PRN

## 2012-01-04 MED ORDER — VANCOMYCIN HCL IN DEXTROSE 1-5 GM/200ML-% IV SOLN
1000.0000 mg | Freq: Two times a day (BID) | INTRAVENOUS | Status: AC
Start: 2012-01-04 — End: 2012-01-04
  Administered 2012-01-04: 1000 mg via INTRAVENOUS
  Filled 2012-01-04: qty 200

## 2012-01-04 MED ORDER — YOU HAVE A PACEMAKER BOOK
Freq: Once | Status: AC
  Administered 2012-01-04: 22:00:00
  Filled 2012-01-04: qty 1

## 2012-01-04 MED ORDER — POTASSIUM CHLORIDE CRYS ER 20 MEQ PO TBCR
20.0000 meq | EXTENDED_RELEASE_TABLET | Freq: Two times a day (BID) | ORAL | Status: DC
  Administered 2012-01-04 – 2012-01-05 (×3): 20 meq via ORAL
  Filled 2012-01-04: qty 1
  Filled 2012-01-04: qty 2
  Filled 2012-01-04 (×3): qty 1

## 2012-01-04 MED ORDER — MUPIROCIN 2 % EX OINT
TOPICAL_OINTMENT | Freq: Two times a day (BID) | CUTANEOUS | Status: DC
  Filled 2012-01-04: qty 22

## 2012-01-04 MED ORDER — SODIUM CHLORIDE 0.45 % IV SOLN
INTRAVENOUS | Status: DC
  Administered 2012-01-04: 50 mL/h via INTRAVENOUS

## 2012-01-04 MED ORDER — SODIUM CHLORIDE 0.9 % IV SOLN: 250.0000 mL | INTRAVENOUS | Status: DC

## 2012-01-04 MED ORDER — SODIUM CHLORIDE 0.9 % IJ SOLN: 3.0000 mL | Freq: Two times a day (BID) | INTRAMUSCULAR | Status: DC

## 2012-01-04 MED ORDER — LISINOPRIL 2.5 MG PO TABS
2.5000 mg | ORAL_TABLET | Freq: Every day | ORAL | Status: DC
  Administered 2012-01-05: 09:00:00 2.5 mg via ORAL
  Filled 2012-01-04: qty 1

## 2012-01-04 MED ORDER — LIVING BETTER WITH HEART FAILURE BOOK
Freq: Once | Status: AC
Start: 2012-01-04 — End: 2012-01-05
  Administered 2012-01-05
  Filled 2012-01-04: qty 1

## 2012-01-04 MED ORDER — SODIUM CHLORIDE 0.9 % IV SOLN: 250.0000 mL | INTRAVENOUS | Status: DC | PRN

## 2012-01-04 MED ORDER — HEPARIN (PORCINE) IN NACL 2-0.9 UNIT/ML-% IJ SOLN
INTRAMUSCULAR | Status: AC
  Filled 2012-01-04: qty 500

## 2012-01-04 MED ORDER — CARVEDILOL 6.25 MG PO TABS
6.2500 mg | ORAL_TABLET | Freq: Two times a day (BID) | ORAL | Status: DC
  Administered 2012-01-04 – 2012-01-05 (×2): 6.25 mg via ORAL
  Filled 2012-01-04 (×4): qty 1

## 2012-01-04 MED ORDER — HYDROCODONE-ACETAMINOPHEN 5-325 MG PO TABS
1.0000 | ORAL_TABLET | ORAL | Status: DC | PRN
  Administered 2012-01-04 – 2012-01-05 (×5): 1 via ORAL
  Filled 2012-01-04 (×5): qty 1

## 2012-01-04 MED ORDER — FUROSEMIDE 80 MG PO TABS
80.0000 mg | ORAL_TABLET | Freq: Two times a day (BID) | ORAL | Status: DC
  Administered 2012-01-04 – 2012-01-05 (×2): 80 mg via ORAL
  Filled 2012-01-04 (×4): qty 1

## 2012-01-04 MED ORDER — LIDOCAINE HCL (PF) 1 % IJ SOLN
INTRAMUSCULAR | Status: AC
  Filled 2012-01-04: qty 60

## 2012-01-04 NOTE — Plan of Care (Signed)
Problem: Consults Goal: ICD Patient Education (See Patient Education module for education specifics.) Outcome: Progressing Pacemaker/ ICD book given to patient. Patient stated ,"I'll  read in the morning."Offered to show patient ICD video patient declined.

## 2012-01-04 NOTE — Interval H&P Note (Signed)
History and Physical Interval Note:  01/04/2012 12:01 PM  [name]  has presented today for surgery, with the diagnosis of Heart failure  The various methods of treatment have been discussed with the patient and family. After consideration of risks, benefits and other options for treatment, the patient has consented to  Procedure(s) (LRB) with comments: IMPLANTABLE CARDIOVERTER DEFIBRILLATOR IMPLANT (N/A) as a surgical intervention .  The patient's history has been reviewed, patient examined, no change in status, stable for surgery.  I have reviewed the patient's chart and labs.  Questions were answered to the patient's satisfaction.     Hillis Range

## 2012-01-04 NOTE — H&P (View-Only) (Signed)
Primary Care Physician: Olga Millers, MD   [name] is a 51 y.o. female with a h/o nonischemic cardiomyopathy (EF 20-25%), NYHA Class II/III CHF with valvular heart disease, and prior VT/syncope who presents for EP follow-up.  I initially saw her 2/13 after she presented with syncope in the setting of acute decompensated valvular heart disease, noncompliance, and ventricular tachycardia.  She underwent surgical repair of her aortic and mitral valves and has been treated with an optimal medical regimen.  She reports that she has quite using cocaine and significantly decreased her ETOH intake. She reports robust clinical improvement since her surgery.  She reports that her prior severe SOB/ dyspnea have improved.  Presently, she is able to go to the mall and shop.  She finds this to be a significant improvement and is very pleased with the results of her surgery.   She is wearing a LifeVest.   Today, she denies symptoms of palpitations, chest pain,  lower extremity edema, dizziness, presyncope, syncope, or neurologic sequela. The patient is tolerating medications without difficulties and is otherwise without complaint today.   Past Medical History  Diagnosis Date  . Chronic systolic heart failure   . Pulmonary nodule, left     f/u chest CT 2/12: interval clearing of RUL air space nodule, borderline enlarged mediastinal lymph nodes stable, pul. arterial HTN  . Ovarian cyst     ovarian cystic mass  . Cocaine abuse     resolved  . Alcohol abuse     resolved  . Pulmonary hypertension   . 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 Dorris Fetch)  . Mitral stenosis   . Nonischemic cardiomyopathy     a. cath 05/2011 showed a 70% D1, a 50% OM1, a 50-60% left circumflex and a 20% right coronary artery. Ejection fraction was 20%. There was mild MR and severe AI. There was mild pulmonary hypertension; b.echocardiogram 2/13  EF 20-25%, restrictive filling pattern, mod AS with mod  to severe AI. 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 mod to severe MR, mod LAE, mild RVE, mild RAE, mod TR  . Tubal pregnancy     Hx  . Cannabis abuse   . Fibroids     abd. CT 2/12: large fibroid uterus  . Personal history of noncompliance with medical treatment, presenting hazards to health   . COPD (chronic obstructive pulmonary disease) 12/27/2008        . Anxiety   . Ventricular tachycardia     LifeVest   Past Surgical History  Procedure Date  . Tubal ligation   . Cardiac catheterization 2008  . Tee without cardioversion 06/08/2011    Procedure: TRANSESOPHAGEAL ECHOCARDIOGRAM (TEE);  Surgeon: Lewayne Bunting, MD;  Location: [facility] ENDOSCOPY;  Service: Cardiovascular;  Laterality: N/A;  . Tonsillectomy   . Aortic valve replacement 08/04/2011    Procedure: AORTIC VALVE REPLACEMENT (AVR);  Surgeon: Loreli Slot, MD;  Location: [facility] OR;  Service: Open Heart Surgery;  Laterality: N/A;  WITH ROOT ENLARGEMENT  . Mitral valve repair 08/04/2011    Procedure: MITRAL VALVE REPAIR (MVR);  Surgeon: Loreli Slot, MD;  Location: [facility] OR;  Service: Open Heart Surgery;  Laterality: N/A;    Current Outpatient Prescriptions  Medication Sig Dispense Refill  . aspirin 325 MG tablet Take 325 mg by mouth daily.      . carvedilol (COREG) 6.25 MG tablet Take 1 tablet (6.25 mg total) by  mouth 2 (two) times daily with a meal.  60 tablet  11  . cetirizine (ZYRTEC) 10 MG tablet Take 10 mg by mouth daily.      . furosemide (LASIX) 40 MG tablet Take 80 mg by mouth 2 (two) times daily.      Marland Kitchen KLOR-CON M20 20 MEQ tablet TAKE ONE TABLET BY MOUTH TWICE DAILY  60 each  12  . lisinopril (PRINIVIL,ZESTRIL) 2.5 MG tablet Take 1 tablet (2.5 mg total) by mouth daily.  30 tablet  11  . pravastatin (PRAVACHOL) 40 MG tablet Take 40 mg by mouth every evening.      Marland Kitchen DISCONTD: budesonide-formoterol (SYMBICORT) 160-4.5 MCG/ACT inhaler Inhale 2 puffs into the lungs 2  (two) times daily.  1 Inhaler  0  . DISCONTD: tiotropium (SPIRIVA) 18 MCG inhalation capsule Place 1 capsule (18 mcg total) into inhaler and inhale daily.  30 capsule  0    Allergies  Allergen Reactions  . Penicillins Hives    History   Social History  . Marital Status: Single    Spouse Name: N/A    Number of Children: N/A  . Years of Education: N/A   Occupational History  . Not on file.   Social History Main Topics  . Smoking status: Former Smoker -- 1.0 packs/day for 10 years    Types: Cigarettes    Quit date: 05/27/2010  . Smokeless tobacco: Former Neurosurgeon    Quit date: 05/27/2010  . Alcohol Use: 0.6 oz/week    1 Shots of liquor per week     drinks 1-2 drinks most days  . Drug Use: Yes    Special: Marijuana     ongoing marijuana use,  denies cocaine use since surgery  . Sexually Active: Yes   Other Topics Concern  . Not on file   Social History Narrative   Single no childrenTobacco Use - Hx of x-20 yearsHistory Cocaine and Mariguana use    Family History  Problem Relation Age of Onset  . Coronary artery disease    . Hypertension Mother   . Anesthesia problems Neg Hx     ROS- All systems are reviewed and negative except as per the HPI above  Physical Exam: Filed Vitals:   12/22/11 1519  BP: 120/67  Pulse: 74  Resp: 18  Height: 5\' 5"  (1.651 m)  Weight: 163 lb (73.936 kg)  SpO2: 95%    GEN- The patient is well appearing, alert and oriented x 3 today.   Head- normocephalic, atraumatic Eyes-  Sclera clear, conjunctiva pink Ears- hearing intact Oropharynx- clear Neck- supple, no JVP Lymph- no cervical lymphadenopathy Lungs- Clear to ausculation bilaterally, normal work of breathing Heart- Regular rate and rhythm, 2/6 SEM LUSB (early peaking), crisp S1 and S2 GI- soft, NT, ND, + BS Extremities- no clubbing, cyanosis, or edema MS- no significant deformity or atrophy Skin- no rash or lesion Psych- euthymic mood, full affect Neuro- strength and  sensation are intact  EKG today reveals sinus rhythm 74 bpm, PR 134, QRS 102, QTc 475, diffuse TW inversions, PVCs  Echo 11/15/11 reveals biventricular failure with LVEDD 72, LVEF 20-25% with global HK, and moderate RV failure  Assessment and Plan:

## 2012-01-04 NOTE — Progress Notes (Signed)
2055 Patient had 6 beat run of ventricular tachycardia returning to sinus rhythm .Patient asymptomatic blood pressure 109/76.Theodore Demark PA notified will continue to monitor patient. me

## 2012-01-04 NOTE — Op Note (Signed)
SURGEON:  Hillis Range, MD      PREPROCEDURE DIAGNOSES:   1. Nonschemic cardiomyopathy.   2. New York Heart Association class III, heart failure chronically.   3. Unexplained syncope and h/o VT     POSTPROCEDURE DIAGNOSES:   1. Nonschemic cardiomyopathy.   2. New York Heart Association class III, heart failure chronically.   3. Unexplained syncope and h/o VT     PROCEDURES:    1. ICD implantation.  2. Left upper extremity venography  3. Defibrillation threshold testing     INTRODUCTION:  [name] is a 51 y.o. female with a nonischemic CM (EF 20-25%), NYHA Class II/III CHF, prior syncope, and VT.  At this time, she meets SCD-HeFT criteria for ICD implantation for primary prevention of sudden death.  Given prior VT and syncope, she also meets criteria for secondary prevention ICD.  The patient has a narrow QRS and does not meet criteria for revascularization.  The patient has been treated with an optimal medical regimen but continues to have a depressed ejection fraction and NYHA Class III CHF symptoms.  The patient therefore presents today for ICD implantation.      DESCRIPTION OF PROCEDURE:  Informed written consent was obtained and the patient was brought to the electrophysiology lab in the fasting state. The patient was adequately sedated with intravenous Versed, and fentanyl as outlined in the nursing report.  The patient's left chest was prepped and draped in the usual sterile fashion by the EP lab staff.  The skin overlying the left deltopectoral region was infiltrated with lidocaine for local analgesia.  A 5-cm incision was made over the left deltopectoral region.  A left subcutaneous defibrillator pocket was fashioned using a combination of sharp and blunt dissection.  Electrocautery was used to assure hemostasis.   Left Upper extremity Venography:  A venogram of the left upper extremity was performed which revealed a small sized left axillary vein which emptied into a moderate  sized left subclavian vein.    RA/RV Lead Placement: The left axillary vein was cannulated with fluoroscopic visualization. Through the left axillary vein, a [employer], model [id number] (serial number [id number]) right ventricular defibrillator lead was advanced with fluoroscopic visualization into the right ventricular apex position.  Initial right ventricular lead R-wave measured 15.5 mV with impedance of 865 ohms and a threshold of 0.7 volts at 0.5 milliseconds.   The lead was secured to the pectoralis  fascia using #2 silk suture over the suture sleeve.  The pocket then irrigated with copious gentamicin solution.  The lead was then connected to a [employer] [id number] (serial  Number [id number]) ICD.  The defibrillator was placed into the  pocket.  The pocket was then closed in 2 layers with 2.0 Vicryl suture  for the subcutaneous and subcuticular layers.  Steri-Strips and a  sterile dressing were then applied.   DFT Testing: Defibrillation Threshold testing was then performed. Ventricular fibrillation was induced with a T shock.  Adequate sensing of ventricular  fibrillation was observed with minimal dropout with a programmed sensitivity of 1.59mV.  The patient was successfully defibrillated to sinus rhythm with a single 15 joules shock delivered from the device with an impedance of 71 ohms in a duration of 7 seconds.  The patient remained in sinus rhythm thereafter.  There were no early apparent complications.     CONCLUSIONS:   1. Nonschemic cardiomyopathy with chronic New York Heart Association class III heart failure, prior syncope, and  NSVT.   2. Successful ICD implantation.   3. DFT less than or equal to 15 joules.   4. No early apparent complications.

## 2012-01-05 ENCOUNTER — Ambulatory Visit (HOSPITAL_COMMUNITY): Payer: Medicaid Other

## 2012-01-05 ENCOUNTER — Encounter: Payer: Self-pay | Admitting: *Deleted

## 2012-01-05 DIAGNOSIS — I428 Other cardiomyopathies: Secondary | ICD-10-CM

## 2012-01-05 DIAGNOSIS — Z95810 Presence of automatic (implantable) cardiac defibrillator: Secondary | ICD-10-CM | POA: Insufficient documentation

## 2012-01-05 LAB — BASIC METABOLIC PANEL
Calcium: 9.2 mg/dL (ref 8.4–10.5)
GFR calc Af Amer: 81 mL/min — ABNORMAL LOW (ref >90)
GFR calc non Af Amer: 70 mL/min — ABNORMAL LOW (ref >90)
Sodium: 138 mEq/L (ref 135–145)

## 2012-01-05 MED ORDER — ASPIRIN EC 325 MG PO TBEC
325.0000 mg | DELAYED_RELEASE_TABLET | Freq: Every day | ORAL | Status: DC
  Administered 2012-01-05: 10:00:00 325 mg via ORAL
  Filled 2012-01-05: qty 1

## 2012-01-05 MED ORDER — POTASSIUM CHLORIDE CRYS ER 20 MEQ PO TBCR
40.0000 meq | EXTENDED_RELEASE_TABLET | Freq: Once | ORAL | Status: AC
  Administered 2012-01-05: 11:00:00 40 meq via ORAL

## 2012-01-05 NOTE — Progress Notes (Signed)
   SUBJECTIVE: The patient is doing well today.  At this time, she denies chest pain, shortness of breath, or any new concerns.     Marland Kitchen a stronger pump book   Does not apply Once  . carvedilol  6.25 mg Oral BID WC  . Chlorhexidine Gluconate Cloth  6 each Topical Daily  . fentaNYL      . furosemide  80 mg Oral BID  . heparin      . lidocaine      . lisinopril  2.5 mg Oral Daily  . midazolam      . midazolam      . midazolam      . mupirocin ointment  1 application Nasal BID  . mupirocin ointment      . potassium chloride SA  20 mEq Oral BID  . sodium chloride  3 mL Intravenous Q12H  . vancomycin  1,000 mg Intravenous Q12H  . you have a pacemaker book   Does not apply Once  . DISCONTD: gentamicin irrigation  80 mg Irrigation On Call  . DISCONTD: mupirocin ointment   Nasal BID  . DISCONTD: mupirocin ointment   Nasal BID  . DISCONTD: sodium chloride  3 mL Intravenous Q12H  . DISCONTD: vancomycin  1,000 mg Intravenous On Call      . DISCONTD: sodium chloride 50 mL/hr (01/04/12 1047)  . DISCONTD: sodium chloride      OBJECTIVE: Physical Exam: Filed Vitals:   01/05/12 0031 01/05/12 0445 01/05/12 0528 01/05/12 0837  BP: 114/78 118/91  117/102  Pulse: 76 81  80  Temp: 97.5 F (36.4 C) 97.7 F (36.5 C)  97.7 F (36.5 C)  TempSrc: Oral Oral  Oral  Resp: 18 16 12 15   Height:      Weight: 169 lb 15.6 oz (77.1 kg)     SpO2: 97% 99%  99%    Intake/Output Summary (Last 24 hours) at 01/05/12 0841 Last data filed at 01/05/12 0330  Gross per 24 hour  Intake    560 ml  Output      0 ml  Net    560 ml    Telemetry reveals sinus rhythm  GEN- The patient is well appearing, alert and oriented x 3 today.   Head- normocephalic, atraumatic Eyes-  Sclera clear, conjunctiva pink Ears- hearing intact Oropharynx- clear Neck- supple, no JVP Lymph- no cervical lymphadenopathy Lungs- Clear to ausculation bilaterally, normal work of breathing Heart- Regular rate and rhythm, no  murmurs, rubs or gallops, PMI not laterally displaced GI- soft, NT, ND, + BS Extremities- no clubbing, cyanosis, or edema Skin- ICD pocket with mild ecchymosis but no hematoma  LABS: Basic Metabolic Panel:  Basename 01/05/12 0430  NA 138  K 3.4*  CL 101  CO2 26  GLUCOSE 111*  BUN 21  CREATININE 0.93  CALCIUM 9.2  MG --  PHOS --   CXR- no ptx, stable leads  Device interrogation is reviewed and is normal today  ASSESSMENT AND PLAN:  Active Problems:  HYPERTENSION  Coronary atherosclerosis of native coronary artery  Nonischemic cardiomyopathy  Syncope  1. Nonischemic CM/ Chronic systolic dysfunction Doing well s/p ICD implant Routine wound care and follow-up   ICD interrogation is reviewed and is normal this am (see paper chart)  DC to home  Hillis Range, MD 01/05/2012 8:41 AM

## 2012-01-05 NOTE — Discharge Summary (Signed)
ELECTROPHYSIOLOGY DISCHARGE SUMMARY    Patient ID: [name],  MRN: [id number], DOB/AGE: 51-Apr-1962 51 y.o.  Admit date: 01/04/2012 Discharge date: 01/05/2012  Primary Cardiologist: Olga Millers, MD Primary EP: Hillis Range, MD  Primary Discharge Diagnosis:  1. Nonischemic CM with chronic systolic HF, NYHA class III, s/p ICD implantation 2. Unexplained syncope with h/o VT  Secondary Discharge Diagnoses:  1. Valvular heart disease with AS s/p AVR with tissue valve and MS s/p MV repair 2. Pulmonary hypertension 3. Left pulmonary nodule 4. History of polysubstance abuse 5. COPD 6. Anxiety  Procedures This Admission:  [employer] SG, model [phone] (serial number [id number]) right ventricular defibrillator lead was advanced with fluoroscopic visualization into the right ventricular apex position. Initial right ventricular lead R-wave measured 15.5 mV with impedance of 865 ohms and a threshold of 0.7 volts at 0.5 milliseconds. Medtronic Protecta XT Model D314VRG (serial Number [id number]) ICD. DFT 15J. CONCLUSIONS:  1. Nonschemic cardiomyopathy with chronic New York Heart Association class III heart failure, prior syncope, and NSVT.  2. Successful ICD implantation.  3. DFT less than or equal to 15 joules.  4. No early apparent complications.  History and Hospital Course:  [name] is a 51 year old woman with a nonischemic cardiomyopathy (EF 20-25%), NYHA Class III HF with valvular heart disease and prior VT/syncope who presented yesterday for ICD implantation. She tolerated this procedure well without any immediate complication. She remains hemodynamically stable and afebrile. Her chest xray shows stable lead placement without pneumothorax. Her device interrogation has been reviewed by Dr. Johney Frame and shows normal ICD function with stable lead parameters/measurements. Her implant site is intact without significant bleeding or hematoma. She has been given  discharge instructions including wound care and activity restrictions. She will follow-up in 10 days for wound check. There were no changes made to her medications. She has been seen, examined and deemed stable for discharge today by Dr. Hillis Range.  Discharge Vitals: Blood pressure 117/102, pulse 80, temperature 97.7 F (36.5 C), temperature source Oral, resp. rate 15, height 5' 0.5" (1.537 m), weight 169 lb 15.6 oz (77.1 kg), last menstrual period 08/04/2011, SpO2 99.00%.   Labs: Lab Results  Component Value Date   WBC 4.4* 12/28/2011   HGB 14.2 12/28/2011   HCT 43.5 12/28/2011   MCV 92.4 12/28/2011   PLT 220.0 12/28/2011     Lab 01/05/12 0430  NA 138  K 3.4*  CL 101  CO2 26  BUN 21  CREATININE 0.93  CALCIUM 9.2  PROT --  BILITOT --  ALKPHOS --  ALT --  AST --  GLUCOSE 111*    Disposition:  The patient is being discharged in stable condition.  Follow-up:     Follow-up Information    Follow up with [facility] On 01/17/2012. (At 2:30 PM for wound check)    Contact information:   [address]  [address] [place] [address] [phone]       Follow up with Hillis Range, MD. On 04/06/2012. (At 9:15 AM)    Contact information:   [address]  [address] [place] [address] [phone]   Discharge Medications:    Medication List     As of 01/05/2012  8:49 AM    TAKE these medications         aspirin EC 325 MG tablet   Take 325 mg by mouth daily.      carvedilol 6.25 MG tablet  Commonly known as: COREG   Take 1 tablet (6.25 mg total) by mouth 2 (two) times daily with a meal.      cetirizine 10 MG tablet   Commonly known as: ZYRTEC   Take 10 mg by mouth daily.      furosemide 40 MG tablet   Commonly known as: LASIX   Take 80 mg by mouth 2 (two) times daily.      lisinopril 2.5 MG tablet   Commonly known as: PRINIVIL,ZESTRIL   Take 1 tablet (2.5 mg total) by mouth daily.      potassium chloride SA 20 MEQ tablet   Commonly  known as: K-DUR,KLOR-CON   Take 20 mEq by mouth 2 (two) times daily.      pravastatin 40 MG tablet   Commonly known as: PRAVACHOL   Take 40 mg by mouth every evening.       Duration of Discharge Encounter: Greater than 30 minutes including physician time.  Limmie Patricia, PA-C 01/05/2012, 8:49 AM  Hillis Range, MD

## 2012-01-17 ENCOUNTER — Ambulatory Visit: Payer: Self-pay

## 2012-01-20 ENCOUNTER — Encounter: Payer: Self-pay | Admitting: Internal Medicine

## 2012-01-20 ENCOUNTER — Ambulatory Visit (INDEPENDENT_AMBULATORY_CARE_PROVIDER_SITE_OTHER)
Admission: RE | Admit: 2012-01-20 | Discharge: 2012-01-20 | Disposition: A | Discharge status: Home / Self Care | Payer: Self-pay | Source: Ambulatory Visit | Attending: Internal Medicine | Admitting: Internal Medicine

## 2012-01-20 ENCOUNTER — Ambulatory Visit (INDEPENDENT_AMBULATORY_CARE_PROVIDER_SITE_OTHER): Payer: Self-pay | Admitting: *Deleted

## 2012-01-20 DIAGNOSIS — I428 Other cardiomyopathies: Secondary | ICD-10-CM

## 2012-01-20 LAB — ICD DEVICE OBSERVATION
HV IMPEDENCE: 62 Ohm
RV LEAD AMPLITUDE: 20 mv
RV LEAD IMPEDENCE ICD: 494 Ohm
VENTRICULAR PACING ICD: 1 pct

## 2012-01-20 NOTE — Progress Notes (Signed)
Wound check-ICD 

## 2012-01-20 NOTE — Addendum Note (Signed)
Addended by: Forestine Chute on: 01/20/2012 12:45 PM   Modules accepted: Orders

## 2012-01-24 ENCOUNTER — Encounter: Payer: Self-pay | Admitting: Internal Medicine

## 2012-01-24 ENCOUNTER — Ambulatory Visit (INDEPENDENT_AMBULATORY_CARE_PROVIDER_SITE_OTHER): Payer: Self-pay | Admitting: Internal Medicine

## 2012-01-24 VITALS — BP 108/81 | HR 60 | Ht 65.5 in | Wt 164.0 lb

## 2012-01-24 DIAGNOSIS — Z95810 Presence of automatic (implantable) cardiac defibrillator: Secondary | ICD-10-CM

## 2012-01-24 DIAGNOSIS — I428 Other cardiomyopathies: Secondary | ICD-10-CM

## 2012-01-24 LAB — ICD DEVICE OBSERVATION: DEV-0020ICD: NEGATIVE

## 2012-01-24 NOTE — Patient Instructions (Signed)
Your physician recommends that you schedule a follow-up appointment in 3 months with Dr Allred    

## 2012-01-30 ENCOUNTER — Encounter: Payer: Self-pay | Admitting: Internal Medicine

## 2012-01-30 NOTE — Assessment & Plan Note (Signed)
Normal ICD function today There was concern on recent device check that her ICD pacing threshold was abnormal; however on evaluate by me today, her device function is normal. No changes  She will keep previously scheduled follow-up with me.  CXR is reviewed with patient today.

## 2012-01-30 NOTE — Progress Notes (Signed)
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 female who presents today for electrophysiology followup.  Since her ICD implantation, the patient reports doing very well. She denies fevers or chills.  Her ICD pocket has healed nicely.  She is without complaint today.  Past Medical History  Diagnosis Date  . Chronic systolic heart failure   . Pulmonary nodule, left     f/u chest CT 2/12: interval clearing of RUL air space nodule, borderline enlarged mediastinal lymph nodes stable, pul. arterial HTN  . Ovarian cyst     ovarian cystic mass  . Cocaine abuse     resolved  . Alcohol abuse     resolved  . Pulmonary hypertension   . 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 Dorris Fetch)  . Mitral stenosis   . Nonischemic cardiomyopathy     a. cath 05/2011 showed a 70% D1, a 50% OM1, a 50-60% left circumflex and a 20% right coronary artery. Ejection fraction was 20%. There was mild MR and severe AI. There was mild pulmonary hypertension; b.echocardiogram 2/13 EF 20-25%, restrictive filling pattern, mod AS with mod  to severe AI. 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 mod to severe MR, mod LAE, mild RVE, mild RAE, mod TR  . Cannabis abuse   . Fibroids     abd. CT 2/12: large fibroid uterus  . Personal history of noncompliance with medical treatment, presenting hazards to health   . COPD (chronic obstructive pulmonary disease) 12/27/2008        . Anxiety   . Ventricular tachycardia     LifeVest  . Hypertension   . High cholesterol   . ICD (implantable cardiac defibrillator) in place   . History of blood transfusion 1980's    "after tubal pregnancy"  . CHF (congestive heart failure)   . Shortness of breath     "at rest" (01/04/2012)   Past Surgical History  Procedure Date  . Tee without cardioversion 06/08/2011    Procedure: TRANSESOPHAGEAL ECHOCARDIOGRAM (TEE);  Surgeon: Lewayne Bunting, MD;  Location: [facility]  ENDOSCOPY;  Service: Cardiovascular;  Laterality: N/A;  . Aortic valve replacement 08/04/2011    Procedure: AORTIC VALVE REPLACEMENT (AVR);  Surgeon: Loreli Slot, MD;  Location: [facility] OR;  Service: Open Heart Surgery;  Laterality: N/A;  WITH ROOT ENLARGEMENT  . Mitral valve repair 08/04/2011    Procedure: MITRAL VALVE REPAIR (MVR);  Surgeon: Loreli Slot, MD;  Location: [facility] OR;  Service: Open Heart Surgery;  Laterality: N/A;  . Cardiac defibrillator placement 01/04/2012    MDT Protecta XT VR ICD implanted by Dr Johney Frame  . Cardiac valve replacement   . Tonsillectomy ~ 1972  . Ectopic pregnancy surgery 1980's  . Cardiac catheterization 2008; ~ 2011;  05/2011    Current Outpatient Prescriptions  Medication Sig Dispense Refill  . aspirin EC 325 MG tablet Take 325 mg by mouth daily.      . carvedilol (COREG) 6.25 MG tablet Take 1 tablet (6.25 mg total) by mouth 2 (two) times daily with a meal.  60 tablet  11  . cetirizine (ZYRTEC) 10 MG tablet Take 10 mg by mouth daily.      . furosemide (LASIX) 40 MG tablet Take 80 mg by mouth 2 (two) times daily.      Marland Kitchen lisinopril (PRINIVIL,ZESTRIL) 2.5 MG tablet Take 1 tablet (2.5 mg total) by mouth daily.  30 tablet  11  . potassium chloride SA (K-DUR,KLOR-CON) 20 MEQ tablet Take 20 mEq by mouth 2 (two) times daily.      . pravastatin (PRAVACHOL) 40 MG tablet Take 40 mg by mouth every evening.      Marland Kitchen DISCONTD: budesonide-formoterol (SYMBICORT) 160-4.5 MCG/ACT inhaler Inhale 2 puffs into the lungs 2 (two) times daily.  1 Inhaler  0  . DISCONTD: tiotropium (SPIRIVA) 18 MCG inhalation capsule Place 1 capsule (18 mcg total) into inhaler and inhale daily.  30 capsule  0    Physical Exam: Filed Vitals:   01/24/12 1509  BP: 108/81  Pulse: 60  Height: 5' 5.5" (1.664 m)  Weight: 164 lb (74.39 kg)    GEN- The patient is well appearing, alert and oriented x 3 today.   Head- normocephalic, atraumatic Eyes-  Sclera clear, conjunctiva pink Ears-  hearing intact Oropharynx- clear Lungs- Clear to ausculation bilaterally, normal work of breathing Chest- ICD pocket is well healed Heart- Regular rate and rhythm  GI- soft, NT, ND, + BS Extremities- no clubbing, cyanosis, or edema  ICD interrogation- reviewed in detail today,  See PACEART report  Assessment and Plan:  1.  Chronic systolic dysfunction euvolemic today Stable on an appropriate medical regimen Normal ICD function See Pace Art report No changes today

## 2012-02-21 ENCOUNTER — Other Ambulatory Visit: Payer: Self-pay | Admitting: Cardiology

## 2012-04-06 ENCOUNTER — Encounter: Payer: Self-pay | Admitting: Internal Medicine

## 2012-04-06 ENCOUNTER — Ambulatory Visit (INDEPENDENT_AMBULATORY_CARE_PROVIDER_SITE_OTHER): Payer: Self-pay | Admitting: Internal Medicine

## 2012-04-06 VITALS — BP 120/84 | HR 73 | Ht 65.0 in | Wt 175.0 lb

## 2012-04-06 DIAGNOSIS — I428 Other cardiomyopathies: Secondary | ICD-10-CM

## 2012-04-06 DIAGNOSIS — E8779 Other fluid overload: Secondary | ICD-10-CM

## 2012-04-06 DIAGNOSIS — Z95810 Presence of automatic (implantable) cardiac defibrillator: Secondary | ICD-10-CM

## 2012-04-06 LAB — ICD DEVICE OBSERVATION
BATTERY VOLTAGE: 3.2114 V
BRDY-0002RV: 40 {beats}/min
FVT: 0
RV LEAD IMPEDENCE ICD: 456 Ohm
RV LEAD THRESHOLD: 0.5 V
TOT-0001: 1
TZAT-0001FASTVT: 1
TZAT-0001SLOWVT: 1
TZAT-0002FASTVT: NEGATIVE
TZAT-0012FASTVT: 170 ms
TZAT-0012SLOWVT: 170 ms
TZAT-0018FASTVT: NEGATIVE
TZAT-0020SLOWVT: 1.5 ms
TZON-0003VSLOWVT: 350 ms
TZST-0001FASTVT: 4
TZST-0001FASTVT: 5
TZST-0001FASTVT: 6
TZST-0001SLOWVT: 3
TZST-0001SLOWVT: 4
TZST-0001SLOWVT: 5
TZST-0002FASTVT: NEGATIVE
TZST-0002FASTVT: NEGATIVE
TZST-0002FASTVT: NEGATIVE
TZST-0002SLOWVT: NEGATIVE
TZST-0002SLOWVT: NEGATIVE
VF: 0

## 2012-04-06 NOTE — Progress Notes (Signed)
Samantha Millers, MD is Primary Cardiologist:  [name] is a 51 y.o. female who presents today for routine electrophysiology followup.  Since last being seen in our clinic, the patient reports doing very well.  Today, she denies symptoms of palpitations, chest pain, shortness of breath,  lower extremity edema, dizziness, presyncope, syncope, or ICD shocks.  The patient is otherwise without complaint today.   Past Medical History  Diagnosis Date  . Chronic systolic heart failure   . Pulmonary nodule, left     f/u chest CT 2/12: interval clearing of RUL air space nodule, borderline enlarged mediastinal lymph nodes stable, pul. arterial HTN  . Ovarian cyst     ovarian cystic mass  . Cocaine abuse     resolved  . Alcohol abuse     resolved  . Pulmonary hypertension   . 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 Dorris Fetch)  . Mitral stenosis   . Nonischemic cardiomyopathy     a. cath 05/2011 showed a 70% D1, a 50% OM1, a 50-60% left circumflex and a 20% right coronary artery. Ejection fraction was 20%. There was mild MR and severe AI. There was mild pulmonary hypertension; b.echocardiogram 2/13 EF 20-25%, restrictive filling pattern, mod AS with mod  to severe AI. 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 mod to severe MR, mod LAE, mild RVE, mild RAE, mod TR  . Cannabis abuse   . Fibroids     abd. CT 2/12: large fibroid uterus  . Personal history of noncompliance with medical treatment, presenting hazards to health   . COPD (chronic obstructive pulmonary disease) 12/27/2008        . Anxiety   . Ventricular tachycardia     LifeVest  . Hypertension   . High cholesterol   . ICD (implantable cardiac defibrillator) in place   . History of blood transfusion 1980's    "after tubal pregnancy"  . CHF (congestive heart failure)   . Shortness of breath     "at rest" (01/04/2012)   Past Surgical History  Procedure Date  .  Tee without cardioversion 06/08/2011    Procedure: TRANSESOPHAGEAL ECHOCARDIOGRAM (TEE);  Surgeon: Lewayne Bunting, MD;  Location: [facility] ENDOSCOPY;  Service: Cardiovascular;  Laterality: N/A;  . Aortic valve replacement 08/04/2011    Procedure: AORTIC VALVE REPLACEMENT (AVR);  Surgeon: Loreli Slot, MD;  Location: [facility] OR;  Service: Open Heart Surgery;  Laterality: N/A;  WITH ROOT ENLARGEMENT  . Mitral valve repair 08/04/2011    Procedure: MITRAL VALVE REPAIR (MVR);  Surgeon: Loreli Slot, MD;  Location: [facility] OR;  Service: Open Heart Surgery;  Laterality: N/A;  . Cardiac defibrillator placement 01/04/2012    MDT Protecta XT VR ICD implanted by Dr Johney Frame  . Cardiac valve replacement   . Tonsillectomy ~ 1972  . Ectopic pregnancy surgery 1980's  . Cardiac catheterization 2008; ~ 2011;  05/2011    Current Outpatient Prescriptions  Medication Sig Dispense Refill  . aspirin EC 325 MG tablet Take 325 mg by mouth daily.      . carvedilol (COREG) 6.25 MG tablet Take 1 tablet (6.25 mg total) by mouth 2 (two) times daily with a meal.  60 tablet  11  . cetirizine (ZYRTEC) 10 MG tablet Take 10 mg by mouth daily.      . furosemide (LASIX) 40 MG tablet Take 80 mg by mouth 2 (two) times daily.      Marland Kitchen  furosemide (LASIX) 40 MG tablet TAKE TWO TABLETS BY MOUTH TWICE DAILY  120 tablet  5  . lisinopril (PRINIVIL,ZESTRIL) 2.5 MG tablet Take 1 tablet (2.5 mg total) by mouth daily.  30 tablet  11  . potassium chloride SA (K-DUR,KLOR-CON) 20 MEQ tablet Take 20 mEq by mouth 2 (two) times daily.      . pravastatin (PRAVACHOL) 40 MG tablet Take 40 mg by mouth every evening.      . [DISCONTINUED] budesonide-formoterol (SYMBICORT) 160-4.5 MCG/ACT inhaler Inhale 2 puffs into the lungs 2 (two) times daily.  1 Inhaler  0  . [DISCONTINUED] tiotropium (SPIRIVA) 18 MCG inhalation capsule Place 1 capsule (18 mcg total) into inhaler and inhale daily.  30 capsule  0    Physical Exam: Filed Vitals:   04/06/12 0931  BP:  120/84  Pulse: 73  Height: 5\' 5"  (1.651 m)  Weight: 175 lb (79.379 kg)  SpO2: 98%    GEN- The patient is well appearing, alert and oriented x 3 today.   Head- normocephalic, atraumatic Eyes-  Sclera clear, conjunctiva pink Ears- hearing intact Oropharynx- clear Lungs- Clear to ausculation bilaterally, normal work of breathing Chest- ICD pocket is well healed Heart- Regular rate and rhythm, no murmurs, rubs or gallops, PMI not laterally displaced GI- soft, NT, ND, + BS Extremities- no clubbing, cyanosis, or edema  ICD interrogation- reviewed in detail today,  See PACEART report  Assessment and Plan:  1.  Chronic systolic dysfunction euvolemic today Stable on an appropriate medical regimen Normal ICD function See Pace Art report No changes today   Follow-up with Dr Jens Som as scheduled.  Ill see again in 9 months

## 2012-04-06 NOTE — Patient Instructions (Signed)
Your physician recommends that you schedule a follow-up appointment in: 3 months with device clinic and Sept with Dr Johney Frame

## 2012-04-07 ENCOUNTER — Encounter: Payer: Self-pay | Admitting: Internal Medicine

## 2012-07-04 ENCOUNTER — Telehealth: Payer: Self-pay | Admitting: Cardiology

## 2012-07-04 NOTE — Telephone Encounter (Signed)
New Problem:    Patient called in wanting to know her latest Ejection Fraction.  Please call back.

## 2012-07-04 NOTE — Telephone Encounter (Signed)
Left message to call back  

## 2012-07-07 IMAGING — CT CT CHEST W/ CM
2 of 5 series · 15 of 36 positions shown, 18 images · IV contrast (agent unspecified)
Comparison: 07/17/2008 and 05/08/2006.

****Original report can no longer be found.  This report represents
a redictation.****
CLINICAL DATA: Pulmonary nodule.

CT CHEST WITH CONTRAST
TECHNIQUE: Multidetector CT imaging of the chest was performed
following the standard protocol during bolus administration of
intravenous contrast.
Contrast: 100 ml [id number]-[id number].

[Series 4: thins · axial · 0.64mm/px · z∈[-361,+39]mm · 12 of 446 slices shown, 15 images]
[im 23/446  mediastinal]
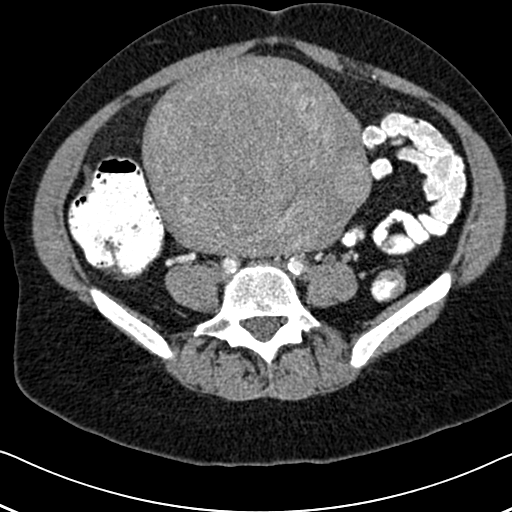
[im 23/446  lung]
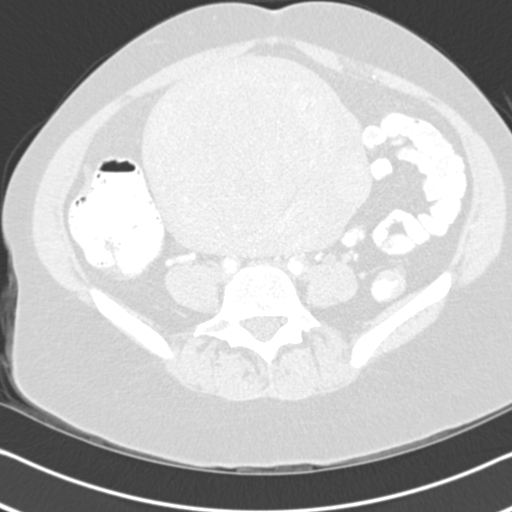
[im 67/446  lung]
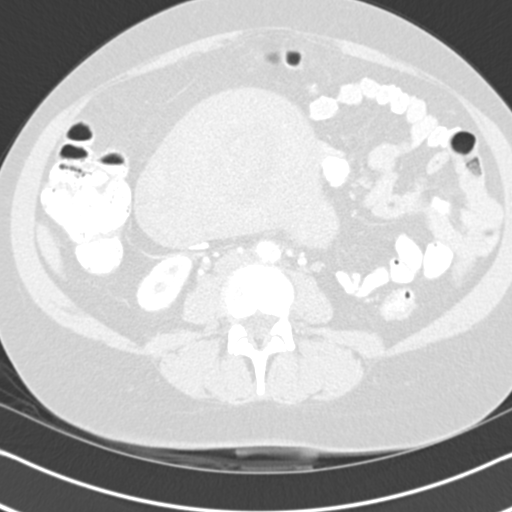
[im 90/446  lung]
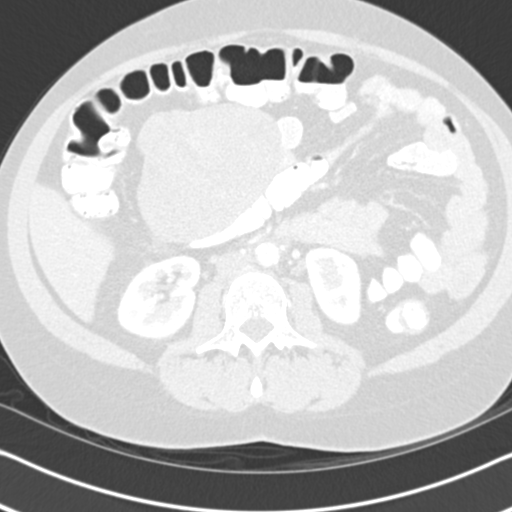
[im 134/446  lung]
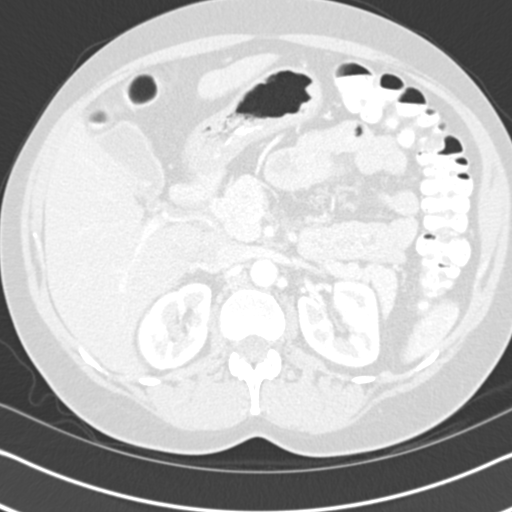
[im 179/446  mediastinal]
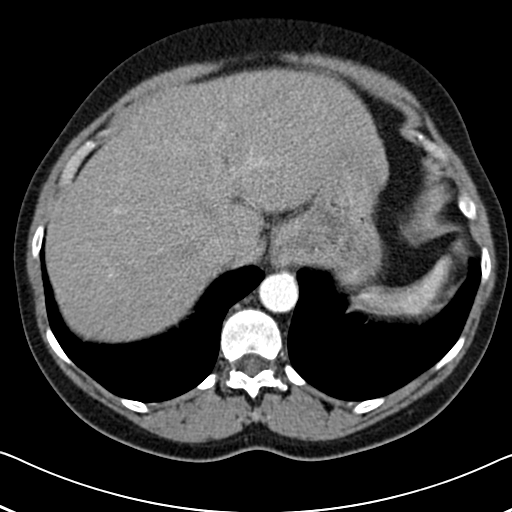
[im 179/446  lung]
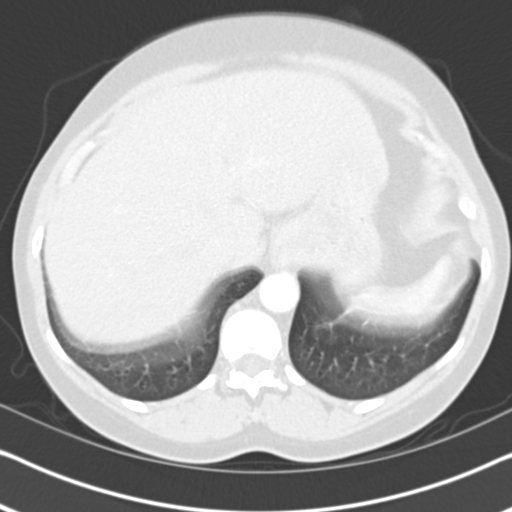
[im 201/446  lung]
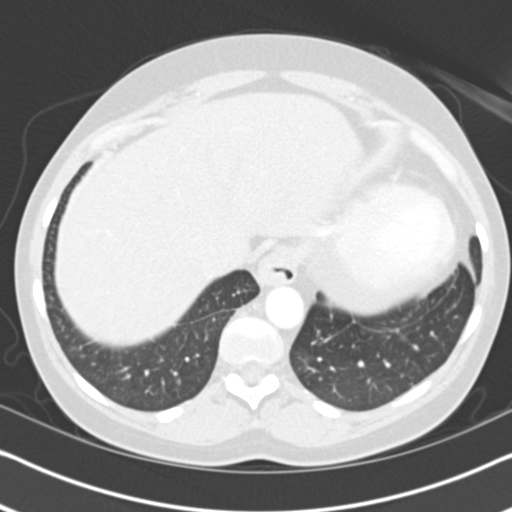
[im 245/446  lung]
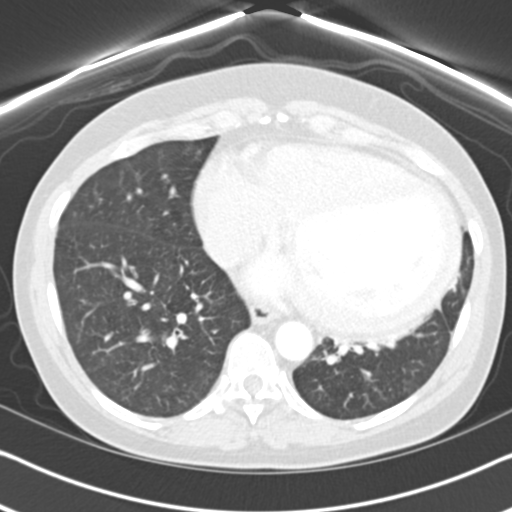
[im 268/446  lung]
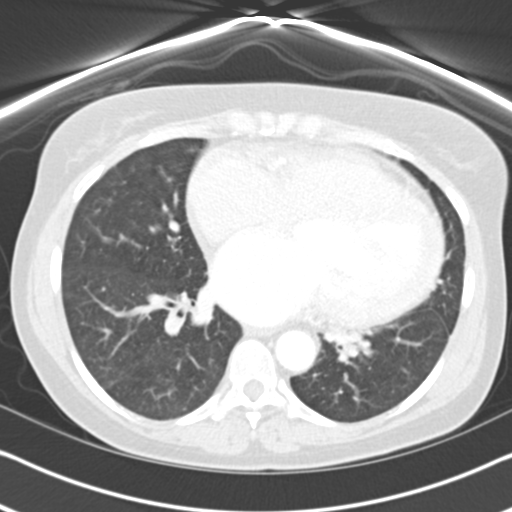
[im 312/446  mediastinal]
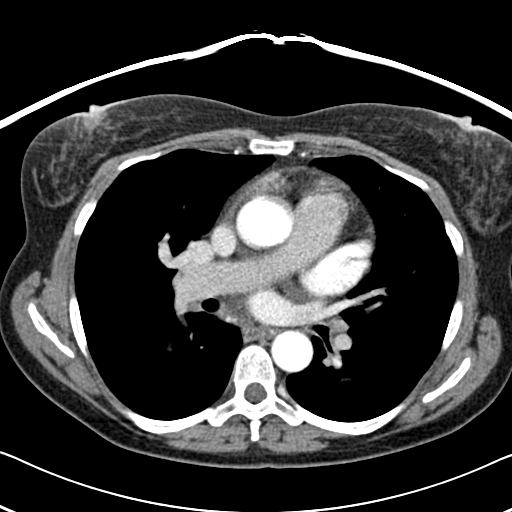
[im 312/446  lung]
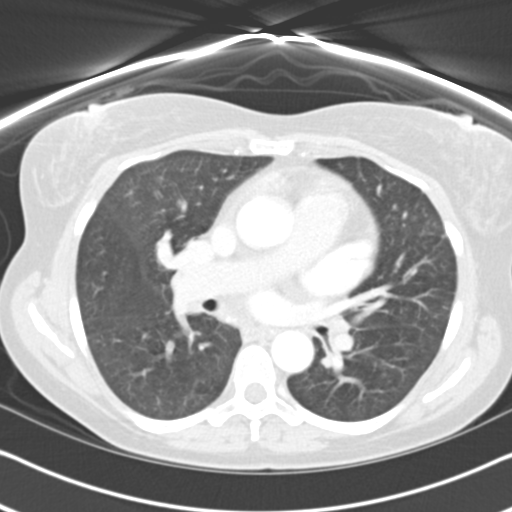
[im 357/446  lung]
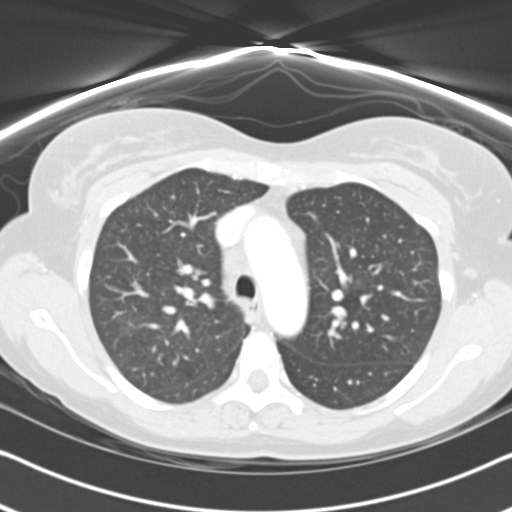
[im 379/446  lung]
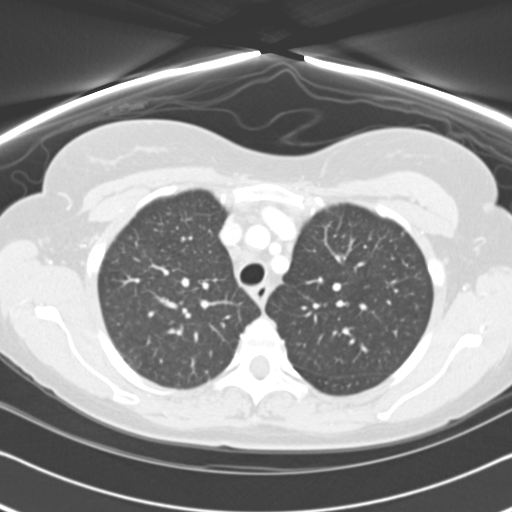
[im 423/446  lung]
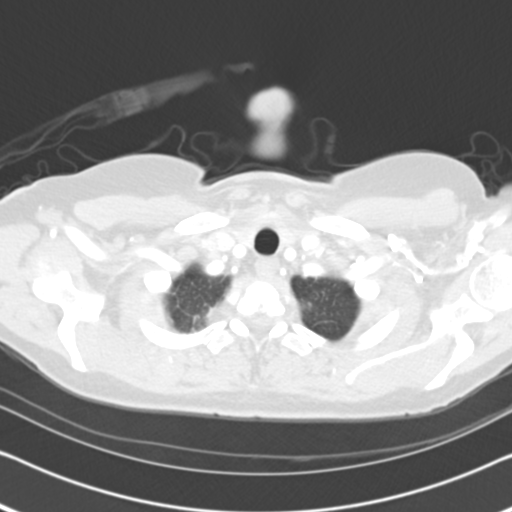

[Series 602: <mpr range> · coronal · 0.87mm/px · 3 of 126 slices shown]
[im 26/126  lung]
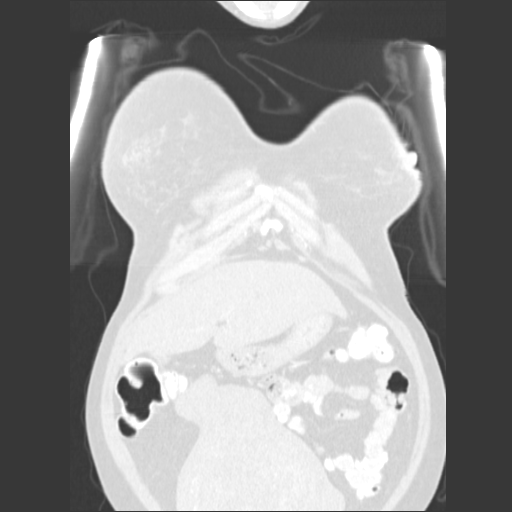
[im 51/126  lung]
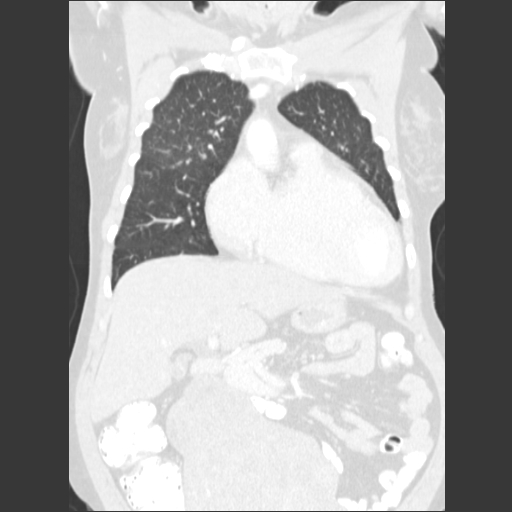
[im 76/126  lung]
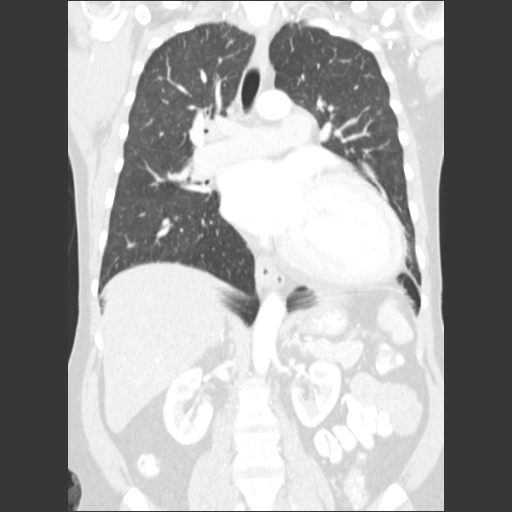

[15 of 36 positions shown; findings below may reference images not displayed]

FINDINGS: Thyroid is homogeneous and may contain small nodules.
Mediastinal lymph nodes measure up to 9 mm in the prevascular
station, as before.  No hilar or axillary adenopathy.  Pulmonary
arteries and heart are enlarged.  No pericardial effusion.  Small
hiatal hernia.

Minimal biapical pleural parenchymal scarring.  There has been
interval clearing of an ill-defined air space nodule in the right
upper lobe.  Atelectasis is seen in the left lower lobe.  No
pleural fluid.  Airway is unremarkable.
IMPRESSION: 1.  This report may represents a redictation as the original report
can no longer be found.
2.  Interval clearing of right upper lobe air space nodule.
3.  Borderline enlarged mediastinal lymph nodes are stable.
4.  Pulmonary arterial hypertension.
5.  CT abdomen is dictated in a separate report.

## 2012-07-07 NOTE — Telephone Encounter (Signed)
N/A.  LMTC. 

## 2012-07-12 NOTE — Telephone Encounter (Signed)
Left message for pt, last echo done 10-2011. No new EF% since then. She will call back with questions.

## 2012-07-13 ENCOUNTER — Ambulatory Visit (INDEPENDENT_AMBULATORY_CARE_PROVIDER_SITE_OTHER): Payer: Medicaid Other | Admitting: Cardiology

## 2012-07-13 ENCOUNTER — Encounter: Payer: Self-pay | Admitting: Internal Medicine

## 2012-07-13 DIAGNOSIS — Z95810 Presence of automatic (implantable) cardiac defibrillator: Secondary | ICD-10-CM

## 2012-07-13 DIAGNOSIS — I5022 Chronic systolic (congestive) heart failure: Secondary | ICD-10-CM

## 2012-07-13 DIAGNOSIS — I428 Other cardiomyopathies: Secondary | ICD-10-CM

## 2012-07-13 LAB — ICD DEVICE OBSERVATION
BATTERY VOLTAGE: 3.18 V
DEV-0020ICD: NEGATIVE
PACEART VT: 0
RV LEAD AMPLITUDE: 20 mv
TZAT-0001FASTVT: 1
TZAT-0019SLOWVT: 8 V
TZAT-0020FASTVT: 1.5 ms
TZAT-0020SLOWVT: 1.5 ms
TZON-0003SLOWVT: 360 ms
TZON-0003VSLOWVT: 350 ms
TZON-0004SLOWVT: 32
TZON-0005SLOWVT: 12
TZST-0001FASTVT: 2
TZST-0001FASTVT: 3
TZST-0001FASTVT: 4
TZST-0001SLOWVT: 2
TZST-0001SLOWVT: 3
TZST-0001SLOWVT: 5
TZST-0002FASTVT: NEGATIVE
TZST-0002FASTVT: NEGATIVE
TZST-0002SLOWVT: NEGATIVE
VENTRICULAR PACING ICD: 0 pct
VF: 0

## 2012-07-13 NOTE — Progress Notes (Signed)
Single chamber ICD check/device clinic visit. Last seen by Dr. Johney Frame in Dec 2013. Patient reports she is doing well and is without complaints. Normal ICD function. See PaceArt report.

## 2012-07-13 NOTE — Patient Instructions (Signed)
Your physician recommends that you schedule a follow-up appointment in: 3 months with Device Clinic     

## 2012-07-27 ENCOUNTER — Other Ambulatory Visit: Payer: Self-pay | Admitting: Cardiology

## 2012-09-02 ENCOUNTER — Other Ambulatory Visit: Payer: Self-pay | Admitting: Cardiology

## 2012-09-12 ENCOUNTER — Other Ambulatory Visit: Payer: Self-pay | Admitting: Cardiology

## 2012-10-02 ENCOUNTER — Other Ambulatory Visit: Payer: Self-pay | Admitting: Cardiology

## 2012-10-09 ENCOUNTER — Ambulatory Visit (INDEPENDENT_AMBULATORY_CARE_PROVIDER_SITE_OTHER): Payer: Medicaid Other | Admitting: *Deleted

## 2012-10-09 ENCOUNTER — Encounter: Payer: Self-pay | Admitting: Internal Medicine

## 2012-10-09 DIAGNOSIS — I428 Other cardiomyopathies: Secondary | ICD-10-CM

## 2012-10-09 LAB — ICD DEVICE OBSERVATION
BATTERY VOLTAGE: 3.1977 V
DEV-0020ICD: NEGATIVE
FVT: 0
HV IMPEDENCE: 71 Ohm
RV LEAD IMPEDENCE ICD: 456 Ohm
RV LEAD THRESHOLD: 1 V
TOT-0002: 0
TOT-0006: 20130917000000
TZAT-0001FASTVT: 1
TZAT-0001SLOWVT: 1
TZAT-0002FASTVT: NEGATIVE
TZAT-0018SLOWVT: NEGATIVE
TZON-0003VSLOWVT: 350 ms
TZON-0004SLOWVT: 32
TZON-0004VSLOWVT: 32
TZON-0005SLOWVT: 12
TZST-0001FASTVT: 3
TZST-0001FASTVT: 4
TZST-0001SLOWVT: 2
TZST-0001SLOWVT: 4
TZST-0001SLOWVT: 6
TZST-0002FASTVT: NEGATIVE
TZST-0002FASTVT: NEGATIVE
TZST-0002FASTVT: NEGATIVE
TZST-0002SLOWVT: NEGATIVE
TZST-0002SLOWVT: NEGATIVE

## 2012-10-09 NOTE — Progress Notes (Signed)
icd check in clinic  

## 2012-10-16 ENCOUNTER — Encounter (HOSPITAL_COMMUNITY): Payer: Self-pay | Admitting: Emergency Medicine

## 2012-10-16 ENCOUNTER — Emergency Department (HOSPITAL_COMMUNITY): Payer: Medicaid Other

## 2012-10-16 ENCOUNTER — Emergency Department (HOSPITAL_COMMUNITY)
Admission: EM | Admit: 2012-10-16 | Discharge: 2012-10-16 | Disposition: A | Discharge status: Home / Self Care | Payer: Medicaid Other | Attending: Emergency Medicine | Admitting: Emergency Medicine

## 2012-10-16 DIAGNOSIS — Z79899 Other long term (current) drug therapy: Secondary | ICD-10-CM | POA: Insufficient documentation

## 2012-10-16 DIAGNOSIS — Z8709 Personal history of other diseases of the respiratory system: Secondary | ICD-10-CM | POA: Insufficient documentation

## 2012-10-16 DIAGNOSIS — Z8659 Personal history of other mental and behavioral disorders: Secondary | ICD-10-CM | POA: Insufficient documentation

## 2012-10-16 DIAGNOSIS — M7660 Achilles tendinitis, unspecified leg: Secondary | ICD-10-CM | POA: Insufficient documentation

## 2012-10-16 DIAGNOSIS — Z8679 Personal history of other diseases of the circulatory system: Secondary | ICD-10-CM | POA: Insufficient documentation

## 2012-10-16 DIAGNOSIS — M7662 Achilles tendinitis, left leg: Secondary | ICD-10-CM

## 2012-10-16 DIAGNOSIS — Z95810 Presence of automatic (implantable) cardiac defibrillator: Secondary | ICD-10-CM | POA: Insufficient documentation

## 2012-10-16 DIAGNOSIS — I2789 Other specified pulmonary heart diseases: Secondary | ICD-10-CM | POA: Insufficient documentation

## 2012-10-16 DIAGNOSIS — J4489 Other specified chronic obstructive pulmonary disease: Secondary | ICD-10-CM | POA: Insufficient documentation

## 2012-10-16 DIAGNOSIS — F1021 Alcohol dependence, in remission: Secondary | ICD-10-CM | POA: Insufficient documentation

## 2012-10-16 DIAGNOSIS — Z87891 Personal history of nicotine dependence: Secondary | ICD-10-CM | POA: Insufficient documentation

## 2012-10-16 DIAGNOSIS — Z880 Allergy status to penicillin: Secondary | ICD-10-CM | POA: Insufficient documentation

## 2012-10-16 DIAGNOSIS — E7800 Pure hypercholesterolemia, unspecified: Secondary | ICD-10-CM | POA: Insufficient documentation

## 2012-10-16 DIAGNOSIS — I5022 Chronic systolic (congestive) heart failure: Secondary | ICD-10-CM | POA: Insufficient documentation

## 2012-10-16 DIAGNOSIS — J449 Chronic obstructive pulmonary disease, unspecified: Secondary | ICD-10-CM | POA: Insufficient documentation

## 2012-10-16 DIAGNOSIS — Z8742 Personal history of other diseases of the female genital tract: Secondary | ICD-10-CM | POA: Insufficient documentation

## 2012-10-16 DIAGNOSIS — Z7982 Long term (current) use of aspirin: Secondary | ICD-10-CM | POA: Insufficient documentation

## 2012-10-16 MED ORDER — NAPROXEN 375 MG PO TABS
375.0000 mg | ORAL_TABLET | Freq: Two times a day (BID) | ORAL | Status: DC
Start: 2012-10-16 — End: 2013-03-14

## 2012-10-16 MED ORDER — ACETAMINOPHEN 325 MG PO TABS
650.0000 mg | ORAL_TABLET | Freq: Once | ORAL | Status: AC
  Administered 2012-10-16: 650 mg via ORAL
  Filled 2012-10-16: qty 2

## 2012-10-16 NOTE — ED Provider Notes (Signed)
History    CSN: [id number] Arrival date & time 10/16/12  1054  First MD Initiated Contact with Patient 10/16/12 1317     Chief Complaint  Patient presents with  . Foot Pain   (Consider location/radiation/quality/duration/timing/severity/associated sxs/prior Treatment) The history is provided by the patient. No language interpreter was used.  [name] is a 52 y/o F with PMHx of chronic systolic heart failure, pulmonary nodule on the left, ETOH and cocaine abuse, pulmonary HTN, mitral and aortic stenosis, COPD, with cardiac defibrillator placement 12/2011, TEE without cardioversion 05/2011, cardiac cath in 2008/2011/2013, mitral valve repair 2013 presenting to the ED with left heel pain that has been ongoing since last Wednesday, described as a throbbing sensation at rest and a stabbing sensation while walking or applying pressure to the left foot - stated that the pain gets worse with walking - denied radiation. Reported that she has been using Tylenol for the pain - denied relief. Denied injury, fall, previous injury, numbness, tingling, weakness.  PCP Dr. Jens Som  Past Medical History  Diagnosis Date  . Chronic systolic heart failure   . Pulmonary nodule, left     f/u chest CT 2/12: interval clearing of RUL air space nodule, borderline enlarged mediastinal lymph nodes stable, pul. arterial HTN  . Ovarian cyst     ovarian cystic mass  . Cocaine abuse     resolved  . Alcohol abuse     resolved  . Pulmonary hypertension   . 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 Dorris Fetch)  . Mitral stenosis   . Nonischemic cardiomyopathy     a. cath 05/2011 showed a 70% D1, a 50% OM1, a 50-60% left circumflex and a 20% right coronary artery. Ejection fraction was 20%. There was mild MR and severe AI. There was mild pulmonary hypertension; b.echocardiogram 2/13 EF 20-25%, restrictive filling pattern, mod AS with mod  to  severe AI. 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 mod to severe MR, mod LAE, mild RVE, mild RAE, mod TR  . Cannabis abuse   . Fibroids     abd. CT 2/12: large fibroid uterus  . Personal history of noncompliance with medical treatment, presenting hazards to health   . COPD (chronic obstructive pulmonary disease) 12/27/2008        . Anxiety   . Ventricular tachycardia     LifeVest  . Hypertension   . High cholesterol   . ICD (implantable cardiac defibrillator) in place   . History of blood transfusion 1980's    "after tubal pregnancy"  . CHF (congestive heart failure)   . Shortness of breath     "at rest" (01/04/2012)   Past Surgical History  Procedure Laterality Date  . Tee without cardioversion  06/08/2011    Procedure: TRANSESOPHAGEAL ECHOCARDIOGRAM (TEE);  Surgeon: Lewayne Bunting, MD;  Location: [facility] ENDOSCOPY;  Service: Cardiovascular;  Laterality: N/A;  . Aortic valve replacement  08/04/2011    Procedure: AORTIC VALVE REPLACEMENT (AVR);  Surgeon: Loreli Slot, MD;  Location: [facility] OR;  Service: Open Heart Surgery;  Laterality: N/A;  WITH ROOT ENLARGEMENT  . Mitral valve repair  08/04/2011    Procedure: MITRAL VALVE REPAIR (MVR);  Surgeon: Loreli Slot, MD;  Location: [facility] OR;  Service: Open Heart Surgery;  Laterality: N/A;  . Cardiac defibrillator placement  01/04/2012    MDT Protecta XT VR ICD implanted by Dr Johney Frame  . Cardiac valve replacement    .  Tonsillectomy  ~ 1972  . Ectopic pregnancy surgery  1980's  . Cardiac catheterization  2008; ~ 2011;  05/2011   Family History  Problem Relation Age of Onset  . Coronary artery disease    . Hypertension Mother   . Anesthesia problems Neg Hx    History  Substance Use Topics  . Smoking status: Former Smoker -- 1.00 packs/day for 10 years    Types: Cigarettes    Quit date: 05/27/2010  . Smokeless tobacco: Never Used  . Alcohol Use: 8.4 oz/week    14 Shots of liquor per week     Comment: drinks 1-2 shots of tequila most days    OB History   Grav Para Term Preterm Abortions TAB SAB Ect Mult Living                 Review of Systems  Constitutional: Negative for fever and chills.  HENT: Negative for neck pain.   Respiratory: Negative for chest tightness and shortness of breath.   Cardiovascular: Negative for chest pain.  Musculoskeletal: Positive for arthralgias.  Skin: Negative for rash and wound.  Neurological: Negative for weakness.  All other systems reviewed and are negative.    Allergies  Penicillins  Home Medications   Current Outpatient Rx  Name  Route  Sig  Dispense  Refill  . aspirin EC 325 MG tablet   Oral   Take 325 mg by mouth daily.         . carvedilol (COREG) 6.25 MG tablet   Oral   Take 6.25 mg by mouth 2 (two) times daily with a meal.         . cetirizine (ZYRTEC) 10 MG tablet   Oral   Take 10 mg by mouth daily.         . furosemide (LASIX) 40 MG tablet   Oral   Take 80 mg by mouth 2 (two) times daily.         Marland Kitchen lisinopril (PRINIVIL,ZESTRIL) 2.5 MG tablet   Oral   Take 2.5 mg by mouth daily.         . potassium chloride SA (K-DUR,KLOR-CON) 20 MEQ tablet   Oral   Take 20 mEq by mouth 2 (two) times daily.         . pravastatin (PRAVACHOL) 40 MG tablet   Oral   Take 40 mg by mouth daily.         . naproxen (NAPROSYN) 375 MG tablet   Oral   Take 1 tablet (375 mg total) by mouth 2 (two) times daily.   20 tablet   0    BP 99/65  Pulse 75  Temp(Src) 97.6 F (36.4 C) (Oral)  Resp 20  SpO2 98% Physical Exam  Nursing note and vitals reviewed. Constitutional: She is oriented to person, place, and time. She appears well-developed and well-nourished. No distress.  HENT:  Head: Normocephalic and atraumatic.  Mouth/Throat: Oropharynx is clear and moist. No oropharyngeal exudate.  Eyes: Conjunctivae and EOM are normal. Pupils are equal, round, and reactive to light. Right eye exhibits no discharge. Left eye exhibits no discharge.  Neck: Normal range of  motion. Neck supple.  Cardiovascular: Normal rate, regular rhythm and normal heart sounds.  Exam reveals no friction rub.   No murmur heard. Pulses:      Radial pulses are 2+ on the right side, and 2+ on the left side.       Dorsalis pedis pulses are 2+ on the right  side, and 2+ on the left side.  Pulmonary/Chest: Effort normal and breath sounds normal. No respiratory distress. She has no wheezes. She has no rales.  Musculoskeletal: She exhibits tenderness. She exhibits no edema.       Legs: Pain upon palpation to posterior aspect of the left leg, near achilles tendon, lateral aspect. Negative warmth upon palpation. Negative erythema noted. mild swelling noted to the left foot. Decreased ROM to the left ankle secondary to pain with motion  Strength 5+/5+ with resistance  Negative Thompson sign   Lymphadenopathy:    She has no cervical adenopathy.  Neurological: She is alert and oriented to person, place, and time. No cranial nerve deficit. She exhibits normal muscle tone. Coordination normal.  Skin: Skin is warm and dry. No rash noted. She is not diaphoretic. No erythema.  Psychiatric: She has a normal mood and affect. Her behavior is normal. Thought content normal.    ED Course  Procedures (including critical care time) Labs Reviewed - No data to display Dg Foot 2 Views Left  10/16/2012   *RADIOLOGY REPORT*  Clinical Data: 52 year old female posterior heel pain.  Foot pain.  LEFT FOOT - 2 VIEW  Comparison: None.  Findings: Bone mineralization is within normal limits.  Calcaneus intact.  Joint spaces and alignment within normal limits in the foot.  No acute fracture or dislocation.  IMPRESSION: No acute osseous abnormality identified about the left foot.   Original Report Authenticated By: Erskine Speed, M.D.   1. Achilles tendonitis, left     MDM  Patient presenting to the ED with left heel pain that has been ongoing since last Wednesday. Denied injury - reported pain to occur when she  was getting out of bed. Pain localized to the posterior aspect of the left achilles region, mainly lateral. Pain upon palpation. Negative inflammation, erythema noted. Mild swelling noted to the left foot. Strength 5+/5+. Pulses palpable. Sensation intact. Doubt septic joint, doubt cellulitis. Imaging negative findings. Negative Thompson sign. Suspicion to be Achilles tendonitis. Patient stable, afebrile. Discharged patient with anti-inflammatories and post-op boot. Discussed with patient to rest and stay hydrated. Elevate leg and ice. Referred to orthopedics. Discussed with patient to monitor symptoms and if symptoms are to worsen or change to report back to the ED - strict return instructions given.  Patient agreed to plan of care, understood, all questions answered.   Raymon Mutton, PA-C 10/16/12 1735

## 2012-10-16 NOTE — ED Notes (Signed)
Since Wednesday worsening left foot pain; denies injury; work up with it; cannot bear weight; denies swelling or bruising; pain is located on back of heel.

## 2012-10-16 NOTE — ED Notes (Signed)
Pt c/o left heel pain x 5 days. No known injury. No deformity noted.

## 2012-10-16 NOTE — ED Notes (Addendum)
Pt states the post op shoe makes her foot hurt worse. PA notified.

## 2012-10-17 ENCOUNTER — Ambulatory Visit: Payer: Medicaid Other | Admitting: Physician Assistant

## 2012-10-17 NOTE — ED Provider Notes (Signed)
Medical screening examination/treatment/procedure(s) were performed by non-physician practitioner and as supervising physician I was immediately available for consultation/collaboration.  Kitiara Hintze, MD 10/17/12 1535 

## 2013-01-19 ENCOUNTER — Encounter: Payer: Self-pay | Admitting: Internal Medicine

## 2013-02-04 ENCOUNTER — Other Ambulatory Visit: Payer: Self-pay | Admitting: Cardiology

## 2013-02-23 ENCOUNTER — Ambulatory Visit: Payer: Medicaid Other | Admitting: Cardiology

## 2013-03-06 ENCOUNTER — Other Ambulatory Visit: Payer: Self-pay | Admitting: Internal Medicine

## 2013-03-08 ENCOUNTER — Other Ambulatory Visit: Payer: Self-pay | Admitting: Cardiology

## 2013-03-08 ENCOUNTER — Other Ambulatory Visit: Payer: Self-pay | Admitting: Internal Medicine

## 2013-03-14 ENCOUNTER — Ambulatory Visit (INDEPENDENT_AMBULATORY_CARE_PROVIDER_SITE_OTHER): Payer: Medicaid Other | Admitting: Internal Medicine

## 2013-03-14 ENCOUNTER — Encounter: Payer: Self-pay | Admitting: Internal Medicine

## 2013-03-14 VITALS — BP 146/98 | HR 68 | Ht 65.0 in | Wt 183.0 lb

## 2013-03-14 DIAGNOSIS — E8779 Other fluid overload: Secondary | ICD-10-CM

## 2013-03-14 DIAGNOSIS — I428 Other cardiomyopathies: Secondary | ICD-10-CM

## 2013-03-14 DIAGNOSIS — I519 Heart disease, unspecified: Secondary | ICD-10-CM

## 2013-03-14 DIAGNOSIS — E8770 Fluid overload, unspecified: Secondary | ICD-10-CM

## 2013-03-14 LAB — MDC_IDC_ENUM_SESS_TYPE_INCLINIC
Brady Statistic RV Percent Paced: 0.28 %
Date Time Interrogation Session: 20141126160705
Lead Channel Impedance Value: 437 Ohm
Lead Channel Pacing Threshold Amplitude: 1 V
Lead Channel Sensing Intrinsic Amplitude: 17.875 mV
Lead Channel Setting Pacing Amplitude: 2.5 V
Lead Channel Setting Pacing Pulse Width: 0.4 ms
Lead Channel Setting Sensing Sensitivity: 0.3 mV
Zone Setting Detection Interval: 280 ms

## 2013-03-14 NOTE — Progress Notes (Signed)
Primary Cardiologist:   Olga Millers, MD    [name] is a 52 y.o. female who presents today for routine electrophysiology followup.  Since last being seen in our clinic, the patient reports doing very well. She has been incarcerated and has therefore not had close cardiology follow-up.  Today, she denies symptoms of palpitations, chest pain, shortness of breath,  lower extremity edema, dizziness, presyncope, syncope, or ICD shocks.  The patient is otherwise without complaint today.   Past Medical History  Diagnosis Date  . Chronic systolic heart failure   . Pulmonary nodule, left     f/u chest CT 2/12: interval clearing of RUL air space nodule, borderline enlarged mediastinal lymph nodes stable, pul. arterial HTN  . Ovarian cyst     ovarian cystic mass  . Cocaine abuse     resolved  . Alcohol abuse     resolved  . Pulmonary hypertension   . 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 Dorris Fetch)  . Mitral stenosis   . Nonischemic cardiomyopathy     a. cath 05/2011 showed a 70% D1, a 50% OM1, a 50-60% left circumflex and a 20% right coronary artery. Ejection fraction was 20%. There was mild MR and severe AI. There was mild pulmonary hypertension; b.echocardiogram 2/13 EF 20-25%, restrictive filling pattern, mod AS with mod  to severe AI. 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 mod to severe MR, mod LAE, mild RVE, mild RAE, mod TR  . Cannabis abuse   . Fibroids     abd. CT 2/12: large fibroid uterus  . Personal history of noncompliance with medical treatment, presenting hazards to health   . COPD (chronic obstructive pulmonary disease) 12/27/2008        . Anxiety   . Ventricular tachycardia     LifeVest  . Hypertension   . High cholesterol   . ICD (implantable cardiac defibrillator) in place   . History of blood transfusion 1980's    "after tubal pregnancy"  . CHF (congestive heart failure)   . Shortness of  breath     "at rest" (01/04/2012)   Past Surgical History  Procedure Laterality Date  . Tee without cardioversion  06/08/2011    Procedure: TRANSESOPHAGEAL ECHOCARDIOGRAM (TEE);  Surgeon: Lewayne Bunting, MD;  Location: [facility] ENDOSCOPY;  Service: Cardiovascular;  Laterality: N/A;  . Aortic valve replacement  08/04/2011    Procedure: AORTIC VALVE REPLACEMENT (AVR);  Surgeon: Loreli Slot, MD;  Location: [facility] OR;  Service: Open Heart Surgery;  Laterality: N/A;  WITH ROOT ENLARGEMENT  . Mitral valve repair  08/04/2011    Procedure: MITRAL VALVE REPAIR (MVR);  Surgeon: Loreli Slot, MD;  Location: [facility] OR;  Service: Open Heart Surgery;  Laterality: N/A;  . Cardiac defibrillator placement  01/04/2012    MDT Protecta XT VR ICD implanted by Dr Johney Frame  . Cardiac valve replacement    . Tonsillectomy  ~ 1972  . Ectopic pregnancy surgery  1980's  . Cardiac catheterization  2008; ~ 2011;  05/2011    Current Outpatient Prescriptions  Medication Sig Dispense Refill  . aspirin EC 325 MG tablet Take 325 mg by mouth daily.      . carvedilol (COREG) 6.25 MG tablet TAKE ONE TABLET BY MOUTH TWICE DAILY WITH  MEALS  60 tablet  0  . cetirizine (ZYRTEC) 10 MG tablet Take 10 mg by mouth daily.      Marland Kitchen  furosemide (LASIX) 40 MG tablet TAKE TWO TABLETS BY MOUTH TWICE DAILY  120 tablet  0  . lisinopril (PRINIVIL,ZESTRIL) 2.5 MG tablet TAKE ONE TABLET BY MOUTH ONCE DAILY  30 tablet  0  . potassium chloride SA (K-DUR,KLOR-CON) 20 MEQ tablet Take 20 mEq by mouth 2 (two) times daily.      . pravastatin (PRAVACHOL) 40 MG tablet TAKE ONE TABLET BY MOUTH ONCE DAILY IN THE EVENING  30 tablet  0  . [DISCONTINUED] budesonide-formoterol (SYMBICORT) 160-4.5 MCG/ACT inhaler Inhale 2 puffs into the lungs 2 (two) times daily.  1 Inhaler  0  . [DISCONTINUED] tiotropium (SPIRIVA) 18 MCG inhalation capsule Place 1 capsule (18 mcg total) into inhaler and inhale daily.  30 capsule  0   No current facility-administered medications  for this visit.    Physical Exam: Filed Vitals:   03/14/13 1526  BP: 146/98  Pulse: 68  Height: 5\' 5"  (1.651 m)  Weight: 183 lb (83.008 kg)    GEN- The patient is well appearing, alert and oriented x 3 today.   Head- normocephalic, atraumatic Eyes-  Sclera clear, conjunctiva pink Ears- hearing intact Oropharynx- clear Lungs- Clear to ausculation bilaterally, normal work of breathing Chest- ICD pocket is well healed Heart- Regular rate and rhythm, no murmurs, rubs or gallops, PMI not laterally displaced GI- soft, NT, ND, + BS Extremities- no clubbing, cyanosis, or edema  ICD interrogation- reviewed in detail today,  See PACEART report  Assessment and Plan:  1.  Chronic systolic dysfunction euvolemic today Stable on an appropriate medical regimen Normal ICD function See Pace Art report No changes today She has had some elevation in her optivol recently but is presently euvolemic. I have enrolled in remote monitoring today.  I have also discussed our Optivol (ICM) program with her.  Once she has her carelink box, [name] will begin following her optivol on a monthly basis.  Follow-up with Dr Jens Som I will see in a year

## 2013-03-14 NOTE — Patient Instructions (Signed)
Your physician recommends that you schedule a follow-up appointment in: 3 months with Dr Jens Som and 12 months with Dr Johney Frame  Remote monitoring is used to monitor your Pacemaker or ICD from home. This monitoring reduces the number of office visits required to check your device to one time per year. It allows [place] to keep an eye on the functioning of your device to ensure it is working properly. You are scheduled for a device check from home on 06/15/13. You may send your transmission at any time that day. If you have a wireless device, the transmission will be sent automatically. After your physician reviews your transmission, you will receive a postcard with your next transmission date.

## 2013-03-26 ENCOUNTER — Telehealth: Payer: Self-pay | Admitting: Internal Medicine

## 2013-03-26 NOTE — Telephone Encounter (Signed)
New message     Have defibulator---want a wireless box to check it from home.

## 2013-03-26 NOTE — Telephone Encounter (Signed)
LMOVM in regards to transmitter. Transmitter was ordered on 03-14-13.

## 2013-04-04 ENCOUNTER — Encounter: Payer: Medicaid Other | Admitting: Internal Medicine

## 2013-04-07 ENCOUNTER — Other Ambulatory Visit: Payer: Self-pay | Admitting: Internal Medicine

## 2013-04-17 ENCOUNTER — Other Ambulatory Visit: Payer: Self-pay | Admitting: Cardiology

## 2013-04-17 ENCOUNTER — Other Ambulatory Visit: Payer: Self-pay | Admitting: Internal Medicine

## 2013-04-23 ENCOUNTER — Ambulatory Visit (INDEPENDENT_AMBULATORY_CARE_PROVIDER_SITE_OTHER): Payer: Medicaid Other | Admitting: *Deleted

## 2013-04-23 ENCOUNTER — Encounter: Payer: Self-pay | Admitting: Internal Medicine

## 2013-04-23 DIAGNOSIS — Z95810 Presence of automatic (implantable) cardiac defibrillator: Secondary | ICD-10-CM

## 2013-04-23 DIAGNOSIS — I428 Other cardiomyopathies: Secondary | ICD-10-CM

## 2013-04-23 DIAGNOSIS — I519 Heart disease, unspecified: Secondary | ICD-10-CM

## 2013-04-23 LAB — MDC_IDC_ENUM_SESS_TYPE_INCLINIC
Brady Statistic RV Percent Paced: 0.63 %
Date Time Interrogation Session: 20150105160833
HIGH POWER IMPEDANCE MEASURED VALUE: 62 Ohm
HighPow Impedance: 19 Ohm
HighPow Impedance: 399 Ohm
Lead Channel Impedance Value: 399 Ohm
Lead Channel Pacing Threshold Amplitude: 1 V
Lead Channel Pacing Threshold Pulse Width: 0.4 ms
Lead Channel Sensing Intrinsic Amplitude: 16.875 mV
Lead Channel Sensing Intrinsic Amplitude: 17.375 mV
Lead Channel Setting Pacing Pulse Width: 0.4 ms
Lead Channel Setting Sensing Sensitivity: 0.3 mV
MDC IDC MSMT BATTERY VOLTAGE: 3.18 V
MDC IDC SET LEADCHNL RV PACING AMPLITUDE: 2.5 V
MDC IDC SET ZONE DETECTION INTERVAL: 350 ms
Zone Setting Detection Interval: 280 ms
Zone Setting Detection Interval: 360 ms

## 2013-04-23 NOTE — Progress Notes (Signed)
Device check in clinic, all functions normal, no changes made, full details in PaceArt.  1 NSVT---12 beats.  Carelink 07/25/13 & ROV w/ Dr. Rayann Heman 02/2014

## 2013-05-04 ENCOUNTER — Encounter: Payer: Self-pay | Admitting: Cardiology

## 2013-05-17 ENCOUNTER — Other Ambulatory Visit: Payer: Self-pay | Admitting: Cardiology

## 2013-06-14 ENCOUNTER — Ambulatory Visit: Payer: Medicaid Other | Admitting: Cardiology

## 2013-06-30 ENCOUNTER — Other Ambulatory Visit: Payer: Self-pay | Admitting: Cardiology

## 2013-07-25 ENCOUNTER — Encounter: Payer: Medicaid Other | Admitting: *Deleted

## 2013-08-02 ENCOUNTER — Encounter: Payer: Self-pay | Admitting: *Deleted

## 2013-08-07 ENCOUNTER — Encounter: Payer: Self-pay | Admitting: Cardiology

## 2013-08-07 ENCOUNTER — Ambulatory Visit (INDEPENDENT_AMBULATORY_CARE_PROVIDER_SITE_OTHER): Payer: Medicaid Other | Admitting: Cardiology

## 2013-08-07 VITALS — BP 130/80 | HR 96 | Ht 65.0 in | Wt 184.0 lb

## 2013-08-07 DIAGNOSIS — I2510 Atherosclerotic heart disease of native coronary artery without angina pectoris: Secondary | ICD-10-CM

## 2013-08-07 DIAGNOSIS — I519 Heart disease, unspecified: Secondary | ICD-10-CM

## 2013-08-07 DIAGNOSIS — D259 Leiomyoma of uterus, unspecified: Secondary | ICD-10-CM

## 2013-08-07 DIAGNOSIS — D219 Benign neoplasm of connective and other soft tissue, unspecified: Secondary | ICD-10-CM

## 2013-08-07 LAB — BASIC METABOLIC PANEL
BUN: 19 mg/dL (ref 6–23)
CO2: 23 mEq/L (ref 19–32)
Calcium: 9.4 mg/dL (ref 8.4–10.5)
Chloride: 107 mEq/L (ref 96–112)
Creatinine, Ser: 1.1 mg/dL (ref 0.4–1.2)
GFR: 67.52 mL/min (ref >60.00)
Glucose, Bld: 115 mg/dL — ABNORMAL HIGH (ref 70–99)
Potassium: 3.9 mEq/L (ref 3.5–5.1)
Sodium: 140 mEq/L (ref 135–145)

## 2013-08-07 LAB — LIPID PANEL
Cholesterol: 230 mg/dL — ABNORMAL HIGH (ref 0–200)
HDL: 51.8 mg/dL (ref >39.00)
LDL Cholesterol: 154 mg/dL — ABNORMAL HIGH (ref 0–99)
Total CHOL/HDL Ratio: 4
Triglycerides: 122 mg/dL (ref 0.0–149.0)
VLDL: 24.4 mg/dL (ref 0.0–40.0)

## 2013-08-07 LAB — HEPATIC FUNCTION PANEL
ALT: 11 U/L (ref 0–35)
AST: 21 U/L (ref 0–37)
Albumin: 4.2 g/dL (ref 3.5–5.2)
Alkaline Phosphatase: 84 U/L (ref 39–117)
Bilirubin, Direct: 0.1 mg/dL (ref 0.0–0.3)
Total Bilirubin: 1.4 mg/dL — ABNORMAL HIGH (ref 0.3–1.2)
Total Protein: 7.6 g/dL (ref 6.0–8.3)

## 2013-08-07 MED ORDER — CARVEDILOL 12.5 MG PO TABS: 12.5000 mg | ORAL_TABLET | Freq: Two times a day (BID) | ORAL | Status: DC

## 2013-08-07 NOTE — Assessment & Plan Note (Signed)
Continue present dose of Lasix. Check potassium and renal function. 

## 2013-08-07 NOTE — Patient Instructions (Signed)
Your physician wants you to follow-up in: [place] will receive a reminder letter in the mail two months in advance. If you don't receive a letter, please call our office to schedule the follow-up appointment.   Your physician has requested that you have an echocardiogram. Echocardiography is a painless test that uses sound waves to create images of your heart. It provides your doctor with information about the size and shape of your heart and how well your heart's chambers and valves are working. This procedure takes approximately one hour. There are no restrictions for this procedure.   Your physician recommends that you HAVE LAB WORK TODAY  INCREASE CARVEDILOL TO 12.5 MG TWICE DAILY  REFERRAL TO OBGYN FOR FIBROIDS  REFERRAL TO  PRIMARY CARE

## 2013-08-07 NOTE — Assessment & Plan Note (Signed)
Continue SBE prophylaxis. Repeat echocardiogram. 

## 2013-08-07 NOTE — Progress Notes (Signed)
HPI: FU AVR secondary to aortic stenosis and CAD. Repeat echocardiogram 2/13 showed an ejection fraction of 20-25%, restrictive filling pattern, moderate aortic stenosis with moderate to severe aortic insufficiency. 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 There was moderate to severe mitral regurgitation. There was moderate left atrial enlargement, mild right ventricular and right atrial enlargement and moderate tricuspid regurgitation. Cardiac catheterization in February of 2012 showed a 70% first diagonal, a 50% first marginal, a 50-60% left circumflex and a 20% right coronary artery. Ejection fraction was 20%. There was mild mitral regurgitation and severe aortic insufficiency. There was mild pulmonary hypertension. Patient had AVR 37mm Edwards Magna Ease Pericardial Valve Conduit Aortic Root Enlargement utilizing a Hemashield Graft; MV Annuloplasty utilizing 1mm Edwards Physio II Ring in April of 2013. Followup echocardiogram in July 2013 showed an ejection fraction of 20-25%, bioprosthetic aortic valve, prior mitral valve repair, mild left atrial enlargement and moderately reduced RV function. Small pericardial effusion. Had ICD placed. She has dyspnea with more extreme activities but not routine activities. No orthopnea, PND or pedal edema. No exertional chest pain. No palpitations or syncope.   Current Outpatient Prescriptions  Medication Sig Dispense Refill  . aspirin EC 325 MG tablet Take 325 mg by mouth daily.      . carvedilol (COREG) 6.25 MG tablet TAKE ONE TABLET BY MOUTH TWICE DAILY WITH MEALS  60 tablet  5  . cetirizine (ZYRTEC) 10 MG tablet Take 10 mg by mouth daily.      . furosemide (LASIX) 40 MG tablet TAKE TWO TABLETS BY MOUTH TWICE DAILY  120 tablet  5  . KLOR-CON M20 20 MEQ tablet TAKE ONE  BY MOUTH TWICE DAILY  60 tablet  6  . lisinopril (PRINIVIL,ZESTRIL) 2.5 MG tablet TAKE ONE TABLET BY MOUTH ONCE DAILY  30 tablet  10  . pravastatin (PRAVACHOL) 40 MG tablet TAKE ONE  TABLET BY MOUTH ONCE DAILY IN THE EVENING  30 tablet  5  . [DISCONTINUED] budesonide-formoterol (SYMBICORT) 160-4.5 MCG/ACT inhaler Inhale 2 puffs into the lungs 2 (two) times daily.  1 Inhaler  0  . [DISCONTINUED] tiotropium (SPIRIVA) 18 MCG inhalation capsule Place 1 capsule (18 mcg total) into inhaler and inhale daily.  30 capsule  0   No current facility-administered medications for this visit.     Past Medical History  Diagnosis Date  . Chronic systolic heart failure   . Pulmonary nodule, left     f/u chest CT 2/12: interval clearing of RUL air space nodule, borderline enlarged mediastinal lymph nodes stable, pul. arterial HTN  . Ovarian cyst     ovarian cystic mass  . Cocaine abuse     resolved  . Alcohol abuse     resolved  . Pulmonary hypertension   . 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 Roxan Hockey)  . Mitral stenosis   . Nonischemic cardiomyopathy     a. cath 05/2011 showed a 70% D1, a 50% OM1, a 50-60% left circumflex and a 20% right coronary artery. Ejection fraction was 20%. There was mild MR and severe AI. There was mild pulmonary hypertension; b.echocardiogram 2/13 EF 20-25%, restrictive filling pattern, mod AS with mod  to severe AI. 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 mod to severe MR, mod LAE, mild RVE, mild RAE, mod TR  . Cannabis abuse   . Fibroids     abd. CT 2/12: large fibroid uterus  .  Personal history of noncompliance with medical treatment, presenting hazards to health   . COPD (chronic obstructive pulmonary disease) 12/27/2008        . Anxiety   . Ventricular tachycardia     LifeVest  . Hypertension   . High cholesterol   . ICD (implantable cardiac defibrillator) in place   . History of blood transfusion 1980's    "after tubal pregnancy"  . CHF (congestive heart failure)   . Shortness of breath     "at rest" (01/04/2012)    Past Surgical History  Procedure Laterality Date  .  Tee without cardioversion  06/08/2011    Procedure: TRANSESOPHAGEAL ECHOCARDIOGRAM (TEE);  Surgeon: Lelon Perla, MD;  Location: [place];  Service: Cardiovascular;  Laterality: N/A;  . Aortic valve replacement  08/04/2011    Procedure: AORTIC VALVE REPLACEMENT (AVR);  Surgeon: Melrose Nakayama, MD;  Location: [place];  Service: Open Heart Surgery;  Laterality: N/A;  WITH ROOT ENLARGEMENT  . Mitral valve repair  08/04/2011    Procedure: MITRAL VALVE REPAIR (MVR);  Surgeon: Melrose Nakayama, MD;  Location: [place];  Service: Open Heart Surgery;  Laterality: N/A;  . Cardiac defibrillator placement  01/04/2012    MDT Protecta XT VR ICD implanted by Dr Rayann Heman  . Cardiac valve replacement    . Tonsillectomy  ~ 1972  . Ectopic pregnancy surgery  1980's  . Cardiac catheterization  2008; ~ 2011;  05/2011    History   Social History  . Marital Status: Single    Spouse Name: N/A    Number of Children: N/A  . Years of Education: N/A   Occupational History  . Not on file.   Social History Main Topics  . Smoking status: Former Smoker -- 1.00 packs/day for 10 years    Types: Cigarettes    Quit date: 05/27/2010  . Smokeless tobacco: Never Used  . Alcohol Use: 8.4 oz/week    14 Shots of liquor per week     Comment: drinks 1-2 shots of tequila most days  . Drug Use: Yes    Special: Marijuana, Cocaine     Comment: 01/04/2012 "last marijuana last week"  . Sexual Activity: Not Currently   Other Topics Concern  . Not on file   Social History Narrative   Single no children   Tobacco Use - Hx of x-20 years   History Cocaine and Mariguana use    ROS: Problems with abdominal distention from fibroids no fevers or chills, productive cough, hemoptysis, dysphasia, odynophagia, melena, hematochezia, dysuria, hematuria, rash, seizure activity, orthopnea, PND, pedal edema, claudication. Remaining systems are negative.  Physical Exam: [name] obese in no acute distress.  Skin is warm  and dry.  HEENT is normal.  Neck is supple.  Chest is clear to auscultation with normal expansion.  Cardiovascular exam is regular rate and rhythm. 3/6 systolic murmur left sternal border Abdominal exam nontender; mildly distended. No masses palpated. Extremities show no edema. neuro grossly intact  ECG Sinus rhythm at a rate of 96. Occasional PVC.  Lateral and inferior T-wave inversion. Prolonged QT.

## 2013-08-07 NOTE — Assessment & Plan Note (Signed)
Continue ACE inhibitor and beta blocker. Increase Coreg to 12.5 mg by mouth twice a day.

## 2013-08-07 NOTE — Assessment & Plan Note (Signed)
Continue aspirin and statin. Check lipids and liver. 

## 2013-08-07 NOTE — Assessment & Plan Note (Signed)
Followed by electrophysiology. 

## 2013-08-07 NOTE — Assessment & Plan Note (Signed)
Continue SBE prophylaxis. 

## 2013-08-07 NOTE — Assessment & Plan Note (Signed)
History of fibroids. Symptomatic. We'll ask GYN to evaluate. I will also obtain a primary care physician for patient.

## 2013-08-07 NOTE — Assessment & Plan Note (Signed)
Blood pressure controlled. Continue present medications. Check potassium and renal function. 

## 2013-08-08 ENCOUNTER — Telehealth: Payer: Self-pay | Admitting: Cardiology

## 2013-08-08 DIAGNOSIS — E7800 Pure hypercholesterolemia, unspecified: Secondary | ICD-10-CM

## 2013-08-08 MED ORDER — ATORVASTATIN CALCIUM 80 MG PO TABS: 80.0000 mg | ORAL_TABLET | Freq: Every day | ORAL | Status: DC

## 2013-08-08 NOTE — Telephone Encounter (Signed)
Spoke with pt, aware of lab results. Patient voiced understanding of med change.

## 2013-08-08 NOTE — Telephone Encounter (Signed)
New Message:  Pt states she is returning a call to [employer]

## 2013-08-09 ENCOUNTER — Encounter: Payer: Self-pay | Admitting: Obstetrics and Gynecology

## 2013-08-17 ENCOUNTER — Other Ambulatory Visit (INDEPENDENT_AMBULATORY_CARE_PROVIDER_SITE_OTHER): Payer: Medicaid Other

## 2013-08-17 ENCOUNTER — Ambulatory Visit (HOSPITAL_COMMUNITY): Payer: Medicaid Other | Attending: Internal Medicine | Admitting: Radiology

## 2013-08-17 DIAGNOSIS — I080 Rheumatic disorders of both mitral and aortic valves: Secondary | ICD-10-CM | POA: Insufficient documentation

## 2013-08-17 DIAGNOSIS — I359 Nonrheumatic aortic valve disorder, unspecified: Secondary | ICD-10-CM

## 2013-08-17 DIAGNOSIS — I2510 Atherosclerotic heart disease of native coronary artery without angina pectoris: Secondary | ICD-10-CM

## 2013-08-17 DIAGNOSIS — J449 Chronic obstructive pulmonary disease, unspecified: Secondary | ICD-10-CM | POA: Insufficient documentation

## 2013-08-17 DIAGNOSIS — I350 Nonrheumatic aortic (valve) stenosis: Secondary | ICD-10-CM

## 2013-08-17 DIAGNOSIS — I10 Essential (primary) hypertension: Secondary | ICD-10-CM | POA: Insufficient documentation

## 2013-08-17 DIAGNOSIS — I519 Heart disease, unspecified: Secondary | ICD-10-CM

## 2013-08-17 DIAGNOSIS — I509 Heart failure, unspecified: Secondary | ICD-10-CM | POA: Insufficient documentation

## 2013-08-17 DIAGNOSIS — J4489 Other specified chronic obstructive pulmonary disease: Secondary | ICD-10-CM | POA: Insufficient documentation

## 2013-08-17 DIAGNOSIS — I7789 Other specified disorders of arteries and arterioles: Secondary | ICD-10-CM

## 2013-08-17 DIAGNOSIS — E7800 Pure hypercholesterolemia, unspecified: Secondary | ICD-10-CM

## 2013-08-17 DIAGNOSIS — I059 Rheumatic mitral valve disease, unspecified: Secondary | ICD-10-CM

## 2013-08-17 DIAGNOSIS — I428 Other cardiomyopathies: Secondary | ICD-10-CM | POA: Insufficient documentation

## 2013-08-17 DIAGNOSIS — I2789 Other specified pulmonary heart diseases: Secondary | ICD-10-CM | POA: Insufficient documentation

## 2013-08-17 DIAGNOSIS — Z954 Presence of other heart-valve replacement: Secondary | ICD-10-CM | POA: Insufficient documentation

## 2013-08-17 DIAGNOSIS — E785 Hyperlipidemia, unspecified: Secondary | ICD-10-CM | POA: Insufficient documentation

## 2013-08-17 LAB — LIPID PANEL
CHOLESTEROL: 132 mg/dL (ref 0–200)
HDL: 41.2 mg/dL (ref >39.00)
LDL Cholesterol: 74 mg/dL (ref 0–99)
Total CHOL/HDL Ratio: 3
Triglycerides: 82 mg/dL (ref 0.0–149.0)
VLDL: 16.4 mg/dL (ref 0.0–40.0)

## 2013-08-17 LAB — HEPATIC FUNCTION PANEL
ALT: 15 U/L (ref 0–35)
AST: 20 U/L (ref 0–37)
Albumin: 4 g/dL (ref 3.5–5.2)
Alkaline Phosphatase: 73 U/L (ref 39–117)
BILIRUBIN DIRECT: 0.2 mg/dL (ref 0.0–0.3)
Total Bilirubin: 0.9 mg/dL (ref 0.3–1.2)
Total Protein: 6.5 g/dL (ref 6.0–8.3)

## 2013-08-17 NOTE — Progress Notes (Signed)
Echocardiogram performed.  

## 2013-09-09 IMAGING — CR DG CHEST 1V PORT
1 series · 1 of 1 positions shown · non-contrast
Comparison: 08/02/2011

CLINICAL DATA: Postop

PORTABLE CHEST - 1 VIEW

[view not recorded]
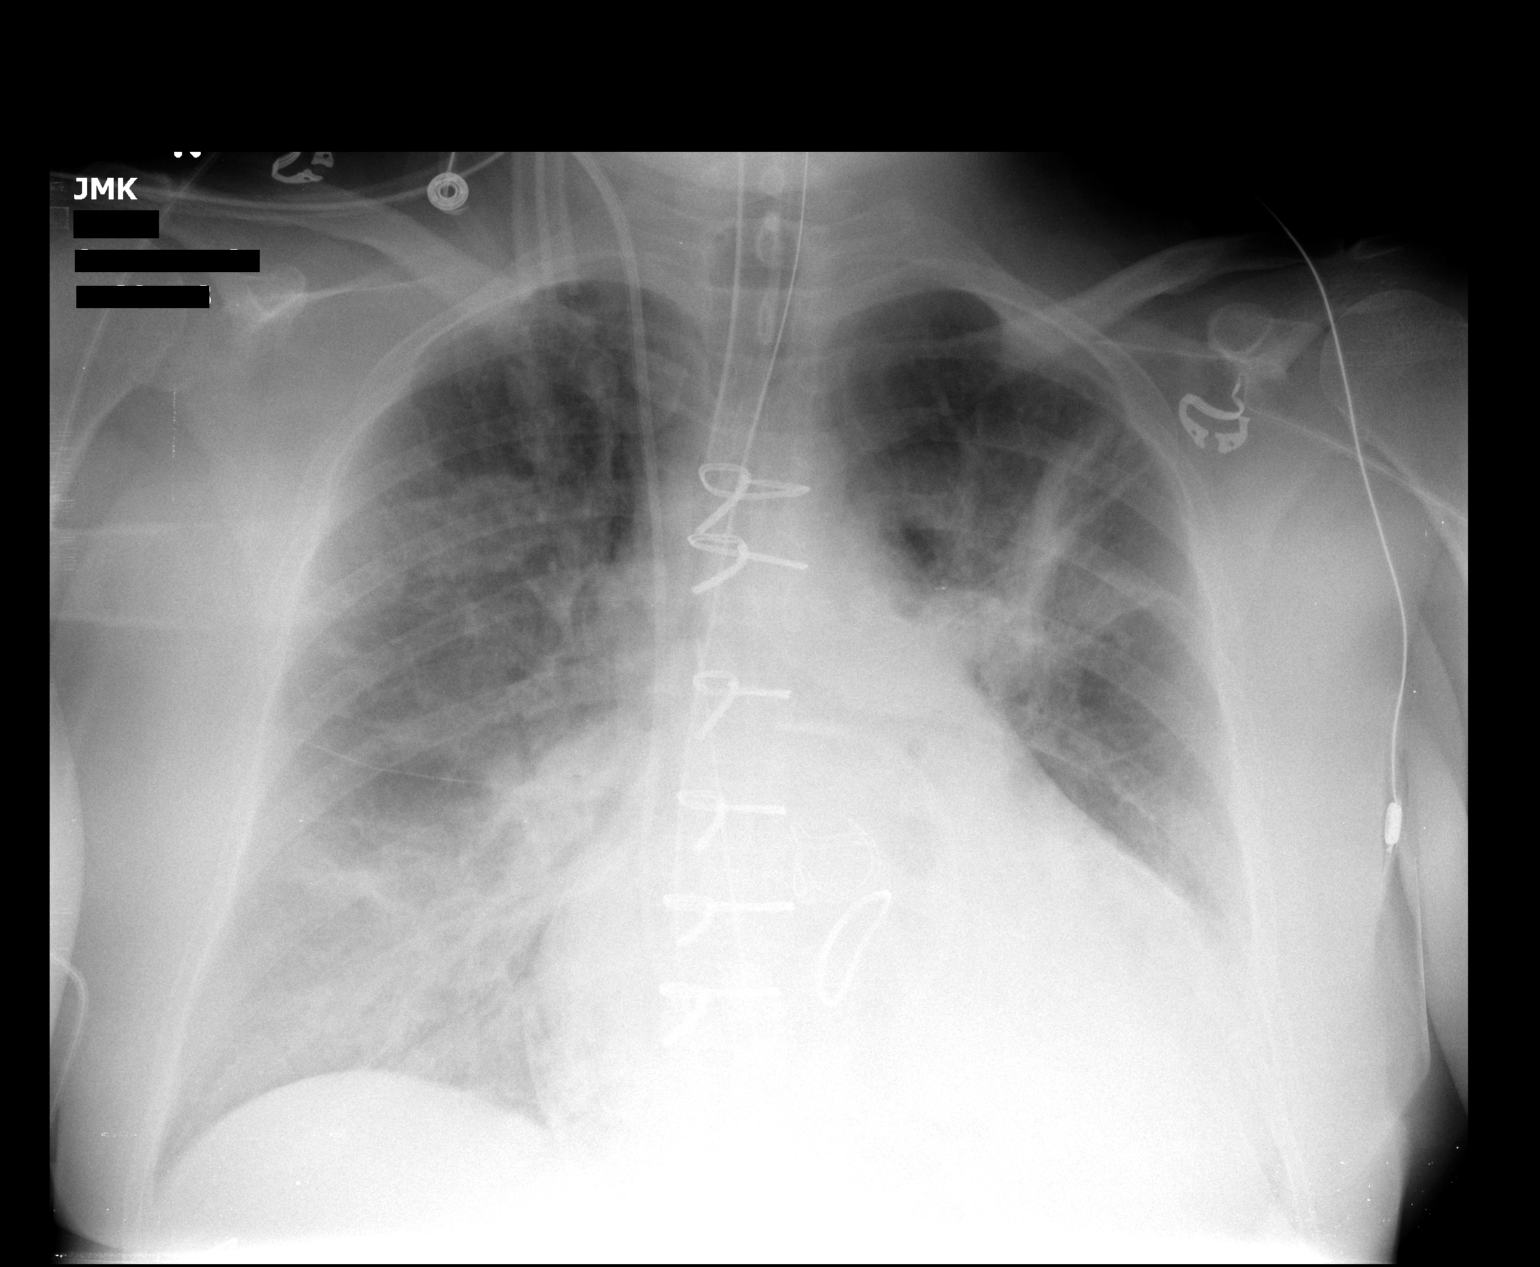

[1 of 1 positions shown; findings below may reference images not displayed]

FINDINGS: There are postoperative changes of median sternotomy for
aortic valve replacement and mitral valve repair.  Endotracheal
tube is in satisfactory position, with the tip 5.5 cm above the
carina.  Right IJ Swan-Ganz catheter terminates in the right main
pulmonary artery. Mediastinal drain terminates just to the right of
midline near the level of the carina.

Cardiomegaly is stable.  There is pulmonary vascular congestion
bilateral patchy airspace disease, and streaky bowel and areas of
streaky atelectasis.  Left costophrenic angle is blunted.  No
visible pneumothorax.  No acute bony abnormality identified.
IMPRESSION: 1. Status post median sternotomy for AVR mitral valve
repair.Satisfactory position of support devices
2.  Cardiomegaly with airspace opacities likely reflecting
pulmonary edema, and scattered areas of atelectasis.
3.  Suspect small left pleural effusion.

## 2013-09-12 IMAGING — CR DG CHEST 1V PORT
1 series · 1 of 1 positions shown · non-contrast
Comparison: 08/06/2011 and earlier.

CLINICAL DATA: 51-year-old female with chest pain, status post
cardiac surgery.  Chest tubes.

PORTABLE CHEST - 1 VIEW

[AP]
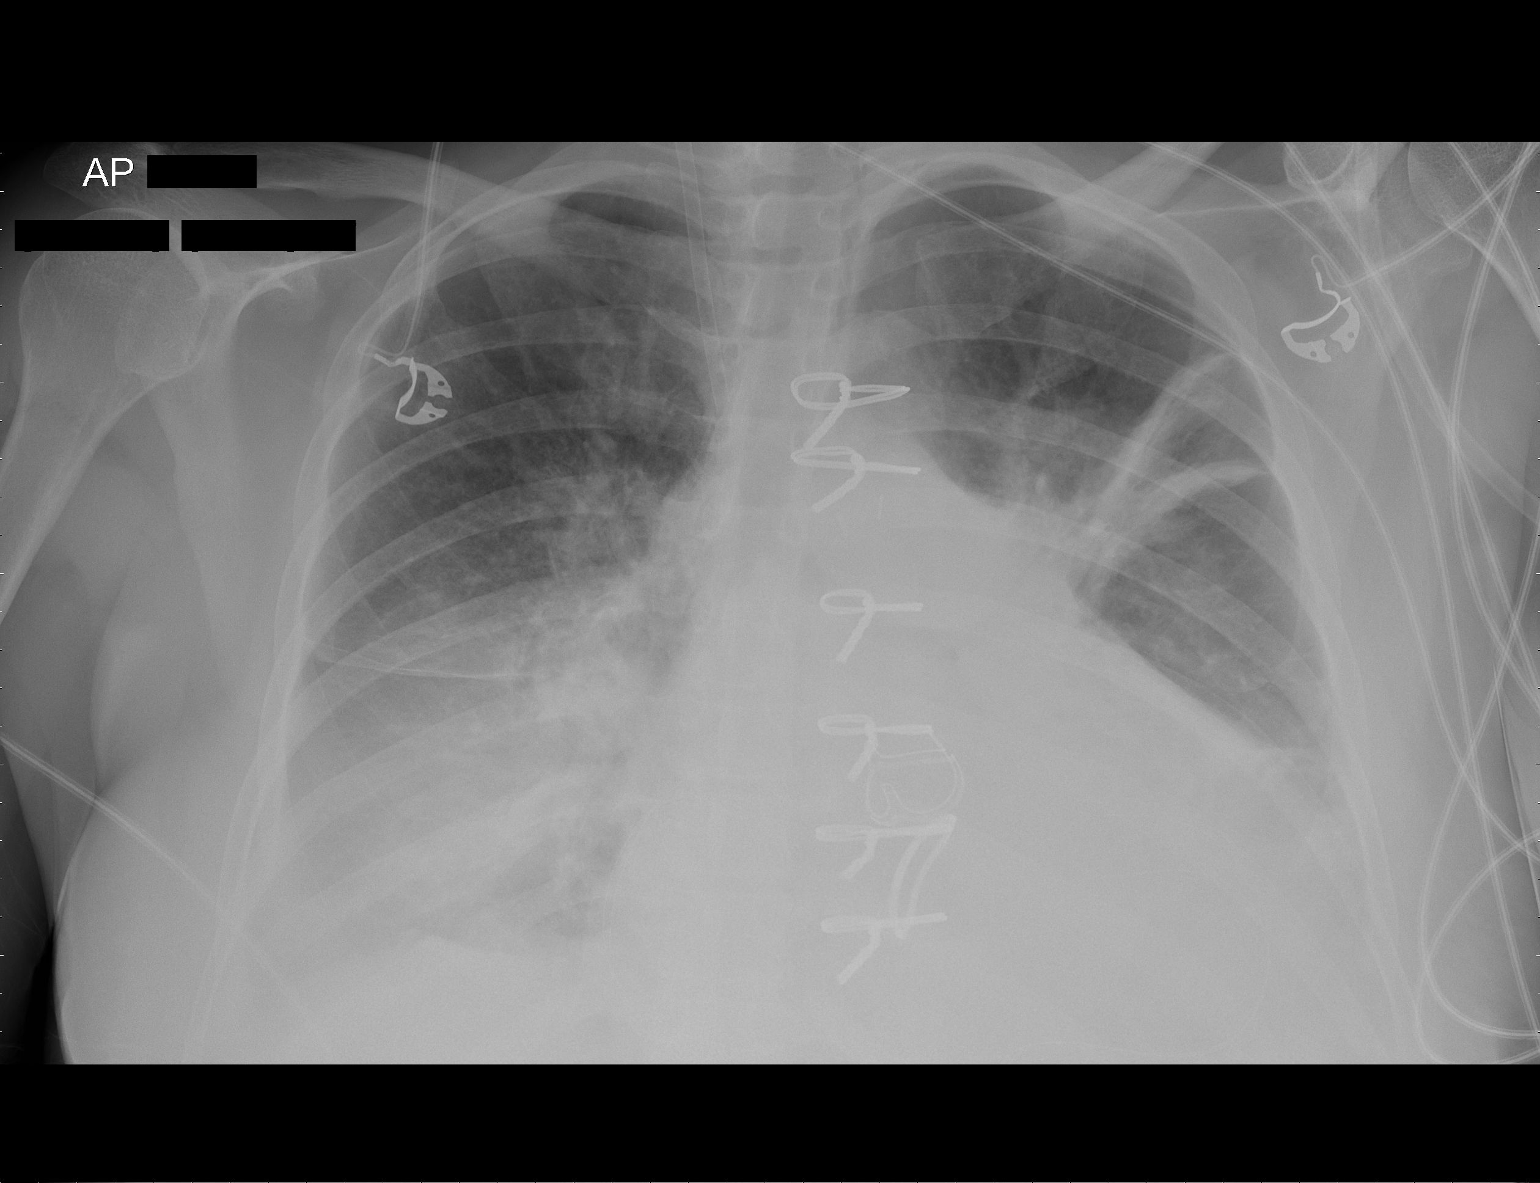

[1 of 1 positions shown; findings below may reference images not displayed]

FINDINGS: AP portable seated upright view 3582 hours.  The patient
remains extubated.  Right IJ introducer sheath remains in place.
No pneumothorax identified. Stable cardiomegaly and mediastinal
contours.  Bilateral pleural effusions.  Perihilar atelectasis.  No
pulmonary edema. Sequelae of cardiac valve replacement and median
sternotomy.
IMPRESSION: 1.  Stable right IJ introducer sheath.
2.  No pneumothorax identified.  Bilateral pleural effusions and
atelectasis.

## 2013-09-21 ENCOUNTER — Encounter: Payer: Self-pay | Admitting: Cardiology

## 2013-09-26 ENCOUNTER — Other Ambulatory Visit (HOSPITAL_COMMUNITY)
Admission: RE | Admit: 2013-09-26 | Discharge: 2013-09-26 | Disposition: A | Discharge status: Home / Self Care | Payer: Medicaid Other | Source: Ambulatory Visit | Attending: Obstetrics and Gynecology | Admitting: Obstetrics and Gynecology

## 2013-09-26 ENCOUNTER — Encounter: Payer: Self-pay | Admitting: Obstetrics and Gynecology

## 2013-09-26 ENCOUNTER — Ambulatory Visit (INDEPENDENT_AMBULATORY_CARE_PROVIDER_SITE_OTHER): Payer: Medicaid Other | Admitting: Obstetrics and Gynecology

## 2013-09-26 VITALS — BP 129/84 | HR 66 | Temp 96.9°F | Ht 65.0 in | Wt 183.3 lb

## 2013-09-26 DIAGNOSIS — N852 Hypertrophy of uterus: Secondary | ICD-10-CM

## 2013-09-26 DIAGNOSIS — Z124 Encounter for screening for malignant neoplasm of cervix: Secondary | ICD-10-CM

## 2013-09-26 DIAGNOSIS — D259 Leiomyoma of uterus, unspecified: Secondary | ICD-10-CM

## 2013-09-26 DIAGNOSIS — D219 Benign neoplasm of connective and other soft tissue, unspecified: Secondary | ICD-10-CM

## 2013-09-26 DIAGNOSIS — Z1151 Encounter for screening for human papillomavirus (HPV): Secondary | ICD-10-CM | POA: Insufficient documentation

## 2013-09-26 MED ORDER — LEUPROLIDE ACETATE (3 MONTH) 11.25 MG IM KIT
11.2500 mg | PACK | INTRAMUSCULAR | Status: AC
  Administered 2013-09-26 – 2013-12-27 (×2): 11.25 mg via INTRAMUSCULAR

## 2013-09-26 MED ORDER — OXYCODONE-ACETAMINOPHEN 5-325 MG PO TABS: 1.0000 | ORAL_TABLET | ORAL | Status: DC | PRN

## 2013-09-26 NOTE — Progress Notes (Signed)
Patient here today because she reports having fibroids for the last 30 years-- had heart surgery August 03, 2011 and reports having pelvic pain/lower back pain since then and it is now bothering her. Reports the fibroids are making it difficult for her to have a bowel movement. Reports no pain today as she took a friend's percocet. Also c/o of menopause symptoms-- night sweats and mood swings and would like to address at this visit.

## 2013-09-26 NOTE — Progress Notes (Signed)
Subjective:    Patient ID: [name], female    DOB: 06-Jan-1961, 53 y.o.   MRN: [id number]  HPI  53 yo [id number] postmenopausal for 2 years who is here for management of fibroid uterus and vasomotor symptoms. Patient reports being diagnosed with fibroid uterus 30 years ago. She reports progressive worsening in lower back and pelvic pain over the years. Her pain is worst at the time of having a bowel movement. Her pain is not relieved with tylenol or motrin and she often takes a "pain pill" from her friend. Patient also reports some mood swings, night sweats and hot flushes which occur occasionally but when they do they are quite bothersome.   Past Medical History  Diagnosis Date  . Chronic systolic heart failure   . Pulmonary nodule, left     f/u chest CT 2/12: interval clearing of RUL air space nodule, borderline enlarged mediastinal lymph nodes stable, pul. arterial HTN  . Ovarian cyst     ovarian cystic mass  . Cocaine abuse     resolved  . Alcohol abuse     resolved  . Pulmonary hypertension   . 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 Roxan Hockey)  . Mitral stenosis   . Nonischemic cardiomyopathy     a. cath 05/2011 showed a 70% D1, a 50% OM1, a 50-60% left circumflex and a 20% right coronary artery. Ejection fraction was 20%. There was mild MR and severe AI. There was mild pulmonary hypertension; b.echocardiogram 2/13 EF 20-25%, restrictive filling pattern, mod AS with mod  to severe AI. 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 mod to severe MR, mod LAE, mild RVE, mild RAE, mod TR  . Cannabis abuse   . Fibroids     abd. CT 2/12: large fibroid uterus  . Personal history of noncompliance with medical treatment, presenting hazards to health   . COPD (chronic obstructive pulmonary disease) 12/27/2008        . Anxiety   . Ventricular tachycardia     LifeVest  . Hypertension   . High cholesterol   . ICD  (implantable cardiac defibrillator) in place   . History of blood transfusion 1980's    "after tubal pregnancy"  . CHF (congestive heart failure)   . Shortness of breath     "at rest" (01/04/2012)   Past Surgical History  Procedure Laterality Date  . Tee without cardioversion  06/08/2011    Procedure: TRANSESOPHAGEAL ECHOCARDIOGRAM (TEE);  Surgeon: Lelon Perla, MD;  Location: [place];  Service: Cardiovascular;  Laterality: N/A;  . Aortic valve replacement  08/04/2011    Procedure: AORTIC VALVE REPLACEMENT (AVR);  Surgeon: Melrose Nakayama, MD;  Location: [place];  Service: Open Heart Surgery;  Laterality: N/A;  WITH ROOT ENLARGEMENT  . Mitral valve repair  08/04/2011    Procedure: MITRAL VALVE REPAIR (MVR);  Surgeon: Melrose Nakayama, MD;  Location: [place];  Service: Open Heart Surgery;  Laterality: N/A;  . Cardiac defibrillator placement  01/04/2012    MDT Protecta XT VR ICD implanted by Dr Rayann Heman  . Cardiac valve replacement    . Tonsillectomy  ~ 1972  . Ectopic pregnancy surgery  1980's  . Cardiac catheterization  2008; ~ 2011;  05/2011   Family History  Problem Relation Age of Onset  . Coronary artery disease    . Hypertension Mother   . Anesthesia problems Neg Hx  History  Substance Use Topics  . Smoking status: Former Smoker -- 1.00 packs/day for 10 years    Types: Cigarettes    Quit date: 05/27/2010  . Smokeless tobacco: Never Used  . Alcohol Use: 8.4 oz/week    14 Shots of liquor per week     Comment: drinks 1-2 shots of tequila most days     Review of Systems  All other systems reviewed and are negative.      Objective:   Physical Exam  GENERAL: [name], well-nourished female in no acute distress.  ABDOMEN: Soft, nontender, nondistended. Obese PELVIC: Normal external female genitalia. Vagina is pink and rugated.  Normal discharge. Normal appearing cervix. Uterus is 18-week in size. No adnexal mass or tenderness. EXTREMITIES: No cyanosis,  clubbing, or edema, 2+ distal pulses.       Assessment & Plan:  53 yo menopausal female with fibroid uterus and pelvic pain - Discussed definitive management with hysterectomy. Patient is very reluctant to have major surgery, in light of her recent open heart surgery. - Discussed Lupron therapy to help shrink the fibroids which may alleviate some of her symptoms. Patient desires to try that option first but understands that she may still need surgery - pap smear collected today - Pelvic ultrasound also ordered - RTC in 6 months

## 2013-09-27 LAB — CYTOLOGY - PAP

## 2013-10-01 ENCOUNTER — Ambulatory Visit (INDEPENDENT_AMBULATORY_CARE_PROVIDER_SITE_OTHER): Payer: Medicaid Other | Admitting: Family Medicine

## 2013-10-01 ENCOUNTER — Encounter: Payer: Self-pay | Admitting: Family Medicine

## 2013-10-01 VITALS — BP 109/77 | HR 63 | Temp 97.6°F | Ht 65.5 in | Wt 185.0 lb

## 2013-10-01 DIAGNOSIS — I2510 Atherosclerotic heart disease of native coronary artery without angina pectoris: Secondary | ICD-10-CM

## 2013-10-01 DIAGNOSIS — Z0000 Encounter for general adult medical examination without abnormal findings: Secondary | ICD-10-CM

## 2013-10-01 DIAGNOSIS — G4700 Insomnia, unspecified: Secondary | ICD-10-CM

## 2013-10-01 LAB — CBC WITH DIFFERENTIAL/PLATELET
BASOS ABS: 0 10*3/uL (ref 0.0–0.1)
Basophils Relative: 1 % (ref 0–1)
EOS PCT: 1 % (ref 0–5)
Eosinophils Absolute: 0 10*3/uL (ref 0.0–0.7)
HCT: 42.8 % (ref 36.0–46.0)
Hemoglobin: 14.6 g/dL (ref 12.0–15.0)
Lymphocytes Relative: 35 % (ref 12–46)
Lymphs Abs: 1.6 10*3/uL (ref 0.7–4.0)
MCH: 29.8 pg (ref 26.0–34.0)
MCHC: 34.1 g/dL (ref 30.0–36.0)
MCV: 87.3 fL (ref 78.0–100.0)
Monocytes Absolute: 0.5 10*3/uL (ref 0.1–1.0)
Monocytes Relative: 10 % (ref 3–12)
Neutro Abs: 2.4 10*3/uL (ref 1.7–7.7)
Neutrophils Relative %: 53 % (ref 43–77)
PLATELETS: 160 10*3/uL (ref 150–400)
RBC: 4.9 MIL/uL (ref 3.87–5.11)
RDW: 15.4 % (ref 11.5–15.5)
WBC: 4.6 10*3/uL (ref 4.0–10.5)

## 2013-10-01 MED ORDER — HYDROXYZINE PAMOATE 25 MG PO CAPS: 25.0000 mg | ORAL_CAPSULE | Freq: Every evening | ORAL | Status: DC | PRN

## 2013-10-01 NOTE — Assessment & Plan Note (Addendum)
Patient uncertain when tetanus was given last. Likely about 6-8 years ago. Discussed tetanus vaccination  to be given next year. Patient agreeable. Pap smears are up-to-date. Flu shot up-to-date. Will be due in August of 2015. Mammogram and colonoscopy information given today for patient to make an appointment. She is agreeable. CBC and iron panel today considering pts cardiac history. Pt has been screened for cholesterol through cardio and recent BMP in system.  Pt needs HIV and hep screen, do not see where one has been completed per CDC recommendations and she has been incarcerated in the past.

## 2013-10-01 NOTE — Patient Instructions (Signed)

## 2013-10-01 NOTE — Progress Notes (Signed)
   Subjective:    Patient ID: [name], female    DOB: May 30, 1960, 53 y.o.   MRN: [id number]  HPI [name] is a 53 y.o. female presents to family medicine clinic today for new patient establishment. Patient has extensive past medical history of polysubstance abuse, chronic systolic heart failure, pulmonary nodule left, ovarian cyst, pulmonary hypertension, aortic stenosis the mitral and aortic insufficiency and valve replacement, actual stenosis, nonischemic cardiomyopathy ejection fraction of 20%, fibroids, COPD, anxiety, hyperlipidemia, CHF and fibroids.  Insomnia: Patient has a history since her surgery of difficulty being able to fall asleep. She states when she falls sleep she can only sleep for about an hour had a half. She states she does not sleep with the TV on. She feels she has a good sleep environment is quiet and dark. She feels that it possibly anxiety that causes her not to be able to sleep, she states that she cannot get her brain to stop thinking.  Health maintenance: Patient states she believes her tetanus shot was about 6 years ago. She is up-to-date on her flu vaccination. She had a recent Pap smear this past week at [facility] hospital. She has a history of fibroids that she has received one Lupron injection. She is to return in 3 month for an additional injection, then followed up in December to decide if she needs a hysterectomy. Patient is overdue for colonoscopy and mammogram.  Cardiac history: Patient follows regularly with Dr. Stanford Breed, approximately every 3 months, last visit May 2015. In addition she sees Dr. Haydee Monica for her pacemaker, every 3 months to have pacemaker charged and office visit every year with Dr. Haydee Monica. All cardiac medications are refilled by cardiology.  Family history: Mother: Hypertension, heart disease, COPD, cholecystitis, death from heart attack at age 56 Paternal grandmother: Heart disease, death from heart attack at age  51 Brother: Death from esophageal cancer.  Surgical history: Please see above and right nephrectomy and salpingectomy. Patient uncertain if she has her left fallopian tube/ovary  Social history: Single. Monogamous relationship for 62 years with female partner. Gravida 2 para 0 (2 tubal pregnancies). Former smoker quit in 2012. Employed. History of polysubstance abuse. Current alcohol consumption.  Review of Systems Negative, with the exception of above mentioned in HPI    Objective:   Physical Exam BP 109/77  Pulse 63  Temp(Src) 97.6 F (36.4 C) (Oral)  Ht 5' 5.5" (1.664 m)  Wt 185 lb (83.915 kg)  BMI 30.31 kg/m2  LMP 08/04/2011 Gen: Pleasant, African American female, overweight. No acute distress nontoxic in appearance HEENT: AT. [place]. Bilateral TM visualized and normal in appearance. Bilateral eyes without injections or icterus. MMM. Bilateral nares no erythema or swelling. Throat without erythema or exudates.  CV: RRR, 3/6 systolic murmur. No clicks gallops or rubs Chest: CTAB, no wheeze or crackles Abd: Soft. Round NTND. BS present. No Masses palpated.  Ext: No erythema. +1 edema. No tenderness Skin: No rashes, purpura or petechiae.  Neuro:  Normal gait. PERLA. EOMi. Alert. Grossly intact.  Psych: Normal dress, affect, demeanor and speech

## 2013-10-01 NOTE — Assessment & Plan Note (Signed)
Patient with a history of anxiety, seasonal allergies and insomnia. I have asked her to discontinue the Zyrtec and have started her on Vistaril to be taken nightly before bedtime. Followup 4 weeks

## 2013-10-02 LAB — FERRITIN: Ferritin: 96 ng/mL (ref 10–291)

## 2013-10-02 LAB — IBC PANEL
%SAT: 19 % — AB (ref 20–55)
TIBC: 384 ug/dL (ref 250–470)
UIBC: 310 ug/dL (ref 125–400)

## 2013-10-02 LAB — IRON: Iron: 74 ug/dL (ref 42–145)

## 2013-10-03 ENCOUNTER — Encounter: Payer: Self-pay | Admitting: Family Medicine

## 2013-10-05 ENCOUNTER — Ambulatory Visit (HOSPITAL_COMMUNITY)
Admission: RE | Admit: 2013-10-05 | Discharge: 2013-10-05 | Disposition: A | Discharge status: Home / Self Care | Payer: Medicaid Other | Source: Ambulatory Visit | Attending: Obstetrics and Gynecology | Admitting: Obstetrics and Gynecology

## 2013-10-05 DIAGNOSIS — D259 Leiomyoma of uterus, unspecified: Secondary | ICD-10-CM | POA: Diagnosis not present

## 2013-10-05 DIAGNOSIS — N852 Hypertrophy of uterus: Secondary | ICD-10-CM

## 2013-10-05 DIAGNOSIS — D219 Benign neoplasm of connective and other soft tissue, unspecified: Secondary | ICD-10-CM

## 2013-10-27 ENCOUNTER — Other Ambulatory Visit: Payer: Self-pay | Admitting: Internal Medicine

## 2013-11-19 ENCOUNTER — Ambulatory Visit (INDEPENDENT_AMBULATORY_CARE_PROVIDER_SITE_OTHER): Payer: Medicaid Other | Admitting: *Deleted

## 2013-11-19 DIAGNOSIS — Z95810 Presence of automatic (implantable) cardiac defibrillator: Secondary | ICD-10-CM

## 2013-11-19 DIAGNOSIS — I428 Other cardiomyopathies: Secondary | ICD-10-CM

## 2013-11-19 DIAGNOSIS — I519 Heart disease, unspecified: Secondary | ICD-10-CM

## 2013-11-19 LAB — MDC_IDC_ENUM_SESS_TYPE_INCLINIC
Brady Statistic RV Percent Paced: 0.77 %
HIGH POWER IMPEDANCE MEASURED VALUE: 19 Ohm
HighPow Impedance: 399 Ohm
HighPow Impedance: 69 Ohm
Lead Channel Impedance Value: 380 Ohm
Lead Channel Pacing Threshold Pulse Width: 0.4 ms
Lead Channel Sensing Intrinsic Amplitude: 17.875 mV
Lead Channel Setting Pacing Amplitude: 2.5 V
Lead Channel Setting Pacing Pulse Width: 0.4 ms
MDC IDC MSMT BATTERY VOLTAGE: 3.17 V
MDC IDC MSMT LEADCHNL RV PACING THRESHOLD AMPLITUDE: 0.875 V
MDC IDC MSMT LEADCHNL RV SENSING INTR AMPL: 18.625 mV
MDC IDC SESS DTM: 20150803160557
MDC IDC SET LEADCHNL RV SENSING SENSITIVITY: 0.3 mV
MDC IDC SET ZONE DETECTION INTERVAL: 280 ms
MDC IDC SET ZONE DETECTION INTERVAL: 360 ms
Zone Setting Detection Interval: 350 ms

## 2013-11-19 NOTE — Progress Notes (Signed)
ICD check in clinic. Normal device function. Thresholds and sensing consistent with previous device measurements. Impedance trends stable over time. 2 NSVT---longest 29 beats @235bpm . 1 monitored VT---7 sec @188bpm . Histogram distribution appropriate for patient and level of activity. OptiVol up 5/28--09/15/13. No changes made this session. Device programmed at appropriate safety margins. Device programmed to optimize intrinsic conduction. Battery @3 .17V. Alert tones/vibration demonstrated for patient, pt knows to call clinic if heard. ROV w/ Dr. Rayann Heman 03/04/14.

## 2013-11-25 ENCOUNTER — Other Ambulatory Visit: Payer: Self-pay | Admitting: Internal Medicine

## 2013-11-26 ENCOUNTER — Other Ambulatory Visit: Payer: Self-pay | Admitting: Internal Medicine

## 2013-12-04 ENCOUNTER — Encounter: Payer: Self-pay | Admitting: Internal Medicine

## 2013-12-26 ENCOUNTER — Other Ambulatory Visit: Payer: Self-pay | Admitting: Internal Medicine

## 2013-12-26 ENCOUNTER — Ambulatory Visit: Payer: Medicaid Other

## 2013-12-27 ENCOUNTER — Ambulatory Visit (INDEPENDENT_AMBULATORY_CARE_PROVIDER_SITE_OTHER): Payer: Medicaid Other

## 2013-12-27 VITALS — BP 115/81 | HR 65 | Temp 98.0°F | Wt 180.5 lb

## 2013-12-27 DIAGNOSIS — D259 Leiomyoma of uterus, unspecified: Secondary | ICD-10-CM

## 2013-12-27 DIAGNOSIS — R1900 Intra-abdominal and pelvic swelling, mass and lump, unspecified site: Secondary | ICD-10-CM

## 2013-12-27 MED ORDER — OXYCODONE-ACETAMINOPHEN 5-325 MG PO TABS: 1.0000 | ORAL_TABLET | ORAL | Status: DC | PRN

## 2013-12-27 NOTE — Progress Notes (Signed)
Patient here today for second dose pf Depo Lupron. 11.25mg  Lupron administered into RUO quadrant of buttocks. Patient tolerated well. Patient to return at the end of November-- beginning of December 2015 for visit with MD for follow up. Patient requests refill of Percocet. Consulted Dr. Elly Modena who agreed to refill patient's RX. Patient informed and paper RX given. NO further questions or concerns.

## 2014-01-01 NOTE — Progress Notes (Signed)
Patient ID: [name], female   DOB: 13-Sep-1960, 53 y.o.   MRN: [id number] Agree with nurses's documentation of this patient's clinic encounter.  Mora Bellman, MD

## 2014-02-10 IMAGING — CR DG CHEST 2V
2 series · 2 of 2 positions shown · non-contrast
Comparison: Chest x-ray of 10/05/2011

***ADDENDUM*** CREATED: 01/05/2012 [DATE]

The patient has a defibrillator with AICD lead.
***END ADDENDUM*** SIGNED BY: Aysecan Ayar, M.D.
CLINICAL DATA: Post pacemaker insertion
CHEST - 2 VIEW

[w chest pa]
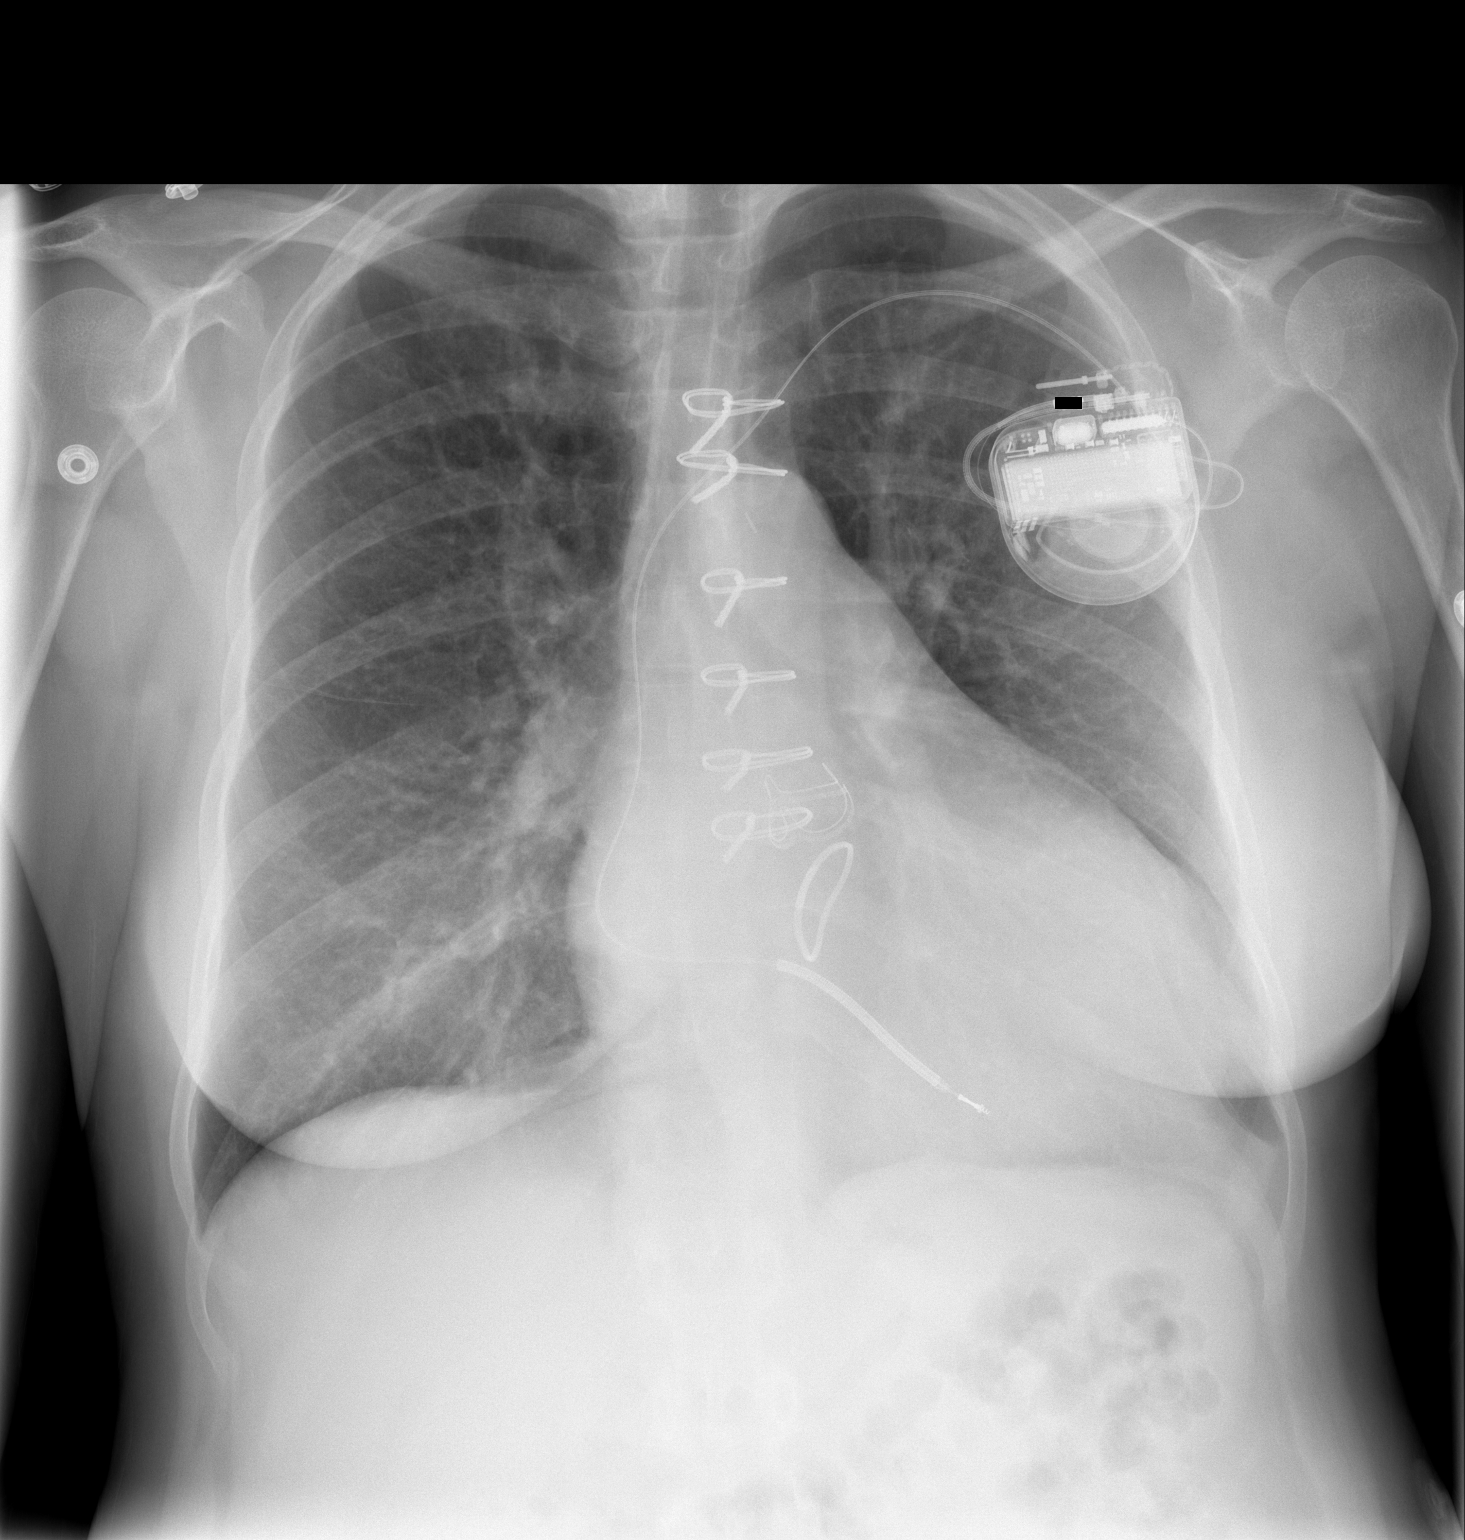

[w chest lat]
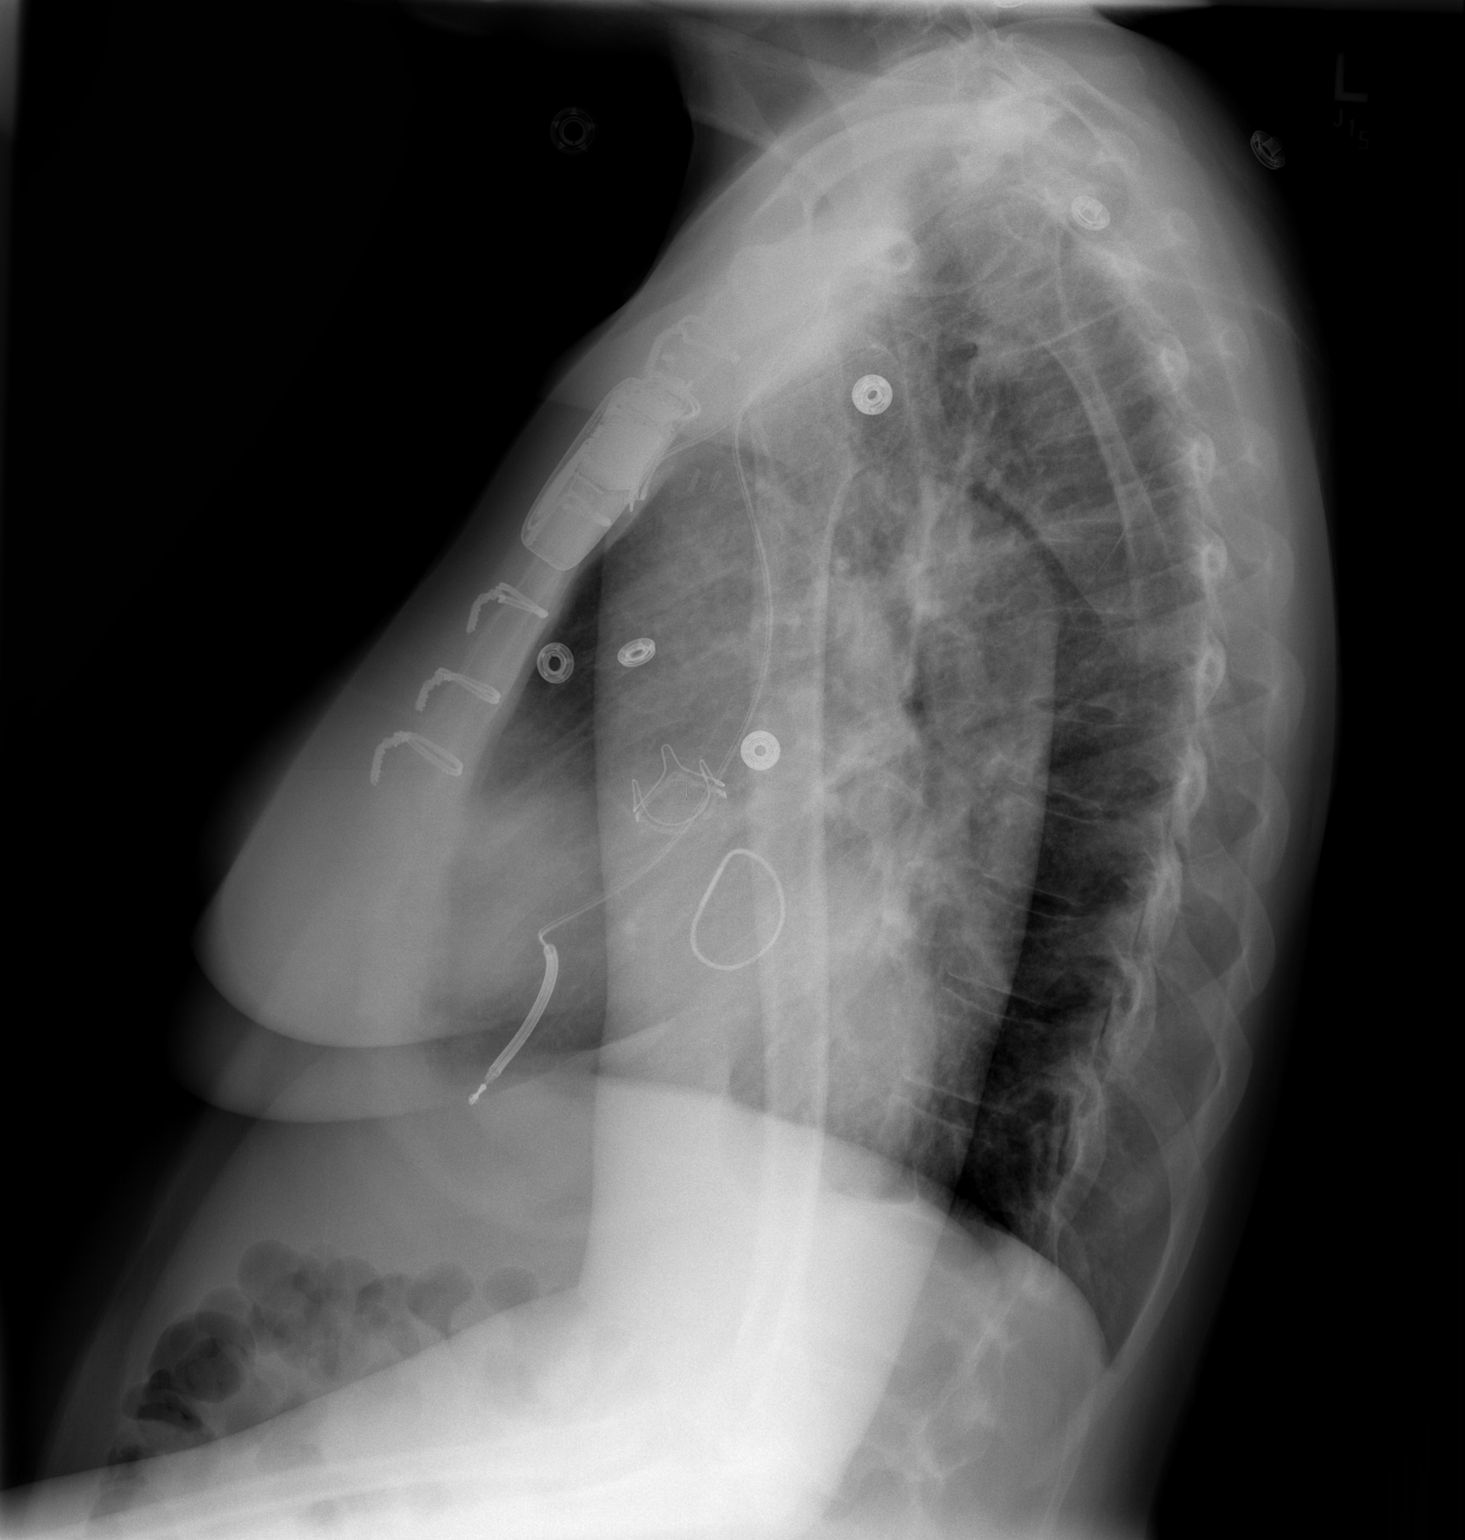

[2 of 2 positions shown; findings below may reference images not displayed]

FINDINGS: A permanent pacer maker is now present with AICD lead.
Cardiomegaly is stable.  No pleural effusion is seen.  Median
sternotomy sutures are noted from prior aortic valve replacement.
IMPRESSION: Permanent pacemaker now present with AICD lead.  Stable
cardiomegaly.

## 2014-02-21 ENCOUNTER — Ambulatory Visit: Payer: Medicaid Other | Admitting: Cardiology

## 2014-03-03 ENCOUNTER — Other Ambulatory Visit: Payer: Self-pay | Admitting: Internal Medicine

## 2014-03-04 ENCOUNTER — Ambulatory Visit (INDEPENDENT_AMBULATORY_CARE_PROVIDER_SITE_OTHER): Payer: Medicaid Other | Admitting: Internal Medicine

## 2014-03-04 ENCOUNTER — Encounter: Payer: Self-pay | Admitting: Internal Medicine

## 2014-03-04 VITALS — BP 137/78 | HR 68 | Ht 66.0 in | Wt 181.2 lb

## 2014-03-04 DIAGNOSIS — I519 Heart disease, unspecified: Secondary | ICD-10-CM

## 2014-03-04 DIAGNOSIS — I429 Cardiomyopathy, unspecified: Secondary | ICD-10-CM

## 2014-03-04 DIAGNOSIS — I428 Other cardiomyopathies: Secondary | ICD-10-CM

## 2014-03-04 LAB — MDC_IDC_ENUM_SESS_TYPE_INCLINIC
Battery Voltage: 3.16 V
Brady Statistic RV Percent Paced: 1.27 %
HIGH POWER IMPEDANCE MEASURED VALUE: 19 Ohm
HighPow Impedance: 380 Ohm
HighPow Impedance: 68 Ohm
Lead Channel Impedance Value: 380 Ohm
Lead Channel Pacing Threshold Pulse Width: 0.4 ms
Lead Channel Sensing Intrinsic Amplitude: 15.875 mV
Lead Channel Setting Pacing Amplitude: 2.5 V
Lead Channel Setting Pacing Pulse Width: 0.4 ms
Lead Channel Setting Sensing Sensitivity: 0.3 mV
MDC IDC MSMT LEADCHNL RV PACING THRESHOLD AMPLITUDE: 0.875 V
MDC IDC MSMT LEADCHNL RV SENSING INTR AMPL: 16.125 mV
MDC IDC SESS DTM: 20151116193254
MDC IDC SET ZONE DETECTION INTERVAL: 360 ms
Zone Setting Detection Interval: 280 ms
Zone Setting Detection Interval: 350 ms

## 2014-03-04 MED ORDER — FUROSEMIDE 40 MG PO TABS: ORAL_TABLET | ORAL | Status: DC

## 2014-03-04 MED ORDER — LISINOPRIL 2.5 MG PO TABS: 5.0000 mg | ORAL_TABLET | Freq: Every day | ORAL | Status: DC

## 2014-03-04 MED ORDER — LISINOPRIL 5 MG PO TABS: 5.0000 mg | ORAL_TABLET | Freq: Every day | ORAL | Status: DC

## 2014-03-04 NOTE — Progress Notes (Signed)
HPI: FU AVR secondary to aortic stenosis and CAD. Cardiac catheterization in February of 2012 showed a 70% first diagonal, a 50% first marginal, a 50-60% left circumflex and a 20% right coronary artery. Ejection fraction was 20%. There was mild mitral regurgitation and severe aortic insufficiency. There was mild pulmonary hypertension. Patient had AVR 15mm Edwards Magna Ease Pericardial Valve Conduit Aortic Root Enlargement utilizing a Hemashield Graft; MV Annuloplasty utilizing 66mm Edwards Physio II Ring in April of 2013. Had ICD placed. Last echo 5/15 showed EF 15-20, restrictive filling, AVR with mean gradient of 9 mmHg, mild MS and MR, biatrial enlargement, moderate TR and PI. Since last seen,   Current Outpatient Prescriptions  Medication Sig Dispense Refill  . aspirin EC 325 MG tablet Take 325 mg by mouth daily.    Marland Kitchen atorvastatin (LIPITOR) 80 MG tablet Take 1 tablet (80 mg total) by mouth daily. 90 tablet 3  . carvedilol (COREG) 12.5 MG tablet Take 1 tablet (12.5 mg total) by mouth 2 (two) times daily with a meal. 60 tablet 12  . cetirizine (ZYRTEC) 10 MG tablet Take 10 mg by mouth daily.    . furosemide (LASIX) 40 MG tablet TAKE TWO TABLETS BY MOUTH TWICE DAILY 120 tablet 1  . hydrOXYzine (VISTARIL) 25 MG capsule Take 1 capsule (25 mg total) by mouth at bedtime as needed. 30 capsule 3  . KLOR-CON M20 20 MEQ tablet TAKE ONE  BY MOUTH TWICE DAILY 60 tablet 6  . lisinopril (PRINIVIL,ZESTRIL) 2.5 MG tablet TAKE ONE TABLET BY MOUTH ONCE DAILY 30 tablet 10  . oxyCODONE-acetaminophen (PERCOCET/ROXICET) 5-325 MG per tablet Take 1 tablet by mouth every 4 (four) hours as needed. 30 tablet 0  . [DISCONTINUED] budesonide-formoterol (SYMBICORT) 160-4.5 MCG/ACT inhaler Inhale 2 puffs into the lungs 2 (two) times daily. 1 Inhaler 0  . [DISCONTINUED] tiotropium (SPIRIVA) 18 MCG inhalation capsule Place 1 capsule (18 mcg total) into inhaler and inhale daily. 30 capsule 0   Current  Facility-Administered Medications  Medication Dose Route Frequency Provider Last Rate Last Dose  . leuprolide (LUPRON) injection 11.25 mg  11.25 mg Intramuscular Q90 days Mora Bellman, MD   11.25 mg at 12/27/13 1430     Past Medical History  Diagnosis Date  . Chronic systolic heart failure   . Pulmonary nodule, left     f/u chest CT 2/12: interval clearing of RUL air space nodule, borderline enlarged mediastinal lymph nodes stable, pul. arterial HTN  . Ovarian cyst     ovarian cystic mass  . Cocaine abuse     resolved  . Alcohol abuse     resolved  . Pulmonary hypertension   . 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 Roxan Hockey)  . Mitral stenosis   . Nonischemic cardiomyopathy     a. cath 05/2011 showed a 70% D1, a 50% OM1, a 50-60% left circumflex and a 20% right coronary artery. Ejection fraction was 20%. There was mild MR and severe AI. There was mild pulmonary hypertension; b.echocardiogram 2/13 EF 20-25%, restrictive filling pattern, mod AS with mod  to severe AI. 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 mod to severe MR, mod LAE, mild RVE, mild RAE, mod TR  . Cannabis abuse   . Fibroids     abd. CT 2/12: large fibroid uterus  . Personal history of noncompliance with medical treatment, presenting hazards to health   . COPD (chronic obstructive  pulmonary disease) 12/27/2008        . Anxiety   . Ventricular tachycardia     LifeVest  . Hypertension   . High cholesterol   . ICD (implantable cardiac defibrillator) in place   . History of blood transfusion 1980's    "after tubal pregnancy"  . CHF (congestive heart failure)   . Shortness of breath     "at rest" (01/04/2012)    Past Surgical History  Procedure Laterality Date  . Tee without cardioversion  06/08/2011    Procedure: TRANSESOPHAGEAL ECHOCARDIOGRAM (TEE);  Surgeon: Lelon Perla, MD;  Location: [place];  Service: Cardiovascular;  Laterality:  N/A;  . Aortic valve replacement  08/04/2011    Procedure: AORTIC VALVE REPLACEMENT (AVR);  Surgeon: Melrose Nakayama, MD;  Location: [place];  Service: Open Heart Surgery;  Laterality: N/A;  WITH ROOT ENLARGEMENT  . Mitral valve repair  08/04/2011    Procedure: MITRAL VALVE REPAIR (MVR);  Surgeon: Melrose Nakayama, MD;  Location: [place];  Service: Open Heart Surgery;  Laterality: N/A;  . Cardiac defibrillator placement  01/04/2012    MDT Protecta XT VR ICD implanted by Dr Rayann Heman  . Cardiac valve replacement    . Tonsillectomy  ~ 1972  . Ectopic pregnancy surgery  1980's  . Cardiac catheterization  2008; ~ 2011;  05/2011    History   Social History  . Marital Status: Single    Spouse Name: N/A    Number of Children: N/A  . Years of Education: N/A   Occupational History  . Not on file.   Social History Main Topics  . Smoking status: Former Smoker -- 1.00 packs/day for 10 years    Types: Cigarettes    Quit date: 05/27/2010  . Smokeless tobacco: Never Used  . Alcohol Use: 8.4 oz/week    14 Shots of liquor per week     Comment: drinks 1-2 shots of tequila most days  . Drug Use: Yes    Special: Marijuana, Cocaine     Comment: occasional marijuana; no longer using cocaine  . Sexual Activity: Yes   Other Topics Concern  . Not on file   Social History Narrative   Single no children   Tobacco Use - Hx of x-20 years   History Cocaine and [place]iguana use    ROS: no fevers or chills, productive cough, hemoptysis, dysphasia, odynophagia, melena, hematochezia, dysuria, hematuria, rash, seizure activity, orthopnea, PND, pedal edema, claudication. Remaining systems are negative.  Physical Exam: [name] well-nourished in no acute distress.  Skin is warm and dry.  HEENT is normal.  Neck is supple.  Chest is clear to auscultation with normal expansion.  Cardiovascular exam is regular rate and rhythm.  Abdominal exam nontender or distended. No masses palpated. Extremities  show no edema. neuro grossly intact  ECG     This encounter was created in error - please disregard.

## 2014-03-04 NOTE — Patient Instructions (Addendum)
Your physician recommends that you schedule a follow-up appointment in: 3 months with the [facility] and in 12 months with Dr.Allred   Your physician has recommended you make the following change in your medication:  1) Increase Lisinopril to 5mg  daily
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
## 2014-03-07 ENCOUNTER — Encounter: Payer: Medicaid Other | Admitting: Cardiology

## 2014-03-08 ENCOUNTER — Ambulatory Visit (INDEPENDENT_AMBULATORY_CARE_PROVIDER_SITE_OTHER): Payer: Medicaid Other | Admitting: Cardiology

## 2014-03-08 ENCOUNTER — Encounter: Payer: Self-pay | Admitting: Cardiology

## 2014-03-08 DIAGNOSIS — I429 Cardiomyopathy, unspecified: Secondary | ICD-10-CM

## 2014-03-08 DIAGNOSIS — I2510 Atherosclerotic heart disease of native coronary artery without angina pectoris: Secondary | ICD-10-CM

## 2014-03-08 DIAGNOSIS — I10 Essential (primary) hypertension: Secondary | ICD-10-CM

## 2014-03-08 DIAGNOSIS — Z954 Presence of other heart-valve replacement: Secondary | ICD-10-CM

## 2014-03-08 DIAGNOSIS — I519 Heart disease, unspecified: Secondary | ICD-10-CM

## 2014-03-08 DIAGNOSIS — Z952 Presence of prosthetic heart valve: Secondary | ICD-10-CM

## 2014-03-08 DIAGNOSIS — I428 Other cardiomyopathies: Secondary | ICD-10-CM

## 2014-03-08 MED ORDER — SPIRONOLACTONE 25 MG PO TABS: 25.0000 mg | ORAL_TABLET | Freq: Every day | ORAL | Status: DC

## 2014-03-08 NOTE — Patient Instructions (Signed)
Your physician recommends that you schedule a follow-up appointment in: [place]  Your physician recommends that you schedule a follow-up appointment in: [place]  START SPIRONOLACTONE 25 MG ONCE DAILY  Your physician recommends that you return for lab work in: [place]

## 2014-03-08 NOTE — Assessment & Plan Note (Signed)
Followed by electrophysiology. 

## 2014-03-08 NOTE — Assessment & Plan Note (Signed)
Patient complains of increased dyspnea. Add spironolactone 25 mg daily as outlined under cardiomyopathy.

## 2014-03-08 NOTE — Assessment & Plan Note (Signed)
Continue ACE inhibitor and beta blocker. She is complaining of increased dyspnea. Add spironolactone 25 mg daily. Check potassium, renal function and BNP in 1 week. I will have her follow-up with a physician's assistant in 2 weeks to make sure she is improving. Increase lisinopril at that time if able.

## 2014-03-08 NOTE — Assessment & Plan Note (Signed)
Diastolic blood pressure is mildly elevated.I have asked her to track this at home and we will increase her lisinopril as needed.

## 2014-03-08 NOTE — Progress Notes (Signed)
HPI: FU AVR secondary to aortic stenosis and CAD. Repeat echocardiogram 2/13 showed an ejection fraction of 20-25%, restrictive filling pattern, moderate aortic stenosis with moderate to severe aortic insufficiency. 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 There was moderate to severe mitral regurgitation. There was moderate left atrial enlargement, mild right ventricular and right atrial enlargement and moderate tricuspid regurgitation. Cardiac catheterization in February of 2012 showed a 70% first diagonal, a 50% first marginal, a 50-60% left circumflex and a 20% right coronary artery. Ejection fraction was 20%. There was mild mitral regurgitation and severe aortic insufficiency. There was mild pulmonary hypertension. Patient had AVR 24mm Edwards Magna Ease Pericardial Valve Conduit Aortic Root Enlargement utilizing a Hemashield Graft; MV Annuloplasty utilizing 84mm Edwards Physio II Ring in April of 2013. Had ICD placed. Last echocardiogram May 2015 showed an ejection fraction of 15-20%. There was grade 3 diastolic dysfunction. There is a prosthetic aortic valve with mean gradient 9 mmHg. The mean gradient across the mitral valve was 5 mmHg and there was mild mitral regurgitation. Moderate to severe left atrial enlargement. Moderate right atrial enlargement and mild reduction in RV function. Moderate tricuspid and pulmonic insufficiency. Since she was last seen, She notes some dyspnea on exertion. No orthopnea, PND, pedal edema, chest pain or syncope.  Current Outpatient Prescriptions  Medication Sig Dispense Refill  . aspirin EC 325 MG tablet Take 325 mg by mouth daily.    Marland Kitchen atorvastatin (LIPITOR) 80 MG tablet Take 1 tablet (80 mg total) by mouth daily. 90 tablet 3  . carvedilol (COREG) 12.5 MG tablet Take 1 tablet (12.5 mg total) by mouth 2 (two) times daily with a meal. 60 tablet 12  . furosemide (LASIX) 40 MG tablet TAKE TWO TABLETS BY MOUTH TWICE DAILY 120 tablet 1  . KLOR-CON M20 20 MEQ  tablet TAKE ONE  BY MOUTH TWICE DAILY 60 tablet 6  . lisinopril (PRINIVIL,ZESTRIL) 5 MG tablet Take 1 tablet (5 mg total) by mouth daily. 90 tablet 3  . [DISCONTINUED] budesonide-formoterol (SYMBICORT) 160-4.5 MCG/ACT inhaler Inhale 2 puffs into the lungs 2 (two) times daily. 1 Inhaler 0  . [DISCONTINUED] tiotropium (SPIRIVA) 18 MCG inhalation capsule Place 1 capsule (18 mcg total) into inhaler and inhale daily. 30 capsule 0   Current Facility-Administered Medications  Medication Dose Route Frequency Provider Last Rate Last Dose  . leuprolide (LUPRON) injection 11.25 mg  11.25 mg Intramuscular Q90 days Mora Bellman, MD   11.25 mg at 12/27/13 1430     Past Medical History  Diagnosis Date  . Chronic systolic heart failure   . Pulmonary nodule, left     f/u chest CT 2/12: interval clearing of RUL air space nodule, borderline enlarged mediastinal lymph nodes stable, pul. arterial HTN  . Ovarian cyst     ovarian cystic mass  . Cocaine abuse     resolved  . Alcohol abuse     resolved  . Pulmonary hypertension   . 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 Roxan Hockey)  . Mitral stenosis   . Nonischemic cardiomyopathy     a. cath 05/2011 showed a 70% D1, a 50% OM1, a 50-60% left circumflex and a 20% right coronary artery. Ejection fraction was 20%. There was mild MR and severe AI. There was mild pulmonary hypertension; b.echocardiogram 2/13 EF 20-25%, restrictive filling pattern, mod AS with mod  to severe AI. 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  mod to severe MR, mod LAE, mild RVE, mild RAE, mod TR  . Cannabis abuse   . Fibroids     abd. CT 2/12: large fibroid uterus  . Personal history of noncompliance with medical treatment, presenting hazards to health   . COPD (chronic obstructive pulmonary disease) 12/27/2008        . Anxiety   . Ventricular tachycardia     LifeVest  . Hypertension   . High cholesterol   . ICD  (implantable cardiac defibrillator) in place   . History of blood transfusion 1980's    "after tubal pregnancy"  . CHF (congestive heart failure)   . Shortness of breath     "at rest" (01/04/2012)    Past Surgical History  Procedure Laterality Date  . Tee without cardioversion  06/08/2011    Procedure: TRANSESOPHAGEAL ECHOCARDIOGRAM (TEE);  Surgeon: Lelon Perla, MD;  Location: [place];  Service: Cardiovascular;  Laterality: N/A;  . Aortic valve replacement  08/04/2011    Procedure: AORTIC VALVE REPLACEMENT (AVR);  Surgeon: Melrose Nakayama, MD;  Location: [place];  Service: Open Heart Surgery;  Laterality: N/A;  WITH ROOT ENLARGEMENT  . Mitral valve repair  08/04/2011    Procedure: MITRAL VALVE REPAIR (MVR);  Surgeon: Melrose Nakayama, MD;  Location: [place];  Service: Open Heart Surgery;  Laterality: N/A;  . Cardiac defibrillator placement  01/04/2012    MDT Protecta XT VR ICD implanted by Dr Rayann Heman  . Cardiac valve replacement    . Tonsillectomy  ~ 1972  . Ectopic pregnancy surgery  1980's  . Cardiac catheterization  2008; ~ 2011;  05/2011    History   Social History  . Marital Status: Single    Spouse Name: N/A    Number of Children: N/A  . Years of Education: N/A   Occupational History  . Not on file.   Social History Main Topics  . Smoking status: Former Smoker -- 1.00 packs/day for 10 years    Types: Cigarettes    Quit date: 05/27/2010  . Smokeless tobacco: Never Used  . Alcohol Use: 8.4 oz/week    14 Shots of liquor per week     Comment: drinks 1-2 shots of tequila most days  . Drug Use: Yes    Special: Marijuana, Cocaine     Comment: occasional marijuana; no longer using cocaine  . Sexual Activity: Yes   Other Topics Concern  . Not on file   Social History Narrative   Single no children   Tobacco Use - Hx of x-20 years   History Cocaine and Mariguana use    ROS: no fevers or chills, productive cough, hemoptysis, dysphasia, odynophagia, melena,  hematochezia, dysuria, hematuria, rash, seizure activity, orthopnea, PND, pedal edema, claudication. Remaining systems are negative.  Physical Exam: [name] well-nourished in no acute distress.  Skin is warm and dry.  HEENT is normal.  Neck is supple.  Chest is clear to auscultation with normal expansion.  Cardiovascular exam is regular rate and rhythm.  Abdominal exam nontender or distended. No masses palpated. Extremities show no edema. neuro grossly intact  ECG Sinus rhythm with occasional PVC. Normal axis. Inferior lateral T-wave inversion.

## 2014-03-08 NOTE — Assessment & Plan Note (Signed)
Continue SBE prophylaxis. 

## 2014-03-08 NOTE — Assessment & Plan Note (Signed)
Continue aspirin and statin. 

## 2014-03-25 ENCOUNTER — Other Ambulatory Visit: Payer: Self-pay | Admitting: *Deleted

## 2014-03-25 ENCOUNTER — Ambulatory Visit (INDEPENDENT_AMBULATORY_CARE_PROVIDER_SITE_OTHER): Payer: Medicaid Other | Admitting: Cardiology

## 2014-03-25 ENCOUNTER — Encounter: Payer: Self-pay | Admitting: Cardiology

## 2014-03-25 VITALS — BP 108/70 | HR 64 | Ht 65.5 in | Wt 182.0 lb

## 2014-03-25 DIAGNOSIS — I5022 Chronic systolic (congestive) heart failure: Secondary | ICD-10-CM

## 2014-03-25 MED ORDER — POTASSIUM CHLORIDE CRYS ER 20 MEQ PO TBCR: EXTENDED_RELEASE_TABLET | ORAL | Status: DC

## 2014-03-25 NOTE — Patient Instructions (Signed)
Your physician recommends that you schedule a follow-up appointment in: Keep appointment with Dr Stanford Breed 05/27/2014

## 2014-03-25 NOTE — Progress Notes (Signed)
03/25/2014 [name]   Sep 02, 1960  [id number]  Primary Physician [name], DO Primary Cardiologist: Dr. Stanford Breed  HPI:  The patient presents to clinic for two-week follow-up after recent office visit with Dr. Stanford Breed 03/08/2014. Her past medical history is significant for CAD with last left heart catheterization in February 2012 showing a 70% first diagonal, 50% first marginal and 50-60% left circumflex and 20% RCA. She also has an ischemic cardiomyopathy status post ICD placement. Last echocardiogram was in May 2015 demonstrating an ejection fraction of 15-20%. She was also noted to have grade 3 diastolic dysfunction. She is also status post aortic valve replacement secondary to aortic stenosis in 2013.   At her last office visit 2 weeks ago, she complained of increased dyspnea on exertion however denied orthopnea, PND, pedal edema, chest pain and syncope. Per office notes, Dr. Stanford Breed continued her on her ACE inhibitor and beta blocker. Due to increased dyspnea, he added spironolactone, 25 mg daily. He ordered for her to undergo a follow-up BMP in 1 week to reassess potassium and renal function and to return to clinic for follow-up.  She notes significant improvement with addition of spironolactone. No further DOE. No resting symptoms. No chest pain. Unfortunately, she did not get her BMP as directed. We will plan to do this today.    Current Outpatient Prescriptions  Medication Sig Dispense Refill  . aspirin EC 325 MG tablet Take 325 mg by mouth daily.    Marland Kitchen atorvastatin (LIPITOR) 80 MG tablet Take 1 tablet (80 mg total) by mouth daily. 90 tablet 3  . carvedilol (COREG) 12.5 MG tablet Take 1 tablet (12.5 mg total) by mouth 2 (two) times daily with a meal. 60 tablet 12  . furosemide (LASIX) 40 MG tablet TAKE TWO TABLETS BY MOUTH TWICE DAILY 120 tablet 1  . KLOR-CON M20 20 MEQ tablet TAKE ONE  BY MOUTH TWICE DAILY 60 tablet 6  . lisinopril (PRINIVIL,ZESTRIL) 5 MG tablet Take 1  tablet (5 mg total) by mouth daily. 90 tablet 3  . spironolactone (ALDACTONE) 25 MG tablet Take 1 tablet (25 mg total) by mouth daily. 90 tablet 3  . [DISCONTINUED] budesonide-formoterol (SYMBICORT) 160-4.5 MCG/ACT inhaler Inhale 2 puffs into the lungs 2 (two) times daily. 1 Inhaler 0  . [DISCONTINUED] tiotropium (SPIRIVA) 18 MCG inhalation capsule Place 1 capsule (18 mcg total) into inhaler and inhale daily. 30 capsule 0   Current Facility-Administered Medications  Medication Dose Route Frequency Provider Last Rate Last Dose  . leuprolide (LUPRON) injection 11.25 mg  11.25 mg Intramuscular Q90 days Mora Bellman, MD   11.25 mg at 12/27/13 1430    Allergies  Allergen Reactions  . Penicillins Hives    History   Social History  . Marital Status: Single    Spouse Name: N/A    Number of Children: N/A  . Years of Education: N/A   Occupational History  . Not on file.   Social History Main Topics  . Smoking status: Former Smoker -- 1.00 packs/day for 10 years    Types: Cigarettes    Quit date: 05/27/2010  . Smokeless tobacco: Never Used  . Alcohol Use: 8.4 oz/week    14 Shots of liquor per week     Comment: drinks 1-2 shots of tequila most days  . Drug Use: Yes    Special: Marijuana, Cocaine     Comment: occasional marijuana; no longer using cocaine  . Sexual Activity: Yes   Other Topics Concern  . Not  on file   Social History Narrative   Single no children   Tobacco Use - Hx of x-20 years   History Cocaine and Mariguana use     Review of Systems: General: negative for chills, fever, night sweats or weight changes.  Cardiovascular: negative for chest pain, dyspnea on exertion, edema, orthopnea, palpitations, paroxysmal nocturnal dyspnea or shortness of breath Dermatological: negative for rash Respiratory: negative for cough or wheezing Urologic: negative for hematuria Abdominal: negative for nausea, vomiting, diarrhea, bright red blood per rectum, melena, or  hematemesis Neurologic: negative for visual changes, syncope, or dizziness All other systems reviewed and are otherwise negative except as noted above.    Last menstrual period 08/04/2011.  General appearance: alert, cooperative and no distress Neck: no carotid bruit and no JVD Lungs: clear to auscultation bilaterally Heart: regular rate and rhythm and 2/6 SM  Extremities: no LEE Pulses: 2+ and symmetric Skin: warm and dry Neurologic: Grossly normal  EKG Not preformed  ASSESSMENT AND PLAN:   1. DOE in Setting of Systolic Dysfunction: symptoms resolved after initiation of spironolactone. Will check BMP today to assess renal function and electrolytes. Will continue spironolactone if stable. Continue ACE, BB and lasix.   PLAN  Continue currently plan of care. Will change regimen only if abnormalities in renal function and/or electrolytes. Plan to keep f/u with Dr. Stanford Breed on 05/27/13.   SIMMONS, [place] 03/25/2014 2:03 PM

## 2014-03-25 NOTE — Telephone Encounter (Signed)
Refilled after office visit closed

## 2014-03-26 LAB — BASIC METABOLIC PANEL WITH GFR
BUN: 25 mg/dL — AB (ref 6–23)
CHLORIDE: 103 meq/L (ref 96–112)
CO2: 28 mEq/L (ref 19–32)
Calcium: 9.1 mg/dL (ref 8.4–10.5)
Creat: 1.23 mg/dL — ABNORMAL HIGH (ref 0.50–1.10)
GFR, EST NON AFRICAN AMERICAN: 50 mL/min — AB
GFR, Est African American: 58 mL/min — ABNORMAL LOW
GLUCOSE: 88 mg/dL (ref 70–99)
Potassium: 4.1 mEq/L (ref 3.5–5.3)
Sodium: 143 mEq/L (ref 135–145)

## 2014-03-26 LAB — BRAIN NATRIURETIC PEPTIDE: Brain Natriuretic Peptide: 615.6 pg/mL — ABNORMAL HIGH (ref 0.0–100.0)

## 2014-03-27 ENCOUNTER — Other Ambulatory Visit: Payer: Self-pay | Admitting: *Deleted

## 2014-03-27 ENCOUNTER — Ambulatory Visit: Payer: Medicaid Other | Admitting: Family Medicine

## 2014-03-27 DIAGNOSIS — I519 Heart disease, unspecified: Secondary | ICD-10-CM

## 2014-03-27 MED ORDER — FUROSEMIDE 40 MG PO TABS: 60.0000 mg | ORAL_TABLET | Freq: Two times a day (BID) | ORAL | Status: DC

## 2014-03-28 ENCOUNTER — Encounter (HOSPITAL_COMMUNITY): Payer: Self-pay | Admitting: Cardiology

## 2014-04-03 ENCOUNTER — Ambulatory Visit (INDEPENDENT_AMBULATORY_CARE_PROVIDER_SITE_OTHER): Payer: Medicaid Other | Admitting: Family Medicine

## 2014-04-03 ENCOUNTER — Encounter: Payer: Self-pay | Admitting: Family Medicine

## 2014-04-03 VITALS — BP 122/72 | HR 79 | Temp 97.4°F | Ht 65.5 in | Wt 180.9 lb

## 2014-04-03 DIAGNOSIS — R0600 Dyspnea, unspecified: Secondary | ICD-10-CM | POA: Insufficient documentation

## 2014-04-03 DIAGNOSIS — I428 Other cardiomyopathies: Secondary | ICD-10-CM

## 2014-04-03 DIAGNOSIS — I429 Cardiomyopathy, unspecified: Secondary | ICD-10-CM

## 2014-04-03 LAB — BASIC METABOLIC PANEL
BUN: 21 mg/dL (ref 6–23)
CHLORIDE: 104 meq/L (ref 96–112)
CO2: 23 meq/L (ref 19–32)
Calcium: 8.5 mg/dL (ref 8.4–10.5)
Creat: 1.16 mg/dL — ABNORMAL HIGH (ref 0.50–1.10)
Glucose, Bld: 129 mg/dL — ABNORMAL HIGH (ref 70–99)
Potassium: 3.4 mEq/L — ABNORMAL LOW (ref 3.5–5.3)
SODIUM: 140 meq/L (ref 135–145)

## 2014-04-03 NOTE — Progress Notes (Signed)
Subjective:    Patient ID: [name], female    DOB: 08/15/1960, 53 y.o.   MRN: [id number]  HPI  Shortness of Breath: Patient presents to family medicine clinic for increased shortness of breath of 6 day duration. She has not called her cardiology office concerning her shortness of breath.  Her past medical history significant for CAD with left heart catheterization in February 2012 showing a 70% first diagonal, 50% first marginal and 50-60% left circumflex complex and 20% RCA. She also has an ischemic cardio myopathy status post ICD placement, per cardiology notes. Last echocardiogram was May 2015 with an ejection fraction of 15-20%, with grade 3 diastolic dysfunction. She has had an aortic valve replacement, secondary to aortic stenosis in 2013 as well as mitral valve repair.  She has been seen at her cardiology's office last week, after change in medication regimen. She seen Dr. Haydee Bell, who sent her to Dr. Stanford Bell which added Aldactone to her medication regimen. A  BMP was drawn at that time, and  patient's creatinine mildly rose from 1.1-1.23. Patient was called and asked to decrease her Lasix dosage from 2-  40 mg tabs twice a day to 1.5- 40 mg tab twice a day by her cardiology's office last Wednesday. Patient states on Friday she started becoming mildly short of breath. Since then her dyspnea has progressed, and she is having difficulty with full sentences and walking distances. She states when attempting to have a bowel movement she becomes sweaty. She is mildly nauseous. She has decreased energy. She denies diarrhea, constipation, chest pain, palpitations, dizziness, orthopnea, edema of extremities or syncope.  Past Medical History  Diagnosis Date  . Chronic systolic heart failure   . Pulmonary nodule, left     f/u chest CT 2/12: interval clearing of RUL air space nodule, borderline enlarged mediastinal lymph nodes stable, pul. arterial HTN  . Ovarian cyst     ovarian cystic  mass  . Cocaine abuse     resolved  . Alcohol abuse     resolved  . Pulmonary hypertension   . 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 Roxan Bell)  . Mitral stenosis   . Nonischemic cardiomyopathy     a. cath 05/2011 showed a 70% D1, a 50% OM1, a 50-60% left circumflex and a 20% right coronary artery. Ejection fraction was 20%. There was mild MR and severe AI. There was mild pulmonary hypertension; b.echocardiogram 2/13 EF 20-25%, restrictive filling pattern, mod AS with mod  to severe AI. 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 mod to severe MR, mod LAE, mild RVE, mild RAE, mod TR  . Cannabis abuse   . Fibroids     abd. CT 2/12: large fibroid uterus  . Personal history of noncompliance with medical treatment, presenting hazards to health   . COPD (chronic obstructive pulmonary disease) 12/27/2008        . Anxiety   . Ventricular tachycardia     LifeVest  . Hypertension   . High cholesterol   . ICD (implantable cardiac defibrillator) in place   . History of blood transfusion 1980's    "after tubal pregnancy"  . CHF (congestive heart failure)   . Shortness of breath     "at rest" (01/04/2012)   Allergies  Allergen Reactions  . Penicillins Hives   Review of Systems Per HPI    Objective:   Physical Exam BP 122/72 mmHg  Pulse  79  Temp(Src) 97.4 F (36.3 C) (Oral)  Ht 5' 5.5" (1.664 m)  Wt 180 lb 14.4 oz (82.056 kg)  BMI 29.63 kg/m2  LMP 08/04/2011 Gen: Pleasant, African American female, no acute distress, nontoxic in appearance, well-developed, well-nourished, overweight. Able to speak in full sentences, very mildly short of breath. HEENT: AT. [place].  Bilateral eyes without injections or icterus. MMM. CV: RRR, 2/6 systolic murmur Chest: CTAB, no wheeze or crackles Abd: Soft. Round ,NTND. BS present. No Masses palpated.  Ext: No erythema. No edema. +2/4     Assessment & Plan:  [name] is a 53 y.o.  African-American female with extensive cardiac history and shortness of breath concerning for CHF exacerbation. - Patient was only able to walk 4 minutes with pulse ox around the clinic and then had to stop due to shortness of breath. She stopped several times during her walking and had labored breath. Her pulse oximetry never dropped below 96%. - I have asked the patient to restart her Lasix at 40 mg twice a day. Encouraged to follow a low sodium diet. - BNP, BMP collected today. Last BNP was approximately 615- 9 days ago. We will call the patient with results once they become available. - Echocardiogram ordered today for increasing dyspnea. - Patient encouraged to call her cardiologist today, and setup an appointment to see seen immediately concerning her worsening shortness of breath. - Red flags discussed in great detail and patient was urged to go to the emergency room immediately if she experiencing any worsening dyspnea, chest pain, dizziness, diaphoresis or syncope. Patient voiced understanding.

## 2014-04-03 NOTE — Assessment & Plan Note (Signed)
-   Patient was only able to walk 4 minutes with pulse ox around the clinic and then had to stop due to shortness of breath. She stopped several times during her walking and had labored breath. Her pulse oximetry never dropped below 96%. - I have asked the patient to restart her Lasix at 40 mg twice a day. Encouraged to follow a low sodium diet. - BNP, BMP collected today. Last BNP was approximately 615- 9 days ago. We will call the patient with results once they become available. - Echocardiogram ordered today for increasing dyspnea. - Patient encouraged to call her cardiologist today, and setup an appointment to see seen immediately concerning her worsening shortness of breath. - Red flags discussed in great detail and patient was urged to go to the emergency room immediately if she is experiencing any worsening dyspnea, chest pain, dizziness, diaphoresis or syncope. Patient voiced understanding.

## 2014-04-03 NOTE — Patient Instructions (Signed)
Please increase your Lasix to 4 times a day as you were taking prior. We will order an echo, when she get this as soon as possible. Call Dr. Jacalyn Lefevre office today, and get into see him as soon as possible. I will call you with your lab work once it becomes available. If for any reason you have chest pain, palpitations increasingly short of breath, or syncope please be seen urgently in the emergency room.

## 2014-04-04 ENCOUNTER — Telehealth: Payer: Self-pay | Admitting: *Deleted

## 2014-04-04 ENCOUNTER — Telehealth: Payer: Self-pay | Admitting: Family Medicine

## 2014-04-04 ENCOUNTER — Telehealth: Payer: Self-pay | Admitting: Cardiology

## 2014-04-04 DIAGNOSIS — I428 Other cardiomyopathies: Secondary | ICD-10-CM

## 2014-04-04 LAB — PRO B NATRIURETIC PEPTIDE: Pro B Natriuretic peptide (BNP): 9932 pg/mL — ABNORMAL HIGH (ref <126)

## 2014-04-04 NOTE — Telephone Encounter (Signed)
Spoke with pt and informed her of her 2D Echo scheduled on 04/05/2014 at 9:00am at [facility].  Pt was told to go to [address] and also that she could use valet parking if needed.  Katharina Caper, April D

## 2014-04-04 NOTE — Telephone Encounter (Signed)
Please call pt, her BNP is elevated, suggesting she is having a mild CHF exacerbation. In addition her potassium is mildly low and she should take an extra two tabs today. If she is not feeling better with increase dose of lasix today she needs to be seen either in the ED or at her cardiologist office today. Echocardiogram will be scheduled today, but it is uncertain if they will have availability urgently. Thanks.

## 2014-04-04 NOTE — Telephone Encounter (Signed)
Pt called in stating that she ws told by her PCP that she needed to be seen by her cardiologist as soon as possible due to her having fluid around her heart. I scheduled her for 1/4 with Gaspar Bidding, should she be seen sooner? Please call  Thanks

## 2014-04-04 NOTE — Telephone Encounter (Signed)
Documented in note from PCP:  She has been seen at her cardiology's office last week, after change in medication regimen. She seen Dr. Haydee Monica, who sent her to Dr. Stanford Breed which added Aldactone to her medication regimen. A BMP was drawn at that time, and patient's creatinine mildly rose from 1.1-1.23. Patient was called and asked to decrease her Lasix dosage from 2- 40 mg tabs twice a day to 1.5- 40 mg tab twice a day by her cardiology's office last Wednesday. Patient states on Friday she started becoming mildly short of breath. Since then her dyspnea has progressed, and she is having difficulty with full sentences and walking distances. She states when attempting to have a bowel movement she becomes sweaty. She is mildly nauseous. She has decreased energy. She denies diarrhea, constipation, chest pain, palpitations, dizziness, orthopnea, edema of extremities or syncope.  Appointment was made Jan 4th - but should patient be seen sooner? Add on with Dr. Stanford Breed or flex schedule?

## 2014-04-04 NOTE — Telephone Encounter (Signed)
Spoke with patient this afternoon, she states she is feeling mildly better. She has slept the majority of the day due to fatigue. She has yet to call her cardiologist, as I have asked her to do yesterday. She states she will do that as soon as we get off the phone today. Reviewed her lab work with her, with her kidney function mildly improving back down to 1.16. In addition we discussed her proBNP being high, 9932 and her potassium mildly low at 3.4. She denies any chest pain, increased shortness of breath from yesterday, syncope, palpitations or edema. - Echo is scheduled for tomorrow afternoon. - Take 2 extra potassium pills today. - I discussed with her that she is in a CHF exacerbation, although it may be mild for her. She is to continue the increased dose of Lasix, and contact her cardiologist today. I have sent office notes and labs to him via Epic. - I discussed importance of close monitoring and red flags. It was stressed that if patient worsens, she needs to seek immediate attention in the emergency room. Patient voiced understanding.  [name] DO PGY3 [facility]

## 2014-04-05 ENCOUNTER — Ambulatory Visit (HOSPITAL_COMMUNITY)
Admission: RE | Admit: 2014-04-05 | Discharge: 2014-04-05 | Disposition: A | Discharge status: Home / Self Care | Payer: Medicaid Other | Source: Ambulatory Visit | Attending: Family Medicine | Admitting: Family Medicine

## 2014-04-05 ENCOUNTER — Telehealth: Payer: Self-pay | Admitting: Family Medicine

## 2014-04-05 DIAGNOSIS — R0600 Dyspnea, unspecified: Secondary | ICD-10-CM | POA: Diagnosis present

## 2014-04-05 DIAGNOSIS — I428 Other cardiomyopathies: Secondary | ICD-10-CM

## 2014-04-05 DIAGNOSIS — I369 Nonrheumatic tricuspid valve disorder, unspecified: Secondary | ICD-10-CM

## 2014-04-05 NOTE — Telephone Encounter (Signed)
Spoke with pt, Aware of dr Jacalyn Lefevre recommendations.  Lab orders placed Follow up scheduled

## 2014-04-05 NOTE — Telephone Encounter (Signed)
Increase lasix 80 bid, bmet one week, fu pa ov Kirk Ruths

## 2014-04-05 NOTE — Progress Notes (Signed)
Echocardiogram  2D Echocardiogram has been performed.  [name] M 04/05/2014, 11:07 AM

## 2014-04-05 NOTE — Telephone Encounter (Signed)
Please call pt, her echocardiogram showed her heart pump function (Ejection fraction) is about the same as prior which was rather low about 20%. There are some other subtle changes, such as the right side of her heart is a little more dilated than her prior studies and her pressure is higher on that side (peak pressure). I see she has a follow up with Dr. Jacalyn Lefevre (cardiologist) office coming up this week. Please encourage her to keep this appointment and they will be able to go over her result in more detail. Thank you.

## 2014-04-08 NOTE — Telephone Encounter (Signed)
LVM for patient to call back to inform her of below message

## 2014-04-22 ENCOUNTER — Ambulatory Visit (INDEPENDENT_AMBULATORY_CARE_PROVIDER_SITE_OTHER): Payer: Medicaid Other | Admitting: Physician Assistant

## 2014-04-22 ENCOUNTER — Encounter: Payer: Self-pay | Admitting: Physician Assistant

## 2014-04-22 VITALS — BP 106/70 | HR 62 | Ht 65.5 in | Wt 180.0 lb

## 2014-04-22 DIAGNOSIS — I428 Other cardiomyopathies: Secondary | ICD-10-CM

## 2014-04-22 DIAGNOSIS — I5043 Acute on chronic combined systolic (congestive) and diastolic (congestive) heart failure: Secondary | ICD-10-CM

## 2014-04-22 DIAGNOSIS — I429 Cardiomyopathy, unspecified: Secondary | ICD-10-CM

## 2014-04-22 DIAGNOSIS — I2510 Atherosclerotic heart disease of native coronary artery without angina pectoris: Secondary | ICD-10-CM

## 2014-04-22 DIAGNOSIS — I10 Essential (primary) hypertension: Secondary | ICD-10-CM

## 2014-04-22 NOTE — Progress Notes (Signed)
Patient ID: [name], female   DOB: 26-Nov-1960, 54 y.o.   MRN: [id number]    Date:  04/22/2014   ID:  [name], DOB [birth year], MRN [id number]  PCP:  [name], DO  Primary Cardiologist:  Stanford Breed    History of Present Illness: [name] is a 54 y.o. female with a past medical history significant for CAD with last left heart catheterization in February 2012 showing a 70% first diagonal, 50% first marginal and 50-60% left circumflex and 20% RCA. She also has an ischemic cardiomyopathy status post ICD placement. Last echocardiogram was in May 2015 demonstrating an ejection fraction of 15-20%. She was also noted to have grade 3 diastolic dysfunction. She is also status post aortic valve replacement secondary to aortic stenosis in 2013.   At her office visit in November, she complained of increased dyspnea on exertion however denied orthopnea, PND, pedal edema, chest pain and syncope. Per office notes, Dr. Stanford Breed continued her on her ACE inhibitor and beta blocker. Due to increased dyspnea, he added spironolactone, 25 mg daily.   Due to worsening serum creatinine patient's Lasix was decreased to 60 mg twice daily. She subsequently had worsening shortness of breath orthopnea. Lasix was reinitiated anemia 80 mg twice daily and the patient presents today for evaluation. She reports complete resolution of symptoms. Is feeling very well. She has no orthopnea no PND no lower extremity edema. She just had a basic metabolic panel done prior to come up to the office.   The patient currently denies nausea, vomiting, fever, chest pain, shortness of breath, orthopnea, dizziness, PND, cough, congestion, abdominal pain, hematochezia, melena, lower extremity edema, claudication.  Wt Readings from Last 3 Encounters:  04/22/14 180 lb (81.647 kg)  04/03/14 180 lb 14.4 oz (82.056 kg)  03/25/14 182 lb (82.555 kg)     Past Medical History  Diagnosis Date  . Chronic systolic heart failure   .  Pulmonary nodule, left     f/u chest CT 2/12: interval clearing of RUL air space nodule, borderline enlarged mediastinal lymph nodes stable, pul. arterial HTN  . Ovarian cyst     ovarian cystic mass  . Cocaine abuse     resolved  . Alcohol abuse     resolved  . Pulmonary hypertension   . 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 Roxan Hockey)  . Mitral stenosis   . Nonischemic cardiomyopathy     a. cath 05/2011 showed a 70% D1, a 50% OM1, a 50-60% left circumflex and a 20% right coronary artery. Ejection fraction was 20%. There was mild MR and severe AI. There was mild pulmonary hypertension; b.echocardiogram 2/13 EF 20-25%, restrictive filling pattern, mod AS with mod  to severe AI. 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 mod to severe MR, mod LAE, mild RVE, mild RAE, mod TR  . Cannabis abuse   . Fibroids     abd. CT 2/12: large fibroid uterus  . Personal history of noncompliance with medical treatment, presenting hazards to health   . COPD (chronic obstructive pulmonary disease) 12/27/2008        . Anxiety   . Ventricular tachycardia     LifeVest  . Hypertension   . High cholesterol   . ICD (implantable cardiac defibrillator) in place   . History of blood transfusion 1980's    "after tubal pregnancy"  . CHF (congestive heart failure)   . Shortness of breath     "  at rest" (01/04/2012)    Current Outpatient Prescriptions  Medication Sig Dispense Refill  . aspirin EC 325 MG tablet Take 325 mg by mouth daily.    Marland Kitchen atorvastatin (LIPITOR) 80 MG tablet Take 1 tablet (80 mg total) by mouth daily. 90 tablet 3  . carvedilol (COREG) 12.5 MG tablet Take 1 tablet (12.5 mg total) by mouth 2 (two) times daily with a meal. 60 tablet 12  . furosemide (LASIX) 40 MG tablet Take 1.5 tablets (60 mg total) by mouth 2 (two) times daily. 120 tablet 1  . lisinopril (PRINIVIL,ZESTRIL) 5 MG tablet Take 1 tablet (5 mg total) by mouth daily. 90  tablet 3  . potassium chloride SA (KLOR-CON M20) 20 MEQ tablet TAKE ONE  BY MOUTH TWICE DAILY 60 tablet 6  . spironolactone (ALDACTONE) 25 MG tablet Take 1 tablet (25 mg total) by mouth daily. 90 tablet 3  . [DISCONTINUED] budesonide-formoterol (SYMBICORT) 160-4.5 MCG/ACT inhaler Inhale 2 puffs into the lungs 2 (two) times daily. 1 Inhaler 0  . [DISCONTINUED] tiotropium (SPIRIVA) 18 MCG inhalation capsule Place 1 capsule (18 mcg total) into inhaler and inhale daily. 30 capsule 0   Current Facility-Administered Medications  Medication Dose Route Frequency Provider Last Rate Last Dose  . leuprolide (LUPRON) injection 11.25 mg  11.25 mg Intramuscular Q90 days Mora Bellman, MD   11.25 mg at 12/27/13 1430    Allergies:    Allergies  Allergen Reactions  . Penicillins Hives    Social History:  The patient  reports that she quit smoking about 3 years ago. Her smoking use included Cigarettes. She has a 10 pack-year smoking history. She has never used smokeless tobacco. She reports that she drinks about 8.4 oz of alcohol per week. She reports that she uses illicit drugs (Marijuana and Cocaine).   Family history:   Family History  Problem Relation Age of Onset  . Coronary artery disease    . Hypertension Mother   . Heart disease Mother   . COPD Mother   . Cholecystitis Mother   . Heart attack Mother   . Anesthesia problems Neg Hx   . Cancer Brother   . Heart disease Maternal Grandmother   . Heart attack Maternal Grandmother     ROS:  Please see the history of present illness.  All other systems reviewed and negative.   PHYSICAL EXAM: VS:  BP 106/70 mmHg  Pulse 62  Ht 5' 5.5" (1.664 m)  Wt 180 lb (81.647 kg)  BMI 29.49 kg/m2  LMP 08/04/2011 Well nourished, well developed, in no acute distress HEENT: Pupils are equal round react to light accommodation extraocular movements are intact.  Neck: no JVDNo cervical lymphadenopathy. Cardiac: Regular rate and rhythm with 2/6 sys murmur at  LSB. No rubs or gallops. Lungs:  clear to auscultation bilaterally, no wheezing, rhonchi or rales Abd: soft, nontender, positive bowel sounds all quadrants, no hepatosplenomegaly Ext: no lower extremity edema.  2+ radial and dorsalis pedis pulses. Skin: warm and dry Neuro:  Grossly normal     ASSESSMENT AND PLAN:  Problem List Items Addressed This Visit    Nonischemic cardiomyopathy    See above    Essential hypertension    Blood pressure controlled.    Coronary atherosclerosis of native coronary artery    No complaints of angina    Acute on chronic combined systolic and diastolic heart failure - Primary    Patient is an resolution of symptoms since resuming Lasix 80 mg twice daily. Currently feels  fine. We've discussed daily weight monitoring low-sodium diet.  He just had a basic metabolic panel completed prior to coming up to the office. I'll review those results.

## 2014-04-22 NOTE — Assessment & Plan Note (Signed)
Blood pressure controlled. 

## 2014-04-22 NOTE — Assessment & Plan Note (Signed)
Patient is an resolution of symptoms since resuming Lasix 80 mg twice daily. Currently feels fine. We've discussed daily weight monitoring low-sodium diet.  He just had a basic metabolic panel completed prior to coming up to the office. I'll review those results.

## 2014-04-22 NOTE — Assessment & Plan Note (Signed)
See above

## 2014-04-22 NOTE — Patient Instructions (Signed)
Your physician wants you to follow-up in 3 months with Dr. Stanford Breed. You will receive a reminder letter in the mail 2 months in advance. If you do not receive a letter, please call our office to schedule the follow-up appointment.

## 2014-04-22 NOTE — Assessment & Plan Note (Signed)
No complaints of angina. 

## 2014-04-23 LAB — BASIC METABOLIC PANEL WITH GFR
BUN: 22 mg/dL (ref 6–23)
CO2: 30 meq/L (ref 19–32)
Calcium: 9.6 mg/dL (ref 8.4–10.5)
Chloride: 102 mEq/L (ref 96–112)
Creat: 1.2 mg/dL — ABNORMAL HIGH (ref 0.50–1.10)
GFR, Est African American: 60 mL/min
GFR, Est Non African American: 52 mL/min — ABNORMAL LOW
GLUCOSE: 95 mg/dL (ref 70–99)
Potassium: 4.3 mEq/L (ref 3.5–5.3)
Sodium: 140 mEq/L (ref 135–145)

## 2014-05-04 ENCOUNTER — Other Ambulatory Visit: Payer: Self-pay | Admitting: Internal Medicine

## 2014-05-06 NOTE — Telephone Encounter (Signed)
Pt need a new prescription for her Lasix. Please call this in today to [phone].

## 2014-05-27 ENCOUNTER — Ambulatory Visit: Payer: Medicaid Other | Admitting: Cardiology

## 2014-06-06 ENCOUNTER — Telehealth: Payer: Self-pay | Admitting: Cardiology

## 2014-06-06 MED ORDER — FUROSEMIDE 40 MG PO TABS: 80.0000 mg | ORAL_TABLET | Freq: Two times a day (BID) | ORAL | Status: DC

## 2014-06-06 NOTE — Telephone Encounter (Signed)
Refill submitted to patient's preferred pharmacy. Informed patient. Pt voiced understanding, no other stated concerns at this time.  

## 2014-06-06 NOTE — Telephone Encounter (Signed)
°  1. Which medications need to be refilled? Furosemide  2. Which pharmacy is medication to be sent to?[facility] on [address]  3. Do they need a 30 day or 90 day supply? 30  4. Would they like a call back once the medication has been sent to the pharmacy? yes

## 2014-06-13 ENCOUNTER — Encounter: Payer: Self-pay | Admitting: *Deleted

## 2014-06-24 ENCOUNTER — Ambulatory Visit (INDEPENDENT_AMBULATORY_CARE_PROVIDER_SITE_OTHER): Payer: Medicaid Other | Admitting: *Deleted

## 2014-06-24 DIAGNOSIS — I5043 Acute on chronic combined systolic (congestive) and diastolic (congestive) heart failure: Secondary | ICD-10-CM

## 2014-06-24 DIAGNOSIS — I429 Cardiomyopathy, unspecified: Secondary | ICD-10-CM

## 2014-06-24 DIAGNOSIS — I519 Heart disease, unspecified: Secondary | ICD-10-CM

## 2014-06-24 DIAGNOSIS — I428 Other cardiomyopathies: Secondary | ICD-10-CM

## 2014-06-24 LAB — MDC_IDC_ENUM_SESS_TYPE_INCLINIC
Battery Voltage: 3.16 V
Brady Statistic RV Percent Paced: 1.7 %
HighPow Impedance: 80 Ohm
Lead Channel Impedance Value: 437 Ohm
Lead Channel Pacing Threshold Amplitude: 0.75 V
Lead Channel Pacing Threshold Pulse Width: 0.4 ms
Lead Channel Sensing Intrinsic Amplitude: 20 mV
Lead Channel Setting Pacing Amplitude: 2.5 V
MDC IDC SET LEADCHNL RV PACING PULSEWIDTH: 0.4 ms
MDC IDC SET LEADCHNL RV SENSING SENSITIVITY: 0.3 mV
MDC IDC SET ZONE DETECTION INTERVAL: 280 ms
MDC IDC SET ZONE DETECTION INTERVAL: 350 ms
MDC IDC SET ZONE DETECTION INTERVAL: 360 ms

## 2014-06-24 NOTE — Progress Notes (Signed)
ICD check in clinic. Normal device function. Threshold and sensing consistent with previous device measurements. Impedance trends stable over time. SIC=5. No evidence of any ventricular arrhythmias. Histogram distribution appropriate for patient and level of activity. No changes made this session. Device programmed at appropriate safety margins. Device programmed to optimize intrinsic conduction. Batt voltage 3.16V (ERI 2.63V). Stable thoracic impedance. Plan to follow up with the device clinic in 3 months and with JA in 02-2015. Patient education completed including shock plan. Alert tones demonstrated for patient.

## 2014-07-05 NOTE — Progress Notes (Signed)
HPI: FU AVR secondary to aortic stenosis and CAD. Repeat echocardiogram 2/13 showed an ejection fraction of 20-25%, restrictive filling pattern, moderate aortic stenosis with moderate to severe aortic insufficiency. 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 There was moderate to severe mitral regurgitation. There was moderate left atrial enlargement, mild right ventricular and right atrial enlargement and moderate tricuspid regurgitation. Cardiac catheterization in February of 2012 showed a 70% first diagonal, a 50% first marginal, a 50-60% left circumflex and a 20% right coronary artery. Ejection fraction was 20%. There was mild mitral regurgitation and severe aortic insufficiency. There was mild pulmonary hypertension. Patient had AVR 59mm Edwards Magna Ease Pericardial Valve Conduit Aortic Root Enlargement utilizing a Hemashield Graft; MV Annuloplasty utilizing 34mm Edwards Physio II Ring in April of 2013. Had ICD placed. Echo 12/15 showed EF 20-25 bioprosthetic aortic valve, previous MV repair, moderate MR, biatrial enlargement, mild RVE, severe TR, moderate PI, moderate to severe pulmonary hypertension. Since she was last seen, she is doing very well. She denies dyspnea, chest pain, palpitations, syncope or pedal edema.  Current Outpatient Prescriptions  Medication Sig Dispense Refill  . aspirin EC 325 MG tablet Take 325 mg by mouth daily.    Marland Kitchen atorvastatin (LIPITOR) 80 MG tablet Take 1 tablet (80 mg total) by mouth daily. 90 tablet 3  . carvedilol (COREG) 12.5 MG tablet Take 1 tablet (12.5 mg total) by mouth 2 (two) times daily with a meal. 60 tablet 12  . furosemide (LASIX) 40 MG tablet Take 2 tablets (80 mg total) by mouth 2 (two) times daily. 120 tablet 5  . lisinopril (PRINIVIL,ZESTRIL) 5 MG tablet Take 1 tablet (5 mg total) by mouth daily. 90 tablet 3  . potassium chloride SA (KLOR-CON M20) 20 MEQ tablet TAKE ONE  BY MOUTH TWICE DAILY 60 tablet 6  . spironolactone (ALDACTONE) 25 MG  tablet Take 1 tablet (25 mg total) by mouth daily. 90 tablet 3  . [DISCONTINUED] budesonide-formoterol (SYMBICORT) 160-4.5 MCG/ACT inhaler Inhale 2 puffs into the lungs 2 (two) times daily. 1 Inhaler 0  . [DISCONTINUED] tiotropium (SPIRIVA) 18 MCG inhalation capsule Place 1 capsule (18 mcg total) into inhaler and inhale daily. 30 capsule 0   No current facility-administered medications for this visit.     Past Medical History  Diagnosis Date  . Chronic systolic heart failure   . Pulmonary nodule, left     f/u chest CT 2/12: interval clearing of RUL air space nodule, borderline enlarged mediastinal lymph nodes stable, pul. arterial HTN  . Ovarian cyst     ovarian cystic mass  . Cocaine abuse     resolved  . Alcohol abuse     resolved  . Pulmonary hypertension   . 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 Roxan Hockey)  . Mitral stenosis   . Nonischemic cardiomyopathy     a. cath 05/2011 showed a 70% D1, a 50% OM1, a 50-60% left circumflex and a 20% right coronary artery. Ejection fraction was 20%. There was mild MR and severe AI. There was mild pulmonary hypertension; b.echocardiogram 2/13 EF 20-25%, restrictive filling pattern, mod AS with mod  to severe AI. 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 mod to severe MR, mod LAE, mild RVE, mild RAE, mod TR  . Cannabis abuse   . Fibroids     abd. CT 2/12: large fibroid uterus  . Personal history of noncompliance with medical treatment, presenting  hazards to health   . COPD (chronic obstructive pulmonary disease) 12/27/2008        . Anxiety   . Ventricular tachycardia     LifeVest  . Hypertension   . High cholesterol   . ICD (implantable cardiac defibrillator) in place   . History of blood transfusion 1980's    "after tubal pregnancy"  . CHF (congestive heart failure)   . Shortness of breath     "at rest" (01/04/2012)    Past Surgical History  Procedure Laterality Date    . Tee without cardioversion  06/08/2011    Procedure: TRANSESOPHAGEAL ECHOCARDIOGRAM (TEE);  Surgeon: Lelon Perla, MD;  Location: [place];  Service: Cardiovascular;  Laterality: N/A;  . Aortic valve replacement  08/04/2011    Procedure: AORTIC VALVE REPLACEMENT (AVR);  Surgeon: Melrose Nakayama, MD;  Location: [place];  Service: Open Heart Surgery;  Laterality: N/A;  WITH ROOT ENLARGEMENT  . Mitral valve repair  08/04/2011    Procedure: MITRAL VALVE REPAIR (MVR);  Surgeon: Melrose Nakayama, MD;  Location: [place];  Service: Open Heart Surgery;  Laterality: N/A;  . Cardiac defibrillator placement  01/04/2012    MDT Protecta XT VR ICD implanted by Dr Rayann Heman  . Cardiac valve replacement    . Tonsillectomy  ~ 1972  . Ectopic pregnancy surgery  1980's  . Cardiac catheterization  2008; ~ 2011;  05/2011  . Left and right heart catheterization with coronary angiogram N/A 06/07/2011    Procedure: LEFT AND RIGHT HEART CATHETERIZATION WITH CORONARY ANGIOGRAM;  Surgeon: Peter M Martinique, MD;  Location: [facility] CATH LAB;  Service: Cardiovascular;  Laterality: N/A;  . Implantable cardioverter defibrillator implant N/A 01/04/2012    Procedure: IMPLANTABLE CARDIOVERTER DEFIBRILLATOR IMPLANT;  Surgeon: Thompson Grayer, MD;  Location: [facility] CATH LAB;  Service: Cardiovascular;  Laterality: N/A;    History   Social History  . Marital Status: Single    Spouse Name: N/A  . Number of Children: N/A  . Years of Education: N/A   Occupational History  . Not on file.   Social History Main Topics  . Smoking status: Former Smoker -- 1.00 packs/day for 10 years    Types: Cigarettes    Quit date: 05/27/2010  . Smokeless tobacco: Never Used  . Alcohol Use: 8.4 oz/week    14 Shots of liquor per week     Comment: drinks 1-2 shots of tequila most days  . Drug Use: Yes    Special: Marijuana, Cocaine     Comment: occasional marijuana; no longer using cocaine  . Sexual Activity: Yes   Other Topics Concern  . Not on  file   Social History Narrative   Single no children   Tobacco Use - Hx of x-20 years   History Cocaine and [place]iguana use    ROS: no fevers or chills, productive cough, hemoptysis, dysphasia, odynophagia, melena, hematochezia, dysuria, hematuria, rash, seizure activity, orthopnea, PND, pedal edema, claudication. Remaining systems are negative.  Physical Exam: [name] well-nourished in no acute distress.  Skin is warm and dry.  HEENT is normal.  Neck is supple.  Chest is clear to auscultation with normal expansion.  Cardiovascular exam is regular rate and rhythm. 2/6 systolic murmur left sternal border. Abdominal exam nontender or distended. No masses palpated. Extremities show no edema. neuro grossly intact

## 2014-07-08 ENCOUNTER — Encounter: Payer: Self-pay | Admitting: Cardiology

## 2014-07-08 ENCOUNTER — Ambulatory Visit (INDEPENDENT_AMBULATORY_CARE_PROVIDER_SITE_OTHER): Payer: Medicaid Other | Admitting: Cardiology

## 2014-07-08 VITALS — BP 90/60 | HR 64 | Ht 65.5 in | Wt 183.0 lb

## 2014-07-08 DIAGNOSIS — I519 Heart disease, unspecified: Secondary | ICD-10-CM

## 2014-07-08 DIAGNOSIS — Z954 Presence of other heart-valve replacement: Secondary | ICD-10-CM

## 2014-07-08 DIAGNOSIS — I428 Other cardiomyopathies: Secondary | ICD-10-CM

## 2014-07-08 DIAGNOSIS — E785 Hyperlipidemia, unspecified: Secondary | ICD-10-CM

## 2014-07-08 DIAGNOSIS — I10 Essential (primary) hypertension: Secondary | ICD-10-CM

## 2014-07-08 DIAGNOSIS — I429 Cardiomyopathy, unspecified: Secondary | ICD-10-CM

## 2014-07-08 DIAGNOSIS — Z952 Presence of prosthetic heart valve: Secondary | ICD-10-CM

## 2014-07-08 DIAGNOSIS — I2510 Atherosclerotic heart disease of native coronary artery without angina pectoris: Secondary | ICD-10-CM

## 2014-07-08 LAB — BASIC METABOLIC PANEL WITH GFR
BUN: 27 mg/dL — AB (ref 6–23)
CHLORIDE: 103 meq/L (ref 96–112)
CO2: 28 mEq/L (ref 19–32)
CREATININE: 1.46 mg/dL — AB (ref 0.50–1.10)
Calcium: 9.4 mg/dL (ref 8.4–10.5)
GFR, EST AFRICAN AMERICAN: 47 mL/min — AB
GFR, EST NON AFRICAN AMERICAN: 41 mL/min — AB
Glucose, Bld: 110 mg/dL — ABNORMAL HIGH (ref 70–99)
POTASSIUM: 4.4 meq/L (ref 3.5–5.3)
Sodium: 140 mEq/L (ref 135–145)

## 2014-07-08 LAB — LIPID PANEL
CHOL/HDL RATIO: 3 ratio
CHOLESTEROL: 172 mg/dL (ref 0–200)
HDL: 57 mg/dL (ref >=46)
LDL Cholesterol: 95 mg/dL (ref 0–99)
TRIGLYCERIDES: 99 mg/dL (ref <150)
VLDL: 20 mg/dL (ref 0–40)

## 2014-07-08 LAB — HEPATIC FUNCTION PANEL
ALT: 15 U/L (ref 0–35)
AST: 20 U/L (ref 0–37)
Albumin: 4.5 g/dL (ref 3.5–5.2)
Alkaline Phosphatase: 91 U/L (ref 39–117)
BILIRUBIN TOTAL: 0.6 mg/dL (ref 0.2–1.2)
Bilirubin, Direct: 0.1 mg/dL (ref 0.0–0.3)
Indirect Bilirubin: 0.5 mg/dL (ref 0.2–1.2)
Total Protein: 7.1 g/dL (ref 6.0–8.3)

## 2014-07-08 NOTE — Assessment & Plan Note (Signed)
euvolemic on examination. Continue present dose of diuretics. Check potassium and renal function. I have asked her to weigh herself daily and she will take an additional 40 mg of Lasix for weight gain of 2-3 pounds. We also discussed low-sodium diet. Continue ACE inhibitor and beta blocker.

## 2014-07-08 NOTE — Assessment & Plan Note (Signed)
Continue statin. Check lipids and liver. 

## 2014-07-08 NOTE — Assessment & Plan Note (Signed)
Followed by electrophysiology. 

## 2014-07-08 NOTE — Patient Instructions (Signed)
Your physician wants you to follow-up in: 6 MONTHS WITH DR CRENSHAW You will receive a reminder letter in the mail two months in advance. If you don't receive a letter, please call our office to schedule the follow-up appointment.   Your physician recommends that you HAVE LAB WORK TODAY 

## 2014-07-08 NOTE — Assessment & Plan Note (Signed)
Continue present blood pressure medications. 

## 2014-07-08 NOTE — Assessment & Plan Note (Signed)
Discussed SBE prophylaxis for aortic valve replacement and mitral valve repair.

## 2014-07-08 NOTE — Assessment & Plan Note (Signed)
Continue ASA and statin  

## 2014-07-08 NOTE — Assessment & Plan Note (Signed)
Continue ACE inhibitor and beta blocker. 

## 2014-07-09 ENCOUNTER — Encounter: Payer: Self-pay | Admitting: Internal Medicine

## 2014-07-10 ENCOUNTER — Encounter: Payer: Self-pay | Admitting: *Deleted

## 2014-07-15 ENCOUNTER — Encounter: Payer: Self-pay | Admitting: Cardiology

## 2014-07-15 NOTE — Telephone Encounter (Signed)
This encounter was created in error - please disregard.

## 2014-07-15 NOTE — Telephone Encounter (Signed)
Pt called in stating that she had some blood work done last week and she has not heard about her results yet. Please f/u with pt  Thanks

## 2014-08-01 ENCOUNTER — Ambulatory Visit: Payer: Medicaid Other | Admitting: Family Medicine

## 2014-08-07 ENCOUNTER — Other Ambulatory Visit: Payer: Self-pay

## 2014-08-07 DIAGNOSIS — E7800 Pure hypercholesterolemia, unspecified: Secondary | ICD-10-CM

## 2014-08-07 MED ORDER — ATORVASTATIN CALCIUM 80 MG PO TABS: 80.0000 mg | ORAL_TABLET | Freq: Every day | ORAL | Status: DC

## 2014-08-23 ENCOUNTER — Other Ambulatory Visit: Payer: Self-pay | Admitting: Cardiology

## 2014-08-23 MED ORDER — CARVEDILOL 12.5 MG PO TABS: 12.5000 mg | ORAL_TABLET | Freq: Two times a day (BID) | ORAL | Status: DC

## 2014-08-23 NOTE — Telephone Encounter (Signed)
°  1. Which medications need to be refilled? Carvedilol  2. Which pharmacy is medication to be sent to?[facility] on [address] [phone]  3. Do they need a 30 day or 90 day supply? 30  4. Would they like a call back once the medication has been sent to the pharmacy? Yes, because she is currently out

## 2014-08-23 NOTE — Telephone Encounter (Signed)
Rx(s) sent to pharmacy electronically. Patient notified. 

## 2014-10-07 ENCOUNTER — Ambulatory Visit (INDEPENDENT_AMBULATORY_CARE_PROVIDER_SITE_OTHER): Payer: Medicaid Other | Admitting: *Deleted

## 2014-10-07 DIAGNOSIS — I519 Heart disease, unspecified: Secondary | ICD-10-CM

## 2014-10-07 DIAGNOSIS — Z95810 Presence of automatic (implantable) cardiac defibrillator: Secondary | ICD-10-CM | POA: Diagnosis not present

## 2014-10-07 DIAGNOSIS — I428 Other cardiomyopathies: Secondary | ICD-10-CM

## 2014-10-07 DIAGNOSIS — I429 Cardiomyopathy, unspecified: Secondary | ICD-10-CM | POA: Diagnosis not present

## 2014-10-07 LAB — CUP PACEART INCLINIC DEVICE CHECK
Battery Voltage: 3.14 V
HIGH POWER IMPEDANCE MEASURED VALUE: 84 Ohm
HighPow Impedance: 19 Ohm
HighPow Impedance: 437 Ohm
Lead Channel Sensing Intrinsic Amplitude: 27.25 mV
Lead Channel Setting Pacing Amplitude: 2.5 V
Lead Channel Setting Sensing Sensitivity: 0.3 mV
MDC IDC MSMT LEADCHNL RV IMPEDANCE VALUE: 456 Ohm
MDC IDC MSMT LEADCHNL RV PACING THRESHOLD AMPLITUDE: 0.75 V
MDC IDC MSMT LEADCHNL RV PACING THRESHOLD PULSEWIDTH: 0.4 ms
MDC IDC MSMT LEADCHNL RV SENSING INTR AMPL: 19.625 mV
MDC IDC SESS DTM: 20160620112641
MDC IDC SET LEADCHNL RV PACING PULSEWIDTH: 0.4 ms
MDC IDC SET ZONE DETECTION INTERVAL: 280 ms
MDC IDC STAT BRADY RV PERCENT PACED: 1 %
Zone Setting Detection Interval: 350 ms
Zone Setting Detection Interval: 360 ms

## 2014-10-07 NOTE — Progress Notes (Signed)
ICD check in clinic. Normal device function. Threshold and sensing consistent with previous device measurements. Impedance trends stable over time. 1 NSVT--- 28 beats. Histogram distribution appropriate for patient and level of activity. No changes made this session. Device programmed at appropriate safety margins. Device programmed to optimize intrinsic conduction. Remaining power 3.14V. ROV w/ device clinic 01/13/15 & w/ JA in 31mo.

## 2014-10-16 ENCOUNTER — Encounter: Payer: Self-pay | Admitting: Internal Medicine

## 2014-12-16 ENCOUNTER — Other Ambulatory Visit: Payer: Self-pay | Admitting: Cardiology

## 2014-12-16 NOTE — Telephone Encounter (Signed)
REFILL 

## 2014-12-19 ENCOUNTER — Other Ambulatory Visit: Payer: Self-pay | Admitting: Cardiology

## 2015-01-07 NOTE — Progress Notes (Signed)
HPI: FU AVR secondary to aortic stenosis and CAD. Repeat echocardiogram 2/13 showed an ejection fraction of 20-25%, restrictive filling pattern, moderate aortic stenosis with moderate to severe aortic insufficiency. 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 There was moderate to severe mitral regurgitation. There was moderate left atrial enlargement, mild right ventricular and right atrial enlargement and moderate tricuspid regurgitation. Cardiac catheterization in February of 2012 showed a 70% first diagonal, a 50% first marginal, a 50-60% left circumflex and a 20% right coronary artery. Ejection fraction was 20%. There was mild mitral regurgitation and severe aortic insufficiency. There was mild pulmonary hypertension. Patient had AVR 41mm Edwards Magna Ease Pericardial Valve Conduit Aortic Root Enlargement utilizing a Hemashield Graft; MV Annuloplasty utilizing 48mm Edwards Physio II Ring in April of 2013. Had ICD placed. Echo 12/15 showed EF 20-25 bioprosthetic aortic valve, previous MV repair, moderate MR, biatrial enlargement, mild RVE, severe TR, moderate PI, moderate to severe pulmonary hypertension. Since she was last seen, the patient denies any dyspnea on exertion, orthopnea, PND, pedal edema, palpitations, syncope or chest pain.   Current Outpatient Prescriptions  Medication Sig Dispense Refill  . aspirin EC 325 MG tablet Take 325 mg by mouth daily.    Marland Kitchen atorvastatin (LIPITOR) 80 MG tablet Take 1 tablet (80 mg total) by mouth daily. 90 tablet 3  . carvedilol (COREG) 12.5 MG tablet Take 1 tablet (12.5 mg total) by mouth 2 (two) times daily with a meal. 60 tablet 9  . furosemide (LASIX) 40 MG tablet TAKE TWO TABLETS BY MOUTH TWICE DAILY. 120 tablet 1  . KLOR-CON M20 20 MEQ tablet TAKE ONE TABLET BY MOUTH TWICE DAILY 60 tablet 0  . lisinopril (PRINIVIL,ZESTRIL) 5 MG tablet Take 1 tablet (5 mg total) by mouth daily. 90 tablet 3  . spironolactone (ALDACTONE) 25 MG tablet Take 1 tablet (25  mg total) by mouth daily. 90 tablet 3  . [DISCONTINUED] budesonide-formoterol (SYMBICORT) 160-4.5 MCG/ACT inhaler Inhale 2 puffs into the lungs 2 (two) times daily. 1 Inhaler 0  . [DISCONTINUED] tiotropium (SPIRIVA) 18 MCG inhalation capsule Place 1 capsule (18 mcg total) into inhaler and inhale daily. 30 capsule 0   No current facility-administered medications for this visit.     Past Medical History  Diagnosis Date  . Chronic systolic heart failure   . Pulmonary nodule, left     f/u chest CT 2/12: interval clearing of RUL air space nodule, borderline enlarged mediastinal lymph nodes stable, pul. arterial HTN  . Ovarian cyst     ovarian cystic mass  . Cocaine abuse     resolved  . Alcohol abuse     resolved  . Pulmonary hypertension   . 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 Roxan Hockey)  . Mitral stenosis   . Nonischemic cardiomyopathy     a. cath 05/2011 showed a 70% D1, a 50% OM1, a 50-60% left circumflex and a 20% right coronary artery. Ejection fraction was 20%. There was mild MR and severe AI. There was mild pulmonary hypertension; b.echocardiogram 2/13 EF 20-25%, restrictive filling pattern, mod AS with mod  to severe AI. 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 mod to severe MR, mod LAE, mild RVE, mild RAE, mod TR  . Cannabis abuse   . Fibroids     abd. CT 2/12: large fibroid uterus  . Personal history of noncompliance with medical treatment, presenting hazards to health   .  COPD (chronic obstructive pulmonary disease) 12/27/2008        . Anxiety   . Ventricular tachycardia     LifeVest  . Hypertension   . High cholesterol   . ICD (implantable cardiac defibrillator) in place   . History of blood transfusion 1980's    "after tubal pregnancy"  . CHF (congestive heart failure)   . Shortness of breath     "at rest" (01/04/2012)    Past Surgical History  Procedure Laterality Date  . Tee without  cardioversion  06/08/2011    Procedure: TRANSESOPHAGEAL ECHOCARDIOGRAM (TEE);  Surgeon: Lelon Perla, MD;  Location: [place];  Service: Cardiovascular;  Laterality: N/A;  . Aortic valve replacement  08/04/2011    Procedure: AORTIC VALVE REPLACEMENT (AVR);  Surgeon: Melrose Nakayama, MD;  Location: [place];  Service: Open Heart Surgery;  Laterality: N/A;  WITH ROOT ENLARGEMENT  . Mitral valve repair  08/04/2011    Procedure: MITRAL VALVE REPAIR (MVR);  Surgeon: Melrose Nakayama, MD;  Location: [place];  Service: Open Heart Surgery;  Laterality: N/A;  . Cardiac defibrillator placement  01/04/2012    MDT Protecta XT VR ICD implanted by Dr Rayann Heman  . Cardiac valve replacement    . Tonsillectomy  ~ 1972  . Ectopic pregnancy surgery  1980's  . Cardiac catheterization  2008; ~ 2011;  05/2011  . Left and right heart catheterization with coronary angiogram N/A 06/07/2011    Procedure: LEFT AND RIGHT HEART CATHETERIZATION WITH CORONARY ANGIOGRAM;  Surgeon: Peter M Martinique, MD;  Location: [facility] CATH LAB;  Service: Cardiovascular;  Laterality: N/A;  . Implantable cardioverter defibrillator implant N/A 01/04/2012    Procedure: IMPLANTABLE CARDIOVERTER DEFIBRILLATOR IMPLANT;  Surgeon: Thompson Grayer, MD;  Location: [facility] C[facility] LAB;  Service: Cardiovascular;  Laterality: N/A;    Social History   Social History  . Marital Status: Single    Spouse Name: N/A  . Number of Children: N/A  . Years of Education: N/A   Occupational History  . Not on file.   Social History Main Topics  . Smoking status: Former Smoker -- 1.00 packs/day for 10 years    Types: Cigarettes    Quit date: 05/27/2010  . Smokeless tobacco: Never Used  . Alcohol Use: 8.4 oz/week    14 Shots of liquor per week     Comment: drinks 1-2 shots of tequila most days  . Drug Use: Yes    Special: Marijuana, Cocaine     Comment: occasional marijuana; no longer using cocaine  . Sexual Activity: Yes   Other Topics Concern  . Not on file    Social History Narrative   Single no children   Tobacco Use - Hx of x-20 years   History Cocaine and [place] use    ROS: no fevers or chills, productive cough, hemoptysis, dysphasia, odynophagia, melena, hematochezia, dysuria, hematuria, rash, seizure activity, orthopnea, PND, pedal edema, claudication. Remaining systems are negative.  Physical Exam: [name] well-nourished in no acute distress.  Skin is warm and dry.  HEENT is normal.  Neck is supple.  Chest is clear to auscultation with normal expansion. Previous sternotomy and ICD in place. Cardiovascular exam is regular rate and rhythm. 2/6 systolic murmur left sternal border. Abdominal exam nontender or distended. No masses palpated. Extremities show no edema. neuro grossly intact  ECG sinus rhythm at a rate of 68. Inferior lateral T-wave inversion. Right axis deviation.

## 2015-01-09 ENCOUNTER — Ambulatory Visit (INDEPENDENT_AMBULATORY_CARE_PROVIDER_SITE_OTHER): Payer: Medicaid Other | Admitting: Cardiology

## 2015-01-09 ENCOUNTER — Encounter: Payer: Self-pay | Admitting: Cardiology

## 2015-01-09 VITALS — BP 100/70 | HR 68 | Ht 65.0 in | Wt 186.0 lb

## 2015-01-09 DIAGNOSIS — I10 Essential (primary) hypertension: Secondary | ICD-10-CM | POA: Diagnosis not present

## 2015-01-09 DIAGNOSIS — I428 Other cardiomyopathies: Secondary | ICD-10-CM

## 2015-01-09 DIAGNOSIS — I2510 Atherosclerotic heart disease of native coronary artery without angina pectoris: Secondary | ICD-10-CM | POA: Diagnosis not present

## 2015-01-09 DIAGNOSIS — E785 Hyperlipidemia, unspecified: Secondary | ICD-10-CM

## 2015-01-09 DIAGNOSIS — Z95810 Presence of automatic (implantable) cardiac defibrillator: Secondary | ICD-10-CM

## 2015-01-09 DIAGNOSIS — I519 Heart disease, unspecified: Secondary | ICD-10-CM

## 2015-01-09 DIAGNOSIS — I429 Cardiomyopathy, unspecified: Secondary | ICD-10-CM

## 2015-01-09 LAB — LIPID PANEL
CHOL/HDL RATIO: 2.9 ratio (ref <=5.0)
Cholesterol: 162 mg/dL (ref 125–200)
HDL: 55 mg/dL (ref >=46)
LDL Cholesterol: 91 mg/dL (ref <130)
Triglycerides: 78 mg/dL (ref <150)
VLDL: 16 mg/dL (ref <30)

## 2015-01-09 LAB — BASIC METABOLIC PANEL
BUN: 33 mg/dL — ABNORMAL HIGH (ref 7–25)
CO2: 25 mmol/L (ref 20–31)
Calcium: 9.4 mg/dL (ref 8.6–10.4)
Chloride: 105 mmol/L (ref 98–110)
Creat: 1.26 mg/dL — ABNORMAL HIGH (ref 0.50–1.05)
GLUCOSE: 101 mg/dL — AB (ref 65–99)
Potassium: 4.3 mmol/L (ref 3.5–5.3)
SODIUM: 142 mmol/L (ref 135–146)

## 2015-01-09 LAB — HEPATIC FUNCTION PANEL
ALT: 15 U/L (ref 6–29)
AST: 17 U/L (ref 10–35)
Albumin: 4.5 g/dL (ref 3.6–5.1)
Alkaline Phosphatase: 73 U/L (ref 33–130)
BILIRUBIN DIRECT: 0.1 mg/dL (ref <=0.2)
BILIRUBIN INDIRECT: 0.4 mg/dL (ref 0.2–1.2)
BILIRUBIN TOTAL: 0.5 mg/dL (ref 0.2–1.2)
Total Protein: 7.1 g/dL (ref 6.1–8.1)

## 2015-01-09 NOTE — Assessment & Plan Note (Signed)
Continue SBE prophylaxis. 

## 2015-01-09 NOTE — Assessment & Plan Note (Signed)
Continue SBE prophylaxis. Moderate mitral regurgitation on most recent echocardiogram. She will need follow-up studies in the future.

## 2015-01-09 NOTE — Assessment & Plan Note (Signed)
Continue statin. Check lipids and liver. 

## 2015-01-09 NOTE — Assessment & Plan Note (Signed)
Continue aspirin and statin. 

## 2015-01-09 NOTE — Assessment & Plan Note (Signed)
Followed by electrophysiology. 

## 2015-01-09 NOTE — Patient Instructions (Signed)
Your physician wants you to follow-up in: 6 MONTHS WITH DR CRENSHAW You will receive a reminder letter in the mail two months in advance. If you don't receive a letter, please call our office to schedule the follow-up appointment.   Your physician recommends that you HAVE LAB WORK TODAY 

## 2015-01-09 NOTE — Assessment & Plan Note (Signed)
Blood pressure controlled. Continue present medications. 

## 2015-01-09 NOTE — Assessment & Plan Note (Signed)
Continue ACE inhibitor and beta blocker. 

## 2015-01-09 NOTE — Assessment & Plan Note (Signed)
Euvolemic on examination. Continue present dose of diuretics. Check potassium and renal function. I have asked her to weigh herself daily and she will take an additional 40 mg of Lasix for weight gain of 2-3 pounds. We also discussed low-sodium diet. Continue ACE inhibitor and beta blocker.

## 2015-01-10 ENCOUNTER — Encounter: Payer: Self-pay | Admitting: *Deleted

## 2015-01-13 ENCOUNTER — Ambulatory Visit (INDEPENDENT_AMBULATORY_CARE_PROVIDER_SITE_OTHER): Payer: Medicaid Other | Admitting: *Deleted

## 2015-01-13 DIAGNOSIS — I519 Heart disease, unspecified: Secondary | ICD-10-CM

## 2015-01-13 DIAGNOSIS — I428 Other cardiomyopathies: Secondary | ICD-10-CM

## 2015-01-13 DIAGNOSIS — I429 Cardiomyopathy, unspecified: Secondary | ICD-10-CM | POA: Diagnosis not present

## 2015-01-13 DIAGNOSIS — Z95810 Presence of automatic (implantable) cardiac defibrillator: Secondary | ICD-10-CM | POA: Diagnosis not present

## 2015-01-13 LAB — CUP PACEART INCLINIC DEVICE CHECK
Battery Voltage: 3.11 V
HIGH POWER IMPEDANCE MEASURED VALUE: 71 Ohm
HighPow Impedance: 380 Ohm
HighPow Impedance: 57 Ohm
Lead Channel Pacing Threshold Pulse Width: 0.4 ms
Lead Channel Sensing Intrinsic Amplitude: 17.25 mV
Lead Channel Sensing Intrinsic Amplitude: 17.625 mV
Lead Channel Setting Pacing Pulse Width: 0.4 ms
Lead Channel Setting Sensing Sensitivity: 0.3 mV
MDC IDC MSMT LEADCHNL RV IMPEDANCE VALUE: 437 Ohm
MDC IDC MSMT LEADCHNL RV PACING THRESHOLD AMPLITUDE: 0.75 V
MDC IDC SESS DTM: 20160926125643
MDC IDC SET LEADCHNL RV PACING AMPLITUDE: 2.5 V
MDC IDC SET ZONE DETECTION INTERVAL: 350 ms
MDC IDC STAT BRADY RV PERCENT PACED: 1.29 %
Zone Setting Detection Interval: 280 ms
Zone Setting Detection Interval: 360 ms

## 2015-01-13 NOTE — Progress Notes (Signed)
ICD check in clinic. Normal device function. Threshold and sensing consistent with previous device measurements. Impedance trends stable over time. No evidence of any ventricular arrhythmias. Histogram distribution appropriate for patient and level of activity. OptiVol abnormal since 01/05/15, pt notices PE, admits to inc'd salt intake. Furosemide inc'd to 120mg  in morning only x3d. No changes made this session. Device programmed at appropriate safety margins. Device programmed to optimize intrinsic conduction. Remaining power 3.11V. Alert tone not demonstrated for patient, pt know to call clinic if heard. Carelink ordered w/ WireX to be sent to clinic, not residence. ROV w/ JA in 3mo.   Will enroll in [facility] clinic upon equipment arrival.

## 2015-01-15 ENCOUNTER — Telehealth: Payer: Self-pay
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
## 2015-01-17 ENCOUNTER — Encounter: Payer: Self-pay | Admitting: Cardiology

## 2015-01-20 ENCOUNTER — Telehealth: Payer: Self-pay | Admitting: *Deleted

## 2015-01-20 NOTE — Telephone Encounter (Signed)
Pt felt better after temp increase in furosemide. Pt did not weigh herself but did notice her clothes fitting better. Pt aware to return to original furosemide regime.   Pt aware I will call her once her Staci Acosta arrives at the clinic

## 2015-02-01 ENCOUNTER — Other Ambulatory Visit: Payer: Self-pay | Admitting: Cardiology

## 2015-02-03 ENCOUNTER — Encounter: Payer: Self-pay | Admitting: Internal Medicine

## 2015-02-10 ENCOUNTER — Other Ambulatory Visit: Payer: Self-pay | Admitting: Cardiology

## 2015-02-10 NOTE — Telephone Encounter (Signed)
REFILL 

## 2015-02-20 ENCOUNTER — Ambulatory Visit (INDEPENDENT_AMBULATORY_CARE_PROVIDER_SITE_OTHER): Payer: Medicaid Other | Admitting: *Deleted

## 2015-02-20 DIAGNOSIS — I428 Other cardiomyopathies: Secondary | ICD-10-CM

## 2015-02-20 DIAGNOSIS — I429 Cardiomyopathy, unspecified: Secondary | ICD-10-CM

## 2015-02-21 ENCOUNTER — Encounter: Payer: Medicaid Other | Admitting: *Deleted

## 2015-02-27 NOTE — Progress Notes (Signed)
Demonstration of Carelink + WireX setup performed (N/C). Patient voiced understanding of all information provided. Plan to follow up as scheduled.

## 2015-03-08 ENCOUNTER — Other Ambulatory Visit: Payer: Self-pay | Admitting: Internal Medicine

## 2015-03-08 ENCOUNTER — Other Ambulatory Visit: Payer: Self-pay | Admitting: Cardiology

## 2015-03-10 ENCOUNTER — Other Ambulatory Visit: Payer: Self-pay | Admitting: Internal Medicine

## 2015-03-10 ENCOUNTER — Other Ambulatory Visit: Payer: Self-pay | Admitting: Cardiology

## 2015-03-24 ENCOUNTER — Ambulatory Visit (INDEPENDENT_AMBULATORY_CARE_PROVIDER_SITE_OTHER): Payer: Medicaid Other | Admitting: Internal Medicine

## 2015-03-24 ENCOUNTER — Encounter: Payer: Self-pay | Admitting: Internal Medicine

## 2015-03-24 VITALS — BP 100/72 | HR 65 | Ht 65.5 in | Wt 192.8 lb

## 2015-03-24 DIAGNOSIS — I428 Other cardiomyopathies: Secondary | ICD-10-CM | POA: Diagnosis not present

## 2015-03-24 DIAGNOSIS — R55 Syncope and collapse: Secondary | ICD-10-CM

## 2015-03-24 DIAGNOSIS — I519 Heart disease, unspecified: Secondary | ICD-10-CM | POA: Diagnosis not present

## 2015-03-24 DIAGNOSIS — I429 Cardiomyopathy, unspecified: Secondary | ICD-10-CM | POA: Diagnosis not present

## 2015-03-24 NOTE — Progress Notes (Signed)
PCP:  [name], MD   Primary Cardiologist:  [name] is a 54 y.o. female who presents today for routine electrophysiology followup.  Since last being seen in our clinic, the patient reports doing reasonably well. She denies SOB at this time.  Today, she denies symptoms of palpitations, chest pain,  lower extremity edema, dizziness, presyncope, syncope, or ICD shocks.  The patient is otherwise without complaint today.   Past Medical History  Diagnosis Date  . Chronic systolic heart failure ([place])   . Pulmonary nodule, left     f/u chest CT 2/12: interval clearing of RUL air space nodule, borderline enlarged mediastinal lymph nodes stable, pul. arterial HTN  . Ovarian cyst     ovarian cystic mass  . Cocaine abuse     resolved  . Alcohol abuse     resolved  . Pulmonary hypertension ([place])   . 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 Roxan Hockey)  . Mitral stenosis   . Nonischemic cardiomyopathy ([place])     a. cath 05/2011 showed a 70% D1, a 50% OM1, a 50-60% left circumflex and a 20% right coronary artery. Ejection fraction was 20%. There was mild MR and severe AI. There was mild pulmonary hypertension; b.echocardiogram 2/13 EF 20-25%, restrictive filling pattern, mod AS with mod  to severe AI. 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 mod to severe MR, mod LAE, mild RVE, mild RAE, mod TR  . Cannabis abuse   . Fibroids     abd. CT 2/12: large fibroid uterus  . Personal history of noncompliance with medical treatment, presenting hazards to health   . COPD (chronic obstructive pulmonary disease) ([place]) 12/27/2008        . Anxiety   . Ventricular tachycardia ([facility])     LifeVest  . Hypertension   . High cholesterol   . ICD (implantable cardiac defibrillator) in place   . History of blood transfusion 1980's    "after tubal pregnancy"  . CHF (congestive heart failure) ([place])   . Shortness of breath     "at  rest" (01/04/2012)   Past Surgical History  Procedure Laterality Date  . Tee without cardioversion  06/08/2011    Procedure: TRANSESOPHAGEAL ECHOCARDIOGRAM (TEE);  Surgeon: Lelon Perla, MD;  Location: [place];  Service: Cardiovascular;  Laterality: N/A;  . Aortic valve replacement  08/04/2011    Procedure: AORTIC VALVE REPLACEMENT (AVR);  Surgeon: Melrose Nakayama, MD;  Location: [place];  Service: Open Heart Surgery;  Laterality: N/A;  WITH ROOT ENLARGEMENT  . Mitral valve repair  08/04/2011    Procedure: MITRAL VALVE REPAIR (MVR);  Surgeon: Melrose Nakayama, MD;  Location: [place];  Service: Open Heart Surgery;  Laterality: N/A;  . Cardiac defibrillator placement  01/04/2012    MDT Protecta XT VR ICD implanted by Dr Rayann Heman  . Cardiac valve replacement    . Tonsillectomy  ~ 1972  . Ectopic pregnancy surgery  1980's  . Cardiac catheterization  2008; ~ 2011;  05/2011  . Left and right heart catheterization with coronary angiogram N/A 06/07/2011    Procedure: LEFT AND RIGHT HEART CATHETERIZATION WITH CORONARY ANGIOGRAM;  Surgeon: Peter M Martinique, MD;  Location[facility] C[facility] LAB;  Service: Cardiovascular;  Laterality: N/A;  . Implantable cardioverter defibrillator implant N/A 01/04/2012    Procedure: IMPLANTABLE CARDIOVERTER DEFIBRILLATOR IMPLANT;  Surgeon: Thompson Grayer, MD;  Location: [facility] CATH LAB;  Service: Cardiovascular;  Laterality: N/A;    Current Outpatient Prescriptions  Medication Sig Dispense Refill  . aspirin EC 325 MG tablet Take 325 mg by mouth daily.    Marland Kitchen atorvastatin (LIPITOR) 80 MG tablet Take 1 tablet (80 mg total) by mouth daily. 90 tablet 3  . carvedilol (COREG) 12.5 MG tablet Take 1 tablet (12.5 mg total) by mouth 2 (two) times daily with a meal. 60 tablet 9  . furosemide (LASIX) 40 MG tablet TAKE TWO TABLETS BY MOUTH TWICE DAILY 120 tablet 6  . KLOR-CON M20 20 MEQ tablet TAKE ONE TABLET BY MOUTH TWICE DAILY 60 tablet 11  . lisinopril (PRINIVIL,ZESTRIL) 5 MG tablet TAKE  ONE TABLET BY MOUTH ONCE DAILY 90 tablet 0  . spironolactone (ALDACTONE) 25 MG tablet TAKE ONE TABLET BY MOUTH ONCE DAILY 90 tablet 0  . [DISCONTINUED] budesonide-formoterol (SYMBICORT) 160-4.5 MCG/ACT inhaler Inhale 2 puffs into the lungs 2 (two) times daily. 1 Inhaler 0  . [DISCONTINUED] tiotropium (SPIRIVA) 18 MCG inhalation capsule Place 1 capsule (18 mcg total) into inhaler and inhale daily. 30 capsule 0   No current facility-administered medications for this visit.    Physical Exam: Filed Vitals:   03/24/15 1550  BP: 100/72  Pulse: 65  Height: 5' 5.5" (1.664 m)  Weight: 192 lb 12.8 oz (87.454 kg)    GEN- The patient is well appearing, alert and oriented x 3 today.   Head- normocephalic, atraumatic Eyes-  Sclera clear, conjunctiva pink Ears- hearing intact Oropharynx- clear Lungs- Clear to ausculation bilaterally, normal work of breathing Chest- ICD pocket is well healed Heart- Regular rate and rhythm, no murmurs, rubs or gallops, PMI not laterally displaced GI- soft, NT, ND, + BS Extremities- no clubbing, cyanosis, or edema  ICD interrogation- reviewed in detail today,  See PACEART report  Assessment and Plan:  1.  Chronic systolic dysfunction euvolemic today Stable on an appropriate medical regimen Normal ICD function See Pace Art report No changes today ICM device clinic  Follow-up with Dr Stanford Breed carelink I will see in a year  Thompson Grayer MD, [facility] 03/24/2015 8:24 PM

## 2015-03-24 NOTE — Patient Instructions (Signed)
Medication Instructions:  Your physician recommends that you continue on your current medications as directed. Please refer to the Current Medication list given to you today.   Labwork: None ordered   Testing/Procedures: None ordered   Follow-Up: Your physician wants you to follow-up in: 12 months with Chanetta Marshall, NP You will receive a reminder letter in the mail two months in advance. If you don't receive a letter, please call our office to schedule the follow-up appointment.  Remote monitoring is used to monitor your  ICD from home. This monitoring reduces the number of office visits required to check your device to one time per year. It allows [place] to keep an eye on the functioning of your device to ensure it is working properly. You are scheduled for a device check from home on 06/23/15. You may send your transmission at any time that day. If you have a wireless device, the transmission will be sent automatically. After your physician reviews your transmission, you will receive a postcard with your next transmission date.     Any Other Special Instructions Will Be Listed Below (If Applicable).     If you need a refill on your cardiac medications before your next appointment, please call your pharmacy.

## 2015-03-26 LAB — CUP PACEART INCLINIC DEVICE CHECK
Brady Statistic RV Percent Paced: 1.77 %
HIGH POWER IMPEDANCE MEASURED VALUE: 456 Ohm
HighPow Impedance: 84 Ohm
Implantable Lead Implant Date: 20130917
Implantable Lead Serial Number: 323765
Lead Channel Pacing Threshold Amplitude: 0.75 V
Lead Channel Setting Pacing Amplitude: 2.5 V
Lead Channel Setting Pacing Pulse Width: 0.4 ms
MDC IDC LEAD LOCATION: 753860
MDC IDC LEAD MODEL: 181
MDC IDC MSMT BATTERY VOLTAGE: 3.13 V
MDC IDC MSMT LEADCHNL RV IMPEDANCE VALUE: 494 Ohm
MDC IDC MSMT LEADCHNL RV PACING THRESHOLD PULSEWIDTH: 0.4 ms
MDC IDC MSMT LEADCHNL RV SENSING INTR AMPL: 20.625 mV
MDC IDC SESS DTM: 20161205213113
MDC IDC SET LEADCHNL RV SENSING SENSITIVITY: 0.3 mV

## 2015-04-24 ENCOUNTER — Telehealth: Payer: Self-pay | Admitting: *Deleted

## 2015-04-24 ENCOUNTER — Ambulatory Visit (INDEPENDENT_AMBULATORY_CARE_PROVIDER_SITE_OTHER): Payer: Medicaid Other

## 2015-04-24 DIAGNOSIS — Z95810 Presence of automatic (implantable) cardiac defibrillator: Secondary | ICD-10-CM | POA: Diagnosis not present

## 2015-04-24 DIAGNOSIS — I5022 Chronic systolic (congestive) heart failure: Secondary | ICD-10-CM

## 2015-04-24 NOTE — Telephone Encounter (Addendum)
Patient scheduled for flu vaccine on 04/29/15 at 2:15 Ryelan Kazee, Orvis Brill, RN

## 2015-04-25 NOTE — Progress Notes (Signed)
EPIC Encounter for ICM Monitoring  Patient Name: [name] is a 55 y.o. female Date: 04/25/2015 Primary Care Physican: [name], MD Primary Cardiologist: Stanford Breed Electrophysiologist: Allred Dry Weight: Does not weigh       In the past month, have you:  1. Gained more than 2 pounds in a day or more than 5 pounds in a week? Explained importance of weighing daily.    2. Had changes in your medications (with verification of current medications)? no  3. Had more shortness of breath than is usual for you? no  4. Limited your activity because of shortness of breath? no  5. Not been able to sleep because of shortness of breath? no  6. Had increased swelling in your feet or ankles? no  7. Had symptoms of dehydration (dizziness, dry mouth, increased thirst, decreased urine output) no  8. Had changes in sodium restriction? no  9. Been compliant with medication? Yes   ICM trend: 04/24/2015 - 3 month view   ICM Trend:  1 year view   Follow-up plan: ICM clinic phone appointment on 05/01/2015.  Optivol impedance below baseline since ~04/03/2015 and progressively worsening suggesting fluid retention.  She reported she is feeling SOB for the last few weeks. She stated Dr Stanford Breed has instructed her to take an extra Furosemide 40 mg as needed which correlates with Dr Jacalyn Lefevre last note on 01/09/2015.  She is not compliant with diet and drinks more than 64 oz a day. She stated she eats in restaurants all the time.  Explained restaurant food can have over 2000 to 3000 mg in one meal.  She stated she does not weigh.  She wanted to know if her medication needs to be increased for routine dosage.  Explained taking higher dosages can effect your kidneys and would recommend compliance with <2000 mg of salt daily and no more than 64 oz a day.   She stated she would try to do better.  Advised to call 911 or emergency facility should her symptoms worsen over the weekend.    Lab result Creatinine  1.26 on 01/09/2015 and BUN 33.  Historically Creatinine was 1.26 to 1.46 since 03/25/2014.  Advised to take Lasix 120 mg am and 80 pm x 3 days and to take Klor Con 20 meq tid x 3 days.  After 3 days, return to normal dosage of Lasix 40 mg tablets take 2 tablets by mouth twice daily, Klor Con 20 meq take one tablet by mouth twice daily.  Repeat transmission 05/01/2015.  Advised would send to Dr Stanford Breed and Dr Rayann Heman for review and if any further recommendations, will give her a call back.   Repeat transmission 05/01/2015.     Copy of note sent to patient's primary care physician, primary cardiologist, and device following physician.  [name], RN, CCM 04/25/2015 4:41 PM

## 2015-04-29 ENCOUNTER — Ambulatory Visit: Payer: Medicaid Other

## 2015-05-01 ENCOUNTER — Ambulatory Visit (INDEPENDENT_AMBULATORY_CARE_PROVIDER_SITE_OTHER): Payer: Medicaid Other

## 2015-05-01 DIAGNOSIS — I5022 Chronic systolic (congestive) heart failure: Secondary | ICD-10-CM

## 2015-05-01 DIAGNOSIS — Z95810 Presence of automatic (implantable) cardiac defibrillator: Secondary | ICD-10-CM

## 2015-05-02 NOTE — Progress Notes (Signed)
EPIC Encounter for ICM Monitoring  Patient Name: [name] is a 55 y.o. female Date: 05/02/2015 Primary Care Physican: [name], MD Primary Cardiologist: Stanford Breed Electrophysiologist: Allred Dry Weight: Does not weigh       In the past month, have you:  1. Gained more than 2 pounds in a day or more than 5 pounds in a week? Unknown, encouraged her to weigh daily at same time with same clothes and report if she has weight increase of 2-3 lbs overnight or 5 lbs in a week.   2. Had changes in your medications (with verification of current medications)? no  3. Had more shortness of breath than is usual for you? no  4. Limited your activity because of shortness of breath? no  5. Not been able to sleep because of shortness of breath? no  6. Had increased swelling in your feet or ankles? no  7. Had symptoms of dehydration (dizziness, dry mouth, increased thirst, decreased urine output) no  8. Had changes in sodium restriction? no  9. Been compliant with medication? Yes   ICM trend: 3 month view   ICM trend: 1 year view   Follow-up plan: ICM clinic phone appointment on 06/04/2015.  Optivol daily thoracic impedance above baseline since taking increased dosage as recommended at last ICM transmission on 04/24/2015.  She stated the SOB has resolved and she is feeling better.  She stated she did follow a low salt diet in the last week.  Encouraged her to continue on low salt diet.  No changes today and she has returned to taking prescribed dose of Furosemide and Potassium.    Copy of note sent to patient's primary care physician, primary cardiologist, and device following physician.  [name], RN, CCM 05/02/2015 2:55 PM

## 2015-05-06 ENCOUNTER — Ambulatory Visit: Payer: Medicaid Other

## 2015-06-04 ENCOUNTER — Ambulatory Visit (INDEPENDENT_AMBULATORY_CARE_PROVIDER_SITE_OTHER): Payer: Medicaid Other

## 2015-06-04 DIAGNOSIS — I5022 Chronic systolic (congestive) heart failure: Secondary | ICD-10-CM

## 2015-06-04 DIAGNOSIS — Z95810 Presence of automatic (implantable) cardiac defibrillator: Secondary | ICD-10-CM | POA: Diagnosis not present

## 2015-06-05 ENCOUNTER — Telehealth: Payer: Self-pay

## 2015-06-05 NOTE — Progress Notes (Signed)
EPIC Encounter for ICM Monitoring  Patient Name: [name] is a 55 y.o. female Date: 06/05/2015 Primary Care Physican: [name], MD Primary Cardiologist: Stanford Breed Electrophysiologist: Allred Dry Weight: Does not weigh       In the past month, have you:  1. Gained more than 2 pounds in a day or more than 5 pounds in a week? unsure  2. Had changes in your medications (with verification of current medications)? no  3. Had more shortness of breath than is usual for you? no  4. Limited your activity because of shortness of breath? no  5. Not been able to sleep because of shortness of breath? no  6. Had increased swelling in your feet or ankles? no  7. Had symptoms of dehydration (dizziness, dry mouth, increased thirst, decreased urine output) no  8. Had changes in sodium restriction? no  9. Been compliant with medication? Yes   ICM trend: 3 month view    ICM trend: 1 year view   Follow-up plan: ICM clinic phone appointment 07/14/2015.  Optivol thoracic impedance below reference line 05/07/2015 to 05/21/2015 and 05/30/2015 to 06/03/2015 suggesting fluid accumulation.  Thoracic impedance returned to reference line on 06/03/2015.  She denied any fluid symptoms.  Encouraged her to follow low salt diet.  She confirmed she is compliant with meds.  No changes today.    Copy of note sent to patient's primary care physician, primary cardiologist, and device following physician.  [name], RN, CCM 06/05/2015 3:03 PM

## 2015-06-05 NOTE — Telephone Encounter (Signed)
Remote ICM transmission received.  Attempted patient call and left message for return call.   

## 2015-06-06 ENCOUNTER — Other Ambulatory Visit: Payer: Self-pay | Admitting: Internal Medicine

## 2015-06-06 ENCOUNTER — Other Ambulatory Visit: Payer: Self-pay | Admitting: Cardiology

## 2015-06-06 NOTE — Telephone Encounter (Signed)
Spoke with patient.

## 2015-06-06 NOTE — Telephone Encounter (Signed)
Rx(s) sent to pharmacy electronically.  

## 2015-07-14 ENCOUNTER — Ambulatory Visit (INDEPENDENT_AMBULATORY_CARE_PROVIDER_SITE_OTHER): Payer: Medicaid Other | Admitting: *Deleted

## 2015-07-14 DIAGNOSIS — I429 Cardiomyopathy, unspecified: Secondary | ICD-10-CM | POA: Diagnosis not present

## 2015-07-14 DIAGNOSIS — Z95810 Presence of automatic (implantable) cardiac defibrillator: Secondary | ICD-10-CM

## 2015-07-14 DIAGNOSIS — I5022 Chronic systolic (congestive) heart failure: Secondary | ICD-10-CM

## 2015-07-14 DIAGNOSIS — I428 Other cardiomyopathies: Secondary | ICD-10-CM

## 2015-07-14 NOTE — Progress Notes (Signed)
Remote ICD transmission.   

## 2015-07-14 NOTE — Progress Notes (Signed)
EPIC Encounter for ICM Monitoring  Patient Name: [name] is a 54 y.o. female Date: 07/14/2015 Primary Care Physican: [name], MD Primary Cardiologist: Stanford Breed Electrophysiologist: Allred Dry Weight: Does not weigh   In the past month, have you:  1. Gained more than 2 pounds in a day or more than 5 pounds in a week? no  2. Had changes in your medications (with verification of current medications)? no  3. Had more shortness of breath than is usual for you? no  4. Limited your activity because of shortness of breath? no  5. Not been able to sleep because of shortness of breath? no  6. Had increased swelling in your feet or ankles? no  7. Had symptoms of dehydration (dizziness, dry mouth, increased thirst, decreased urine output) no  8. Had changes in sodium restriction? no  9. Been compliant with medication? Yes   ICM trend: 3 month view for 07/14/2015   ICM trend: 1 year view for 07/14/2015   Follow-up plan: ICM clinic phone appointment on 07/18/2015.  Thoracic impedance below reference line from 07/05/2015 to 07/14/2015 (today) suggesting fluid accumulation.  Patient denied any fluid symptoms.   Patient unsure why fluid retention.  She stated she is feeling fine at this time.  Reminded her to follow low salt diet.  Lab result 01/09/2015 Creatinine 1.26 and BUN 33.  Historically Creatinine was 1.26 to 1.46 since 03/25/2016  Recommended to increase Furosemide to 120 mg twice a day for 2 days then return to prescribed dosage of 80 mg bid.  Increase Klor Con 20 meq to tid x 2 days and return to prescribed dosage of bid.      Advised will send to Dr Stanford Breed and Dr Rayann Heman for review and if any further recommendations, will call her back.  Repeat transmission 07/18/2015.  Advised she is currently due for 6 month follow with Dr Stanford Breed and she will call and make the appointment today.    Copy of note sent to patient's primary care physician, primary cardiologist, and  device following physician.  [name], RN, CCM 07/14/2015 9:43 AM

## 2015-07-17 NOTE — Progress Notes (Signed)
HPI: FU AVR secondary to aortic stenosis and CAD. Repeat echocardiogram 2/13 showed an ejection fraction of 20-25%, restrictive filling pattern, moderate aortic stenosis with moderate to severe aortic insufficiency. 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 There was moderate to severe mitral regurgitation. There was moderate left atrial enlargement, mild right ventricular and right atrial enlargement and moderate tricuspid regurgitation. Cardiac catheterization in February of 2012 showed a 70% first diagonal, a 50% first marginal, a 50-60% left circumflex and a 20% right coronary artery. Ejection fraction was 20%. There was mild mitral regurgitation and severe aortic insufficiency. There was mild pulmonary hypertension. Patient had AVR 58mm Edwards Magna Ease Pericardial Valve Conduit Aortic Root Enlargement utilizing a Hemashield Graft; MV Annuloplasty utilizing 28mm Edwards Physio II Ring in April of 2013. Had ICD placed. Echo 12/15 showed EF 20-25 bioprosthetic aortic valve, previous MV repair, moderate MR, biatrial enlargement, mild RVE, severe TR, moderate PI, moderate to severe pulmonary hypertension. Since she was last seen, She has mild dyspnea on exertion at times. No orthopnea, PND, pedal edema, chest pain or syncope  Current Outpatient Prescriptions  Medication Sig Dispense Refill  . aspirin EC 325 MG tablet Take 325 mg by mouth daily.    Marland Kitchen atorvastatin (LIPITOR) 80 MG tablet Take 1 tablet (80 mg total) by mouth daily. 90 tablet 3  . carvedilol (COREG) 12.5 MG tablet Take 1 tablet (12.5 mg total) by mouth 2 (two) times daily with a meal. 60 tablet 9  . furosemide (LASIX) 40 MG tablet TAKE TWO TABLETS BY MOUTH TWICE DAILY 120 tablet 6  . KLOR-CON M20 20 MEQ tablet TAKE ONE TABLET BY MOUTH TWICE DAILY 60 tablet 11  . lisinopril (PRINIVIL,ZESTRIL) 5 MG tablet TAKE ONE TABLET BY MOUTH ONCE DAILY 90 tablet 2  . spironolactone (ALDACTONE) 25 MG tablet TAKE ONE TABLET BY MOUTH ONCE DAILY 90  tablet 2  . [DISCONTINUED] budesonide-formoterol (SYMBICORT) 160-4.5 MCG/ACT inhaler Inhale 2 puffs into the lungs 2 (two) times daily. 1 Inhaler 0  . [DISCONTINUED] tiotropium (SPIRIVA) 18 MCG inhalation capsule Place 1 capsule (18 mcg total) into inhaler and inhale daily. 30 capsule 0   No current facility-administered medications for this visit.     Past Medical History  Diagnosis Date  . Chronic systolic heart failure ([place])   . Pulmonary nodule, left     f/u chest CT 2/12: interval clearing of RUL air space nodule, borderline enlarged mediastinal lymph nodes stable, pul. arterial HTN  . Ovarian cyst     ovarian cystic mass  . Cocaine abuse     resolved  . Alcohol abuse     resolved  . Pulmonary hypertension ([place])   . 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 Roxan Hockey)  . Mitral stenosis   . Nonischemic cardiomyopathy ([place])     a. cath 05/2011 showed a 70% D1, a 50% OM1, a 50-60% left circumflex and a 20% right coronary artery. Ejection fraction was 20%. There was mild MR and severe AI. There was mild pulmonary hypertension; b.echocardiogram 2/13 EF 20-25%, restrictive filling pattern, mod AS with mod  to severe AI. 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 mod to severe MR, mod LAE, mild RVE, mild RAE, mod TR  . Cannabis abuse   . Fibroids     abd. CT 2/12: large fibroid uterus  . Personal history of noncompliance with medical treatment, presenting hazards to health   . COPD (  chronic obstructive pulmonary disease) ([place]) 12/27/2008        . Anxiety   . Ventricular tachycardia ([facility])     LifeVest  . Hypertension   . High cholesterol   . ICD (implantable cardiac defibrillator) in place   . History of blood transfusion 1980's    "after tubal pregnancy"  . CHF (congestive heart failure) ([place])   . Shortness of breath     "at rest" (01/04/2012)    Past Surgical History  Procedure Laterality Date  . Tee without  cardioversion  06/08/2011    Procedure: TRANSESOPHAGEAL ECHOCARDIOGRAM (TEE);  Surgeon: Lelon Perla, MD;  Location: [facility];  Service: Cardiovascular;  Laterality: N/A;  . Aortic valve replacement  08/04/2011    Procedure: AORTIC VALVE REPLACEMENT (AVR);  Surgeon: Melrose Nakayama, MD;  Location: [facility];  Service: Open Heart Surgery;  Laterality: N/A;  WITH ROOT ENLARGEMENT  . Mitral valve repair  08/04/2011    Procedure: MITRAL VALVE REPAIR (MVR);  Surgeon: Melrose Nakayama, MD;  Location: [facility];  Service: Open Heart Surgery;  Laterality: N/A;  . Cardiac defibrillator placement  01/04/2012    MDT Protecta XT VR ICD implanted by Dr Rayann Heman  . Cardiac valve replacement    . Tonsillectomy  ~ 1972  . Ectopic pregnancy surgery  1980's  . Cardiac catheterization  2008; ~ 2011;  05/2011  . Left and right heart catheterization with coronary angiogram N/A 06/07/2011    Procedure: LEFT AND RIGHT HEART CATHETERIZATION WITH CORONARY ANGIOGRAM;  Surgeon: Peter M Martinique, MD;  Location[facility] CATH LAB;  Service: Cardiovascular;  Laterality: N/A;  . Implantable cardioverter defibrillator implant N/A 01/04/2012    Procedure: IMPLANTABLE CARDIOVERTER DEFIBRILLATOR IMPLANT;  Surgeon: Thompson Grayer, MD;  Location: [facility] CATH LAB;  Service: Cardiovascular;  Laterality: N/A;    Social History   Social History  . Marital Status: Single    Spouse Name: N/A  . Number of Children: N/A  . Years of Education: N/A   Occupational History  . Not on file.   Social History Main Topics  . Smoking status: Former Smoker -- 1.00 packs/day for 10 years    Types: Cigarettes    Quit date: 05/27/2010  . Smokeless tobacco: Never Used  . Alcohol Use: 8.4 oz/week    14 Shots of liquor per week     Comment: drinks 1-2 shots of tequila most days  . Drug Use: Yes    Special: Marijuana, Cocaine     Comment: occasional marijuana; no longer using cocaine  . Sexual Activity: Yes   Other Topics Concern  . Not on file    Social History Narrative   Single no children   Tobacco Use - Hx of x-20 years   History Cocaine and Mariguana use    Family History  Problem Relation Age of Onset  . Coronary artery disease    . Hypertension Mother   . Heart disease Mother   . COPD Mother   . Cholecystitis Mother   . Heart attack Mother   . Anesthesia problems Neg Hx   . Cancer Brother   . Heart disease Maternal Grandmother   . Heart attack Maternal Grandmother     ROS: no fevers or chills, productive cough, hemoptysis, dysphasia, odynophagia, melena, hematochezia, dysuria, hematuria, rash, seizure activity, orthopnea, PND, pedal edema, claudication. Remaining systems are negative.  Physical Exam: [name] well-nourished in no acute distress.  Skin is warm and dry.  HEENT is normal.  Neck is supple.  Chest is clear to auscultation with normal expansion.  Cardiovascular exam is regular rate and rhythm. 2/6 systolic murmur left sternal border. No diastolic murmur. Abdominal exam nontender or distended. No masses palpated. Extremities show no edema. neuro grossly intact  ECG Sinus rhythm with occasional PAC and PVC, inferior lateral T-wave inversion.

## 2015-07-18 ENCOUNTER — Ambulatory Visit (INDEPENDENT_AMBULATORY_CARE_PROVIDER_SITE_OTHER): Payer: Medicaid Other

## 2015-07-18 DIAGNOSIS — Z95810 Presence of automatic (implantable) cardiac defibrillator: Secondary | ICD-10-CM

## 2015-07-18 DIAGNOSIS — I5022 Chronic systolic (congestive) heart failure: Secondary | ICD-10-CM

## 2015-07-18 NOTE — Progress Notes (Signed)
EPIC Encounter for ICM Monitoring  Patient Name: [name] is a 55 y.o. female Date: 07/18/2015 Primary Care Physican: [name], MD Primary Cardiologist: Stanford Breed Electrophysiologist: Allred Dry Weight: Does not weigh   In the past month, have you:  1. Gained more than 2 pounds in a day or more than 5 pounds in a week? no  2. Had changes in your medications (with verification of current medications)? no  3. Had more shortness of breath than is usual for you? no  4. Limited your activity because of shortness of breath? no  5. Not been able to sleep because of shortness of breath? no  6. Had increased swelling in your feet or ankles? no  7. Had symptoms of dehydration (dizziness, dry mouth, increased thirst, decreased urine output) no  8. Had changes in sodium restriction? no  9. Been compliant with medication? Yes   ICM trend: 3 month view for 07/18/2015  ICM trend: 1 year view for 07/18/2015   Follow-up plan: ICM clinic phone appointment on 08/18/2015 and appointment with Dr Stanford Breed 07/21/2015.  Thoracic impedance returned to reference line 07/18/2015 suggesting which correlates with increasing Furosemide dosage x 2 days on 07/14/2015.  Patient stated she is feeling better and did not even realize that she was feeling bad.  Reminder to limit sodium intake to < 2000 mg and fluid intake to 64 oz daily.  Encouraged to call for any fluid symptoms.  No changes today.    Advised would send to Dr Stanford Breed for review since she has an appointment on 07/21/2015.    Copy of note sent to patient's primary care physician, primary cardiologist, and device following physician.  [name], RN, CCM 07/18/2015 8:16 AM

## 2015-07-21 ENCOUNTER — Encounter: Payer: Self-pay | Admitting: Cardiology

## 2015-07-21 ENCOUNTER — Ambulatory Visit (INDEPENDENT_AMBULATORY_CARE_PROVIDER_SITE_OTHER): Payer: Medicaid Other | Admitting: Cardiology

## 2015-07-21 DIAGNOSIS — I059 Rheumatic mitral valve disease, unspecified: Secondary | ICD-10-CM

## 2015-07-21 DIAGNOSIS — Z95810 Presence of automatic (implantable) cardiac defibrillator: Secondary | ICD-10-CM | POA: Diagnosis not present

## 2015-07-21 DIAGNOSIS — I5043 Acute on chronic combined systolic (congestive) and diastolic (congestive) heart failure: Secondary | ICD-10-CM

## 2015-07-21 DIAGNOSIS — I2510 Atherosclerotic heart disease of native coronary artery without angina pectoris: Secondary | ICD-10-CM | POA: Diagnosis not present

## 2015-07-21 LAB — HEPATIC FUNCTION PANEL
ALBUMIN: 4.5 g/dL (ref 3.6–5.1)
ALK PHOS: 88 U/L (ref 33–130)
ALT: 12 U/L (ref 6–29)
AST: 16 U/L (ref 10–35)
BILIRUBIN TOTAL: 0.5 mg/dL (ref 0.2–1.2)
Bilirubin, Direct: 0.1 mg/dL (ref <=0.2)
Indirect Bilirubin: 0.4 mg/dL (ref 0.2–1.2)
TOTAL PROTEIN: 7.1 g/dL (ref 6.1–8.1)

## 2015-07-21 LAB — LIPID PANEL
CHOL/HDL RATIO: 3.2 ratio (ref <=5.0)
CHOLESTEROL: 151 mg/dL (ref 125–200)
HDL: 47 mg/dL (ref >=46)
LDL Cholesterol: 89 mg/dL (ref <130)
TRIGLYCERIDES: 73 mg/dL (ref <150)
VLDL: 15 mg/dL (ref <30)

## 2015-07-21 LAB — BASIC METABOLIC PANEL
BUN: 33 mg/dL — AB (ref 7–25)
CALCIUM: 9.1 mg/dL (ref 8.6–10.4)
CO2: 24 mmol/L (ref 20–31)
CREATININE: 1.42 mg/dL — AB (ref 0.50–1.05)
Chloride: 102 mmol/L (ref 98–110)
Glucose, Bld: 108 mg/dL — ABNORMAL HIGH (ref 65–99)
Potassium: 4.7 mmol/L (ref 3.5–5.3)
Sodium: 136 mmol/L (ref 135–146)

## 2015-07-21 MED ORDER — FUROSEMIDE 40 MG PO TABS: ORAL_TABLET | ORAL | Status: DC

## 2015-07-21 NOTE — Assessment & Plan Note (Signed)
Followed by electrophysiology. 

## 2015-07-21 NOTE — Assessment & Plan Note (Signed)
Blood pressure controlled. Continue present medications. 

## 2015-07-21 NOTE — Assessment & Plan Note (Signed)
Mild increased dyspnea on exertion. Increase Lasix to 120 mg in the morning and 80 mg in the evening. Continue spironolactone. Check potassium and renal function. Patient educated on low sodium diet and fluid restriction.

## 2015-07-21 NOTE — Assessment & Plan Note (Signed)
Continue aspirin and statin. 

## 2015-07-21 NOTE — Patient Instructions (Signed)
Medication Instructions:   INCREASE FUROSEMIDE TO 120 MG IN THE MORNING AND 80 MG IN THE AFTERNOON= 3 TABLETS IN THE AM AND 2 TABLETS IN THE PM  Labwork:  Your physician recommends that you HAVE LAB WORK TODAY  Testing/Procedures:  Your physician has requested that you have an echocardiogram. Echocardiography is a painless test that uses sound waves to create images of your heart. It provides your doctor with information about the size and shape of your heart and how well your heart's chambers and valves are working. This procedure takes approximately one hour. There are no restrictions for this procedure.    Follow-Up:  Your physician wants you to follow-up in: [place] will receive a reminder letter in the mail two months in advance. If you don't receive a letter, please call our office to schedule the follow-up appointment.

## 2015-07-21 NOTE — Assessment & Plan Note (Signed)
Continue SBE prophylaxis. Last echocardiogram showed moderate mitral regurgitation. Repeat study.

## 2015-07-21 NOTE — Assessment & Plan Note (Signed)
Continue SBE prophylaxis. 

## 2015-07-21 NOTE — Assessment & Plan Note (Signed)
Continue ACE inhibitor and beta blocker. 

## 2015-07-21 NOTE — Assessment & Plan Note (Signed)
Continue statin. Check lipids and liver. 

## 2015-07-22 ENCOUNTER — Encounter: Payer: Self-pay | Admitting: *Deleted

## 2015-07-24 LAB — CUP PACEART REMOTE DEVICE CHECK
Battery Voltage: 3.1 V
Date Time Interrogation Session: 20170327073328
HIGH POWER IMPEDANCE MEASURED VALUE: 70 Ohm
HighPow Impedance: 399 Ohm
Implantable Lead Model: 181
Lead Channel Sensing Intrinsic Amplitude: 17.25 mV
Lead Channel Setting Pacing Amplitude: 2.5 V
Lead Channel Setting Pacing Pulse Width: 0.4 ms
Lead Channel Setting Sensing Sensitivity: 0.3 mV
MDC IDC LEAD IMPLANT DT: 20130917
MDC IDC LEAD LOCATION: 753860
MDC IDC LEAD SERIAL: 323765
MDC IDC MSMT LEADCHNL RV IMPEDANCE VALUE: 437 Ohm
MDC IDC MSMT LEADCHNL RV PACING THRESHOLD AMPLITUDE: 0.875 V
MDC IDC MSMT LEADCHNL RV PACING THRESHOLD PULSEWIDTH: 0.4 ms
MDC IDC MSMT LEADCHNL RV SENSING INTR AMPL: 17.25 mV
MDC IDC STAT BRADY RV PERCENT PACED: 2.14 %

## 2015-07-29 ENCOUNTER — Encounter: Payer: Self-pay | Admitting: Cardiology

## 2015-07-30 ENCOUNTER — Other Ambulatory Visit: Payer: Self-pay | Admitting: Cardiology

## 2015-07-30 NOTE — Telephone Encounter (Signed)
Rx(s) sent to pharmacy electronically.  

## 2015-08-08 ENCOUNTER — Other Ambulatory Visit: Payer: Self-pay | Admitting: Cardiology

## 2015-08-08 NOTE — Telephone Encounter (Signed)
REFILL 

## 2015-08-11 ENCOUNTER — Ambulatory Visit (HOSPITAL_COMMUNITY): Payer: Medicaid Other | Attending: Cardiovascular Disease

## 2015-08-11 ENCOUNTER — Other Ambulatory Visit: Payer: Self-pay

## 2015-08-11 DIAGNOSIS — I059 Rheumatic mitral valve disease, unspecified: Secondary | ICD-10-CM

## 2015-08-11 DIAGNOSIS — I429 Cardiomyopathy, unspecified: Secondary | ICD-10-CM | POA: Diagnosis not present

## 2015-08-11 DIAGNOSIS — I517 Cardiomegaly: Secondary | ICD-10-CM | POA: Diagnosis not present

## 2015-08-11 DIAGNOSIS — I5022 Chronic systolic (congestive) heart failure: Secondary | ICD-10-CM | POA: Insufficient documentation

## 2015-08-18 ENCOUNTER — Ambulatory Visit (INDEPENDENT_AMBULATORY_CARE_PROVIDER_SITE_OTHER): Payer: Medicaid Other

## 2015-08-18 DIAGNOSIS — Z95810 Presence of automatic (implantable) cardiac defibrillator: Secondary | ICD-10-CM | POA: Diagnosis not present

## 2015-08-18 DIAGNOSIS — I5022 Chronic systolic (congestive) heart failure: Secondary | ICD-10-CM

## 2015-08-18 NOTE — Progress Notes (Signed)
EPIC Encounter for ICM Monitoring  Patient Name: [name] is a 55 y.o. female Date: 08/18/2015 Primary Care Physican: [name], MD Primary Cardiologist: Stanford Breed Electrophysiologist: Allred Dry Weight: 192 lbs in office   In the past month, have you:  1. Gained more than 2 pounds in a day or more than 5 pounds in a week? no  2. Had changes in your medications (with verification of current medications)? Yes, Furosemide increased to 120 mg am and 80 mg pm.    3. Had more shortness of breath than is usual for you? no  4. Limited your activity because of shortness of breath? no  5. Not been able to sleep because of shortness of breath? no  6. Had increased swelling in your feet or ankles? no  7. Had symptoms of dehydration (dizziness, dry mouth, increased thirst, decreased urine output) no  8. Had changes in sodium restriction? no  9. Been compliant with medication? Yes  ICM trend: 3 month view for 08/18/2015  ICM trend: 1 year view for 08/18/2015    Follow-up plan: ICM clinic phone appointment 09/18/2015.    FLUID LEVELS: Optivol thoracic impedance decreased 08/04/2015 to 08/10/2015 suggesting fluid accumulation and returned to reference line 08/11/2015 suggesting stable fluid levels.   SYMPTOMS: None. Encouraged to call for any fluid symptoms.  No changes today.     [name], RN, CCM 08/18/2015 1:06 PM

## 2015-09-18 ENCOUNTER — Ambulatory Visit (INDEPENDENT_AMBULATORY_CARE_PROVIDER_SITE_OTHER): Payer: Medicaid Other

## 2015-09-18 DIAGNOSIS — Z95810 Presence of automatic (implantable) cardiac defibrillator: Secondary | ICD-10-CM | POA: Diagnosis not present

## 2015-09-18 DIAGNOSIS — I5022 Chronic systolic (congestive) heart failure: Secondary | ICD-10-CM

## 2015-09-19 ENCOUNTER — Telehealth: Payer: Self-pay

## 2015-09-19 NOTE — Progress Notes (Signed)
EPIC Encounter for ICM Monitoring  Patient Name: [name] is a 55 y.o. female Date: 09/19/2015 Primary Care Physican: [name], MD Primary Cardiologist: Stanford Breed Electrophysiologist: Allred Dry Weight: unknown   In the past month, have you:  1. Gained more than 2 pounds in a day or more than 5 pounds in a week? N/A  2. Had changes in your medications (with verification of current medications)? N/A  3. Had more shortness of breath than is usual for you? N/A  4. Limited your activity because of shortness of breath? N/A  5. Not been able to sleep because of shortness of breath? N/A  6. Had increased swelling in your feet or ankles? N/A  7. Had symptoms of dehydration (dizziness, dry mouth, increased thirst, decreased urine output) N/A  8. Had changes in sodium restriction? N/A  9. Been compliant with medication? N/A   ICM trend: 3 month view for 09/18/2015         ICM trend: 1 year view for 09/18/2015   Follow-up plan: ICM clinic phone appointment on 10/23/2015.  Attempted call to patient and unable to reach.  Transmission reviewed.  Thoracic impedance trending close to baseline with a few days of decreased impedance in last month.      [name], RN, CCM 09/19/2015 10:42 AM

## 2015-09-19 NOTE — Progress Notes (Signed)
Received call back from patient.  Reviewed transmission and she denied any fluid symptoms.  She stated she is feeling good at this time.  Weight is stable.  No changes in meds and taking as prescribed.  Next remote transmission scheduled for 10/23/2015.

## 2015-09-19 NOTE — Telephone Encounter (Signed)
Remote ICM transmission received.  Attempted patient call and left message for return call.   

## 2015-10-23 ENCOUNTER — Ambulatory Visit (INDEPENDENT_AMBULATORY_CARE_PROVIDER_SITE_OTHER): Payer: Medicaid Other | Admitting: *Deleted

## 2015-10-23 DIAGNOSIS — I429 Cardiomyopathy, unspecified: Secondary | ICD-10-CM

## 2015-10-23 DIAGNOSIS — I5022 Chronic systolic (congestive) heart failure: Secondary | ICD-10-CM | POA: Diagnosis not present

## 2015-10-23 DIAGNOSIS — I428 Other cardiomyopathies: Secondary | ICD-10-CM

## 2015-10-23 DIAGNOSIS — Z95810 Presence of automatic (implantable) cardiac defibrillator: Secondary | ICD-10-CM | POA: Diagnosis not present

## 2015-10-23 NOTE — Progress Notes (Signed)
Remote ICD transmission.   

## 2015-10-23 NOTE — Progress Notes (Signed)
EPIC Encounter for ICM Monitoring  Patient Name: [name] is a 55 y.o. female Date: 10/23/2015 Primary Care Physican: [name], MD Primary Cardiologist: Stanford Breed Electrophysiologist: Allred Dry Weight:  unknown      Heart Failure questions reviewed, pt asymptomatic  Thoracic impedence stable with slightly under baseline today.  She reported she has been eating salt on home grown tomatoes.  Advised if she uses salt on tomatoes to make sure the rest of the food she consumes in a day is low in salt or no salt.   Recommendations: None  ICM trend:  10/23/2015     Follow-up plan: ICM clinic phone appointment on 11/26/2015.  Copy of ICM check sent to device physician.   [name], RN 10/23/2015 1:47 PM

## 2015-10-28 LAB — CUP PACEART REMOTE DEVICE CHECK
Date Time Interrogation Session: 20170706062830
HIGH POWER IMPEDANCE MEASURED VALUE: 456 Ohm
HighPow Impedance: 79 Ohm
Implantable Lead Location: 753860
Implantable Lead Serial Number: 323765
Lead Channel Pacing Threshold Pulse Width: 0.4 ms
Lead Channel Setting Sensing Sensitivity: 0.3 mV
MDC IDC LEAD IMPLANT DT: 20130917
MDC IDC LEAD MODEL: 181
MDC IDC MSMT BATTERY VOLTAGE: 3.12 V
MDC IDC MSMT LEADCHNL RV IMPEDANCE VALUE: 437 Ohm
MDC IDC MSMT LEADCHNL RV PACING THRESHOLD AMPLITUDE: 0.75 V
MDC IDC MSMT LEADCHNL RV SENSING INTR AMPL: 19.75 mV
MDC IDC MSMT LEADCHNL RV SENSING INTR AMPL: 19.75 mV
MDC IDC SET LEADCHNL RV PACING AMPLITUDE: 2.5 V
MDC IDC SET LEADCHNL RV PACING PULSEWIDTH: 0.4 ms
MDC IDC STAT BRADY RV PERCENT PACED: 3.52 %

## 2015-10-29 ENCOUNTER — Encounter: Payer: Self-pay | Admitting: Cardiology

## 2015-11-26 ENCOUNTER — Ambulatory Visit (INDEPENDENT_AMBULATORY_CARE_PROVIDER_SITE_OTHER): Payer: Medicaid Other

## 2015-11-26 DIAGNOSIS — I5022 Chronic systolic (congestive) heart failure: Secondary | ICD-10-CM | POA: Diagnosis not present

## 2015-11-26 DIAGNOSIS — Z95810 Presence of automatic (implantable) cardiac defibrillator: Secondary | ICD-10-CM

## 2015-11-26 NOTE — Progress Notes (Signed)
EPIC Encounter for ICM Monitoring  Patient Name: [name] is a 55 y.o. female Date: 11/26/2015 Primary Care Physican: [name], MD Primary Cardiologist: Stanford Breed Electrophysiologist: Allred Dry Weight: unknown        Heart Failure questions reviewed, pt asymptomatic   Thoracic impedance abnormal suggesting fluid accumulation 11/16/2015 to 11/26/2015.  She reported having a stomach virus and was not able to take Furosemide for about a week during decreased impedance.  She has resumed the Furosemide in the last 4 days.    Labs: 07/21/2015 Creatinine 1.42, BUN 33, Potassium 4.7, Sodium 136 01/09/2015 Creatinine 1.26, BUN 33, Potassium 4.3, Sodium 142 Since 04/03/2014 Creatinine has ranged 1.16 to 1.42.    Recommendations: Will send to Dr Stanford Breed and Dr Rayann Heman for review and if any recommendations will call her back.  Repeat transmission on 12/10/2015.     ICM trend: 11/26/2015     Follow-up plan: ICM clinic phone appointment on 12/10/2015.  Copy of ICM check sent to primary cardiologist and device physician.   [name], RN 11/26/2015 3:20 PM

## 2015-12-10 ENCOUNTER — Ambulatory Visit (INDEPENDENT_AMBULATORY_CARE_PROVIDER_SITE_OTHER): Payer: Medicaid Other

## 2015-12-10 DIAGNOSIS — Z95810 Presence of automatic (implantable) cardiac defibrillator: Secondary | ICD-10-CM

## 2015-12-10 DIAGNOSIS — I5022 Chronic systolic (congestive) heart failure: Secondary | ICD-10-CM

## 2015-12-10 NOTE — Progress Notes (Signed)
EPIC Encounter for ICM Monitoring  Patient Name: [name] is a 55 y.o. female Date: 12/10/2015 Primary Care Physican: [name], MD Primary Cardiologist: Stanford Breed Electrophysiologist: Allred Dry Weight: Does not weigh daily        Heart Failure questions reviewed, pt asymptomatic.  Thoracic impedance abnormal suggesting fluid accumulation since 11/18/2015.  Labs: 07/21/2015 Creatinine 1.42, BUN 33, Potassium 4.7, Sodium 136 01/09/2015 Creatinine 1.26, BUN 33, Potassium 4.3, Sodium 142 Since 04/03/2014 Creatinine has ranged 1.16 to 1.42    DIET: Discussed dietary restrictions such as sodium and fluid intake.  She does not check how much sodium she is eating a day.  She eats cold cuts and has been drinking gatorade.  Advised Gatorade has sodium and cold cuts are very high in sodium and should be avoided.   Advised to limit salt intake to 2000 mg daily.          Recommendations:  Copy of ICM check sent to Dr Stanford Breed and Dr Rayann Heman for review and any recommendations.  Patient has office appointment with Dr Stanford Breed on 02/09/2016.     Follow-up plan: ICM clinic phone appointment on 12/17/2015.   ICM trend: 12/10/2015       [name], RN 12/10/2015 2:03 PM

## 2015-12-11 MED ORDER — FUROSEMIDE 40 MG PO TABS: ORAL_TABLET | ORAL | 3 refills | Status: DC

## 2015-12-11 NOTE — Progress Notes (Signed)
Reviewed transmission in office with Dr Rayann Heman and he recommended to increase Furosemide 40 mg to 3 tablets (120 mg total) twice a day.  BMET to be drawn within the next 2 weeks.    Call to patient and advised Dr Rayann Heman recommended to increase Furosemide 40 mg to 3 tablets (120 mg total) twice a day.  BMET to be drawn within the next 2 weeks and scheduled for 12/23/2015.  She verbalized understanding.

## 2015-12-17 ENCOUNTER — Ambulatory Visit (INDEPENDENT_AMBULATORY_CARE_PROVIDER_SITE_OTHER): Payer: Medicaid Other

## 2015-12-17 DIAGNOSIS — I5022 Chronic systolic (congestive) heart failure: Secondary | ICD-10-CM

## 2015-12-17 DIAGNOSIS — Z95810 Presence of automatic (implantable) cardiac defibrillator: Secondary | ICD-10-CM

## 2015-12-17 NOTE — Progress Notes (Signed)
EPIC Encounter for ICM Monitoring  Patient Name: [name] is a 55 y.o. female Date: 12/17/2015 Primary Care Physican: [name], MD Primary Cardiologist: Stanford Breed Electrophysiologist: Allred Dry Weight: unknown   Heart Failure questions reviewed, pt asymptomatic  Since increase of Furosemide 40 mg to 120 mg bid, thoracic impedance returned to normal.  She confirmed she will have labs drawn on 12/23/2015.    Labs: 07/21/2015 Creatinine 1.42, BUN 33, Potassium 4.7, Sodium 136 01/09/2015 Creatinine 1.26, BUN 33, Potassium 4.3, Sodium 142 Since 04/03/2014 Creatinine has ranged 1.16 to 1.42    Recommendations: No changes.  BMET on 12/23/2015    Follow-up plan: ICM clinic phone appointment on 01/22/2016.  Copy of ICM check sent to primary cardiologist and device physician.   ICM trend: 12/17/2015       Samantha Billings, RN 12/17/2015 10:40 AM

## 2016-01-01 ENCOUNTER — Other Ambulatory Visit: Payer: Self-pay | Admitting: *Deleted

## 2016-01-01 DIAGNOSIS — I5022 Chronic systolic (congestive) heart failure: Secondary | ICD-10-CM

## 2016-01-01 MED ORDER — FUROSEMIDE 40 MG PO TABS: ORAL_TABLET | ORAL | 2 refills | Status: DC

## 2016-01-22 ENCOUNTER — Encounter: Payer: Medicaid Other | Admitting: *Deleted

## 2016-01-22 ENCOUNTER — Telehealth: Payer: Self-pay | Admitting: Cardiology

## 2016-01-22 NOTE — Telephone Encounter (Signed)
LMOVM reminding pt to send remote transmission.   

## 2016-01-23 ENCOUNTER — Encounter: Payer: Self-pay | Admitting: Cardiology

## 2016-01-23 NOTE — Progress Notes (Signed)
No ICM remote transmission received for 01/22/2016 and next ICM transmission scheduled for 02/05/2016.

## 2016-01-28 ENCOUNTER — Ambulatory Visit (INDEPENDENT_AMBULATORY_CARE_PROVIDER_SITE_OTHER): Payer: Medicaid Other | Admitting: *Deleted

## 2016-01-28 ENCOUNTER — Encounter: Payer: Self-pay | Admitting: Cardiology

## 2016-01-28 DIAGNOSIS — I428 Other cardiomyopathies: Secondary | ICD-10-CM | POA: Diagnosis not present

## 2016-01-29 ENCOUNTER — Encounter: Payer: Self-pay | Admitting: Cardiology

## 2016-01-29 NOTE — Progress Notes (Signed)
Remote ICD transmission.   

## 2016-02-01 NOTE — Progress Notes (Signed)
HPI: FU AVR secondary to aortic stenosis and CAD. Cardiac catheterization in February of 2012 showed a 70% first diagonal, a 50% first marginal, a 50-60% left circumflex and a 20% right coronary artery. Ejection fraction was 20%. There was mild mitral regurgitation and severe aortic insufficiency. There was mild pulmonary hypertension. Patient had AVR 54mm Edwards Magna Ease Pericardial Valve Conduit Aortic Root Enlargement utilizing a Hemashield Graft; MV Annuloplasty utilizing 67mm Edwards Physio II Ring in April of 2013. Had ICD placed. Echocardiogram repeated April 2017 and showed ejection fraction 25%, elevated left ventricular filling pressure, mean aortic valve gradient 14 mmHg, prior mitral valve repair with trivial mitral regurgitation and moderate to severe left atrial enlargement. Note previous echocardiogram had suggested moderate mitral regurgitation, severe tricuspid regurgitation with moderate to severe pulmonary hypertension. Since she was last seen, she denies dyspnea on exertion, orthopnea, PND, pedal edema, chest pain or syncope.  Current Outpatient Prescriptions  Medication Sig Dispense Refill  . aspirin EC 325 MG tablet Take 325 mg by mouth daily.    Marland Kitchen atorvastatin (LIPITOR) 80 MG tablet TAKE ONE TABLET BY MOUTH ONCE DAILY 90 tablet 1  . carvedilol (COREG) 12.5 MG tablet TAKE ONE TABLET BY MOUTH TWICE DAILY WITH  A  MEAL 60 tablet 11  . furosemide (LASIX) 40 MG tablet Take 3 tablets by mouth in the morning and take 3 tablets by mouth in the afternoon 180 tablet 2  . KLOR-CON M20 20 MEQ tablet TAKE ONE TABLET BY MOUTH TWICE DAILY 60 tablet 11  . lisinopril (PRINIVIL,ZESTRIL) 5 MG tablet TAKE ONE TABLET BY MOUTH ONCE DAILY 90 tablet 2  . spironolactone (ALDACTONE) 25 MG tablet TAKE ONE TABLET BY MOUTH ONCE DAILY 90 tablet 2   No current facility-administered medications for this visit.      Past Medical History:  Diagnosis Date  . Alcohol abuse    resolved  . Anxiety    . 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 Roxan Hockey)  . Cannabis abuse   . CHF (congestive heart failure) ([place])   . Chronic systolic heart failure ([place])   . Cocaine abuse    resolved  . COPD (chronic obstructive pulmonary disease) ([place]) 12/27/2008       . Fibroids    abd. CT 2/12: large fibroid uterus  . High cholesterol   . History of blood transfusion 1980's   "after tubal pregnancy"  . Hypertension   . ICD (implantable cardiac defibrillator) in place   . Mitral stenosis   . Nonischemic cardiomyopathy ([place])    a. cath 05/2011 showed a 70% D1, a 50% OM1, a 50-60% left circumflex and a 20% right coronary artery. Ejection fraction was 20%. There was mild MR and severe AI. There was mild pulmonary hypertension; b.echocardiogram 2/13 EF 20-25%, restrictive filling pattern, mod AS with mod  to severe AI. 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 mod to severe MR, mod LAE, mild RVE, mild RAE, mod TR  . Ovarian cyst    ovarian cystic mass  . Personal history of noncompliance with medical treatment, presenting hazards to health   . Pulmonary hypertension   . Pulmonary nodule, left    f/u chest CT 2/12: interval clearing of RUL air space nodule, borderline enlarged mediastinal lymph nodes stable, pul. arterial HTN  . Shortness of breath    "at rest" (01/04/2012)  . Ventricular tachycardia ([place])    LifeVest  Past Surgical History:  Procedure Laterality Date  . AORTIC VALVE REPLACEMENT  08/04/2011   Procedure: AORTIC VALVE REPLACEMENT (AVR);  Surgeon: Melrose Nakayama, MD;  Location: [facility];  Service: Open Heart Surgery;  Laterality: N/A;  WITH ROOT ENLARGEMENT  . CARDIAC CATHETERIZATION  2008; ~ 2011;  05/2011  . CARDIAC DEFIBRILLATOR PLACEMENT  01/04/2012   MDT Protecta XT VR ICD implanted by Dr Rayann Heman  . CARDIAC VALVE REPLACEMENT    . ECTOPIC PREGNANCY SURGERY  1980's  . IMPLANTABLE CARDIOVERTER DEFIBRILLATOR IMPLANT  N/A 01/04/2012   Procedure: IMPLANTABLE CARDIOVERTER DEFIBRILLATOR IMPLANT;  Surgeon: Thompson Grayer, MD;  Location: [facility] CATH LAB;  Service: Cardiovascular;  Laterality: N/A;  . LEFT AND RIGHT HEART CATHETERIZATION WITH CORONARY ANGIOGRAM N/A 06/07/2011   Procedure: LEFT AND RIGHT HEART CATHETERIZATION WITH CORONARY ANGIOGRAM;  Surgeon: Peter M Martinique, MD;  Location: [facility] CATH LAB;  Service: Cardiovascular;  Laterality: N/A;  . MITRAL VALVE REPAIR  08/04/2011   Procedure: MITRAL VALVE REPAIR (MVR);  Surgeon: Melrose Nakayama, MD;  Location: [facility];  Service: Open Heart Surgery;  Laterality: N/A;  . TEE WITHOUT CARDIOVERSION  06/08/2011   Procedure: TRANSESOPHAGEAL ECHOCARDIOGRAM (TEE);  Surgeon: Lelon Perla, MD;  Location: [facility] ENDOSCOPY;  Service: Cardiovascular;  Laterality: N/A;  . TONSILLECTOMY  ~ 1972    Social History   Social History  . Marital status: Single    Spouse name: N/A  . Number of children: N/A  . Years of education: N/A   Occupational History  . Not on file.   Social History Main Topics  . Smoking status: Former Smoker    Packs/day: 1.00    Years: 10.00    Types: Cigarettes    Quit date: 05/27/2010  . Smokeless tobacco: Never Used  . Alcohol use 8.4 oz/week    14 Shots of liquor per week     Comment: drinks 1-2 shots of tequila most days  . Drug use:     Types: Marijuana, Cocaine     Comment: occasional marijuana; no longer using cocaine  . Sexual activity: Yes   Other Topics Concern  . Not on file   Social History Narrative   Single no children   Tobacco Use - Hx of x-20 years   History Cocaine and Mariguana use    Family History  Problem Relation Age of Onset  . Cancer Brother   . Hypertension Mother   . Heart disease Mother   . COPD Mother   . Cholecystitis Mother   . Heart attack Mother   . Coronary artery disease    . Heart disease Maternal Grandmother   . Heart attack Maternal Grandmother   . Anesthesia problems Neg Hx     ROS: no fevers  or chills, productive cough, hemoptysis, dysphasia, odynophagia, melena, hematochezia, dysuria, hematuria, rash, seizure activity, orthopnea, PND, pedal edema, claudication. Remaining systems are negative.  Physical Exam: [name] well-nourished in no acute distress.  Skin is warm and dry.  HEENT is normal.  Neck is supple.  Chest is clear to auscultation with normal expansion.  Cardiovascular exam is regular rate and rhythm. 2/6 systolic murmur left sternal border. Abdominal exam nontender or distended. No masses palpated. Extremities show no edema. neuro grossly intact  ECG sinus rhythm at a rate of 57. Occasional PVC. Inferior lateral T-wave inversion.  A/P  1 . Chronic combined systolic and diastolic heart failure-patient is euvolemic on examination. Continue present dose of Lasix. Check BNP, potassium and renal function. Patient also  instructed to weigh daily and take an additional 40 mg of Lasix for weight gain of 2-3 pounds. We discussed low-sodium diet and she has requested a nutritionist. We also discussed limiting fluid intake to 2 L per day.   2 ICD-management per electrophysiology.  3 coronary artery disease-continue aspirin and statin.  4 hypertension-blood pressure controlled. Continue present medications.  5 hyperlipidemia-continue statin.   6 nonischemic cardiomyopathy-continue ACE inhibitor and beta blocker. If renal function stable I will change patient from ACE inhibitor to entresto.  7 history of aortic valve replacement-continue SBE prophylaxis.   8 status post mitral valve repair-continue SBE prophylaxis.   Kirk Ruths, MD

## 2016-02-05 ENCOUNTER — Ambulatory Visit (INDEPENDENT_AMBULATORY_CARE_PROVIDER_SITE_OTHER): Payer: Medicaid Other

## 2016-02-05 DIAGNOSIS — Z95810 Presence of automatic (implantable) cardiac defibrillator: Secondary | ICD-10-CM

## 2016-02-05 DIAGNOSIS — I5022 Chronic systolic (congestive) heart failure: Secondary | ICD-10-CM

## 2016-02-05 NOTE — Progress Notes (Signed)
EPIC Encounter for ICM Monitoring  Patient Name: [name] is a 55 y.o. female Date: 02/05/2016 Primary Care Physican: [name], MD Primary Cardiologist: Stanford Breed Electrophysiologist: Allred Dry Weight:    unknown       Heart Failure questions reviewed, pt asymptomatic   Thoracic impedance abnormal suggesting fluid accumulation.  Labs: 07/21/2015 Creatinine 1.42, BUN 33, Potassium 4.7, Sodium 136 01/09/2015 Creatinine 1.26, BUN 33, Potassium 4.3, Sodium 142 Since 04/03/2014 Creatinine has ranged 1.16 to 1.42    Recommendations:  Patient has office appointment with Dr Stanford Breed on Monday, 10/23 and she confirmed the appointment.   Advised will send copy of ICM note for his review for the appointment.  If Dr Stanford Breed has any recommendations before the appointment, will call her back.    Discussed importance of limiting salt intake and patient eats out frequently making it difficult to limit to 2000 mg daily.  Encouraged to call for fluid symptoms.    Follow-up plan: ICM clinic phone appointment on 02/18/2016 to recheck fluid levels.  Copy of ICM check sent to primary cardiologist and device physician.   ICM trend: 02/05/2016       [name], RN 02/05/2016 11:45 AM

## 2016-02-08 ENCOUNTER — Other Ambulatory Visit: Payer: Self-pay | Admitting: Cardiology

## 2016-02-09 ENCOUNTER — Encounter: Payer: Self-pay | Admitting: Cardiology

## 2016-02-09 ENCOUNTER — Ambulatory Visit (INDEPENDENT_AMBULATORY_CARE_PROVIDER_SITE_OTHER): Payer: Medicaid Other | Admitting: Cardiology

## 2016-02-09 VITALS — BP 110/60 | HR 57 | Ht 65.5 in | Wt 187.4 lb

## 2016-02-09 DIAGNOSIS — I519 Heart disease, unspecified: Secondary | ICD-10-CM

## 2016-02-09 DIAGNOSIS — Z952 Presence of prosthetic heart valve: Secondary | ICD-10-CM

## 2016-02-09 DIAGNOSIS — I5022 Chronic systolic (congestive) heart failure: Secondary | ICD-10-CM

## 2016-02-09 DIAGNOSIS — I428 Other cardiomyopathies: Secondary | ICD-10-CM

## 2016-02-09 NOTE — Telephone Encounter (Signed)
Rx has been sent to the pharmacy electronically. ° °

## 2016-02-09 NOTE — Patient Instructions (Addendum)
Medication Instructions:   NO CHANGE  Labwork:  Your physician recommends that you return for lab [place]:  Your physician wants you to follow-up in: [place] will receive a reminder letter in the mail two months in advance. If you don't receive a letter, please call our office to schedule the follow-up appointment.   AMB REFERRAL TO NUTRITION FOR HELP WITH LOW SODIUM DIET

## 2016-02-11 ENCOUNTER — Encounter: Payer: Self-pay | Admitting: Nurse Practitioner

## 2016-02-12 ENCOUNTER — Ambulatory Visit: Payer: Medicaid Other | Admitting: Dietician

## 2016-02-18 ENCOUNTER — Ambulatory Visit (INDEPENDENT_AMBULATORY_CARE_PROVIDER_SITE_OTHER): Payer: Medicaid Other

## 2016-02-18 ENCOUNTER — Telehealth: Payer: Self-pay

## 2016-02-18 DIAGNOSIS — I5022 Chronic systolic (congestive) heart failure: Secondary | ICD-10-CM

## 2016-02-18 DIAGNOSIS — Z95810 Presence of automatic (implantable) cardiac defibrillator: Secondary | ICD-10-CM

## 2016-02-18 NOTE — Telephone Encounter (Signed)
Remote ICM transmission received.  Attempted patient call and left detailed message regarding transmission and next ICM scheduled for 03/18/2016.  Advised to return call for any fluid symptoms or questions.

## 2016-02-18 NOTE — Progress Notes (Signed)
EPIC Encounter for ICM Monitoring  Patient Name: [name] is a 55 y.o. female Date: 02/18/2016 Primary Care Physican: [name], MD Primary Cardiologist:Crenshaw Electrophysiologist: Allred Dry Weight:unknown                                                   Attempted ICM call and unable to reach. Left detailed message regarding transmission.  Transmission reviewed.   Thoracic impedance normal.  Per last office visit with Dr Stanford Breed on 02/09/2016, take additional 40 mg of Lasix for weight gain of 2-3 pounds  Labs:  Patient instructed to get labs after last office visit on 10/23.   07/21/2015 Creatinine 1.42, BUN 33, Potassium 4.7, Sodium 136 01/09/2015 Creatinine 1.26, BUN 33, Potassium 4.3, Sodium 142 Since 04/03/2014 Creatinine has ranged 1.16 to 1.42   Follow-up plan: ICM clinic phone appointment on 03/18/2016.  Copy of ICM check sent to device physician.   ICM trend: 02/18/2016       Samantha Billings, RN 02/18/2016 1:37 PM

## 2016-02-20 ENCOUNTER — Encounter: Payer: Self-pay | Admitting: Internal Medicine

## 2016-02-24 LAB — BASIC METABOLIC PANEL
BUN: 33 mg/dL — ABNORMAL HIGH (ref 7–25)
CALCIUM: 9.1 mg/dL (ref 8.6–10.4)
CO2: 26 mmol/L (ref 20–31)
Chloride: 103 mmol/L (ref 98–110)
Creat: 1.54 mg/dL — ABNORMAL HIGH (ref 0.50–1.05)
Glucose, Bld: 136 mg/dL — ABNORMAL HIGH (ref 65–99)
Potassium: 4.5 mmol/L (ref 3.5–5.3)
SODIUM: 139 mmol/L (ref 135–146)

## 2016-02-24 LAB — BRAIN NATRIURETIC PEPTIDE: BRAIN NATRIURETIC PEPTIDE: 311.6 pg/mL — AB (ref <100)

## 2016-02-25 ENCOUNTER — Telehealth: Payer: Self-pay | Admitting: *Deleted

## 2016-02-25 DIAGNOSIS — Z79899 Other long term (current) drug therapy: Secondary | ICD-10-CM

## 2016-02-25 NOTE — Telephone Encounter (Signed)
Pt is returning your call

## 2016-02-25 NOTE — Telephone Encounter (Signed)
-----   Message from Lelon Perla, MD sent at 02/24/2016 12:54 PM EST ----- [name]

## 2016-02-25 NOTE — Telephone Encounter (Signed)
Per dr Stanford Breed patient to stop lisinopril x 2 days and then start entresto 24-26 mg bid. She will need a bmp in one week. Left message for pt to call

## 2016-02-26 ENCOUNTER — Telehealth: Payer: Self-pay | Admitting: Cardiology

## 2016-02-26 MED ORDER — SACUBITRIL-VALSARTAN 24-26 MG PO TABS: 1.0000 | ORAL_TABLET | Freq: Two times a day (BID) | ORAL | 11 refills | Status: DC

## 2016-02-26 NOTE — Telephone Encounter (Signed)
New message  Pt is returning Fredia Beets call

## 2016-02-26 NOTE — Telephone Encounter (Signed)
See other message, Pt notified she will stop Lisinopril then start Entresto Sunday then go to the lab on 03-08-16 for requested lab draw.

## 2016-02-26 NOTE — Telephone Encounter (Signed)
Pt notified she will stop Lisinopril then start Entresto Sunday then go to the lab on 03-08-16 for requested lab draw.

## 2016-02-27 ENCOUNTER — Ambulatory Visit: Payer: Medicaid Other | Admitting: Dietician

## 2016-02-27 LAB — CUP PACEART REMOTE DEVICE CHECK
Battery Voltage: 3.08 V
Brady Statistic RV Percent Paced: 2.58 %
Date Time Interrogation Session: 20171011165148
HighPow Impedance: 399 Ohm
HighPow Impedance: 67 Ohm
Implantable Lead Location: 753860
Implantable Lead Model: 181
Lead Channel Impedance Value: 399 Ohm
Lead Channel Setting Pacing Amplitude: 2.5 V
Lead Channel Setting Pacing Pulse Width: 0.4 ms
Lead Channel Setting Sensing Sensitivity: 0.3 mV
MDC IDC LEAD IMPLANT DT: 20130917
MDC IDC LEAD SERIAL: 323765
MDC IDC MSMT LEADCHNL RV PACING THRESHOLD AMPLITUDE: 0.875 V
MDC IDC MSMT LEADCHNL RV PACING THRESHOLD PULSEWIDTH: 0.4 ms
MDC IDC MSMT LEADCHNL RV SENSING INTR AMPL: 16.125 mV
MDC IDC MSMT LEADCHNL RV SENSING INTR AMPL: 16.125 mV
MDC IDC PG IMPLANT DT: 20130917

## 2016-02-27 NOTE — Telephone Encounter (Signed)
Savings card placed in the mail to the patient.

## 2016-03-04 ENCOUNTER — Other Ambulatory Visit: Payer: Self-pay | Admitting: Cardiology

## 2016-03-05 NOTE — Telephone Encounter (Signed)
Rx(s) sent to pharmacy electronically.  

## 2016-03-08 DIAGNOSIS — Z79899 Other long term (current) drug therapy: Secondary | ICD-10-CM | POA: Insufficient documentation

## 2016-03-08 LAB — BASIC METABOLIC PANEL
BUN: 19 mg/dL (ref 7–25)
CALCIUM: 9.4 mg/dL (ref 8.6–10.4)
CO2: 25 mmol/L (ref 20–31)
CREATININE: 1.38 mg/dL — AB (ref 0.50–1.05)
Chloride: 103 mmol/L (ref 98–110)
GLUCOSE: 108 mg/dL — AB (ref 65–99)
Potassium: 4.4 mmol/L (ref 3.5–5.3)
Sodium: 139 mmol/L (ref 135–146)

## 2016-03-08 NOTE — Telephone Encounter (Signed)
Pt in the lab-BMPorder entered as per telephone message dated 02-25-16

## 2016-03-08 NOTE — Addendum Note (Signed)
Addended by: Waylan Rocher on: 03/08/2016 12:05 PM   Modules accepted: Orders

## 2016-03-09 ENCOUNTER — Other Ambulatory Visit: Payer: Self-pay | Admitting: Cardiology

## 2016-03-18 ENCOUNTER — Ambulatory Visit (INDEPENDENT_AMBULATORY_CARE_PROVIDER_SITE_OTHER): Payer: Medicaid Other

## 2016-03-18 DIAGNOSIS — Z95810 Presence of automatic (implantable) cardiac defibrillator: Secondary | ICD-10-CM | POA: Diagnosis not present

## 2016-03-18 DIAGNOSIS — I5022 Chronic systolic (congestive) heart failure: Secondary | ICD-10-CM | POA: Diagnosis not present

## 2016-03-19 NOTE — Progress Notes (Signed)
EPIC Encounter for ICM Monitoring  Patient Name: [name] is a 55 y.o. female Date: 03/19/2016 Primary Care Physican: [name], MD Primary Cardiologist:Crenshaw Electrophysiologist: Allred Dry Weight:unknown      Heart Failure questions reviewed, pt symptomatic with swelling in belly and feeling bloated  Thoracic impedance abnormal suggesting fluid accumulation.  She stated the fluid is from eating salty holiday foods.   Labs: 03/08/2016 Creatinine 1.38, BUN 19, Potassium 4.4, Sodium 139 02/23/2016 Creatinine 1.54, BUN 33, Potassium 4.5, Sodium 139  07/21/2015 Creatinine 1.42, BUN 33, Potassium 4.7, Sodium 136  01/09/2015 Creatinine 1.26, BUN 33, Potassium 4.5, Sodium 136   Recommendations:  Take 1 additional tablet of Furosemide 40 mg a day x 3 days and 1 additional tablet of Potassium 20 meq a day x 3 days.  Return to prior dosages after 3 days.  She verbalized understanding. Reinforced low salt food choices and limiting fluid intake to < 2 liters per day.     Follow-up plan: ICM clinic phone appointment on 03/25/2016 to recheck fluid levels.  Copy of ICM check sent to primary cardiologist and device physician.   ICM trend: 03/18/2016       [name], RN 03/19/2016 7:37 AM

## 2016-03-25 ENCOUNTER — Ambulatory Visit (INDEPENDENT_AMBULATORY_CARE_PROVIDER_SITE_OTHER): Payer: Medicaid Other

## 2016-03-25 ENCOUNTER — Telehealth: Payer: Self-pay | Admitting: Cardiology

## 2016-03-25 DIAGNOSIS — Z95810 Presence of automatic (implantable) cardiac defibrillator: Secondary | ICD-10-CM

## 2016-03-25 DIAGNOSIS — I5022 Chronic systolic (congestive) heart failure: Secondary | ICD-10-CM

## 2016-03-25 NOTE — Progress Notes (Signed)
EPIC Encounter for ICM Monitoring  Patient Name: [name] is a 55 y.o. female Date: 03/25/2016 Primary Care Physican: [name], MD Primary Cardiologist:Crenshaw Electrophysiologist: Allred Dry Weight:  unknown       Heart Failure questions reviewed, pt asymptomatic for fluid symptoms but does have a cold  Thoracic impedance close to normal after taking increase of Furosemide and Potassium x 3 days on 12/1.  Labs: 03/08/2016 Creatinine 1.38, BUN 19, Potassium 4.4, Sodium 139 02/23/2016 Creatinine 1.54, BUN 33, Potassium 4.5, Sodium 139  07/21/2015 Creatinine 1.42, BUN 33, Potassium 4.7, Sodium 136  01/09/2015 Creatinine 1.26, BUN 33, Potassium 4.5, Sodium 136   Recommendations: No changes.  Reinforced to limit low salt food choices to 2000 mg day and limiting fluid intake to < 2 liters per day. Encouraged to call for fluid symptoms.    Follow-up plan: ICM clinic phone appointment on 04/24/2015.  Copy of ICM check sent to primary cardiologist and device physician to show updated fluid level check.   ICM trend: 03/25/2016       [name], RN 03/25/2016 3:02 PM

## 2016-03-25 NOTE — Telephone Encounter (Signed)
LMOVM reminding pt to send remote transmission.   

## 2016-03-28 ENCOUNTER — Other Ambulatory Visit: Payer: Self-pay | Admitting: Internal Medicine

## 2016-03-28 DIAGNOSIS — I5022 Chronic systolic (congestive) heart failure: Secondary | ICD-10-CM

## 2016-03-29 ENCOUNTER — Other Ambulatory Visit: Payer: Self-pay | Admitting: Internal Medicine

## 2016-03-29 ENCOUNTER — Encounter: Payer: Medicaid Other | Admitting: Nurse Practitioner

## 2016-03-29 DIAGNOSIS — I5022 Chronic systolic (congestive) heart failure: Secondary | ICD-10-CM

## 2016-04-23 ENCOUNTER — Ambulatory Visit (INDEPENDENT_AMBULATORY_CARE_PROVIDER_SITE_OTHER): Payer: Medicaid Other

## 2016-04-23 ENCOUNTER — Telehealth: Payer: Self-pay

## 2016-04-23 DIAGNOSIS — I5022 Chronic systolic (congestive) heart failure: Secondary | ICD-10-CM

## 2016-04-23 DIAGNOSIS — Z95810 Presence of automatic (implantable) cardiac defibrillator: Secondary | ICD-10-CM

## 2016-04-23 NOTE — Progress Notes (Signed)
EPIC Encounter for ICM Monitoring  Patient Name: [name] is a 56 y.o. female Date: 04/23/2016 Primary Care Physican: [name], MD Primary Cardiologist:Crenshaw Electrophysiologist: Allred Dry Weight:     unknown   Attempted ICM call and unable to reach.  Left message to return call regarding transmission.  Transmission reviewed.   Thoracic impedance abnormal suggesting fluid accumulation.  Labs: 03/08/2016 Creatinine 1.38, BUN 19, Potassium 4.4, Sodium 139 02/23/2016 Creatinine 1.54, BUN 33, Potassium 4.5, Sodium 139  07/21/2015 Creatinine 1.42, BUN 33, Potassium 4.7, Sodium 136  09/22/2016Creatinine 1.26, BUN 33, Potassium 4.5, Sodium 136   Recommendations: None - unable to reach  Follow-up plan: ICM clinic phone appointment on 04/30/2016 to recheck fluid levels.  Copy of ICM check sent to primary cardiologist and device physician.   3 month ICM trend : 04/23/2016   1 Year ICM trend:      Samantha Billings, RN 04/23/2016 5:10 PM

## 2016-04-23 NOTE — Telephone Encounter (Signed)
Remote ICM transmission received.  Attempted patient call and left message to return call.   

## 2016-04-25 ENCOUNTER — Encounter (HOSPITAL_COMMUNITY): Payer: Self-pay | Admitting: Emergency Medicine

## 2016-04-25 ENCOUNTER — Emergency Department (HOSPITAL_COMMUNITY)
Admission: EM | Admit: 2016-04-25 | Discharge: 2016-04-25 | Disposition: A | Discharge status: Home / Self Care | Payer: Medicaid Other | Attending: Emergency Medicine | Admitting: Emergency Medicine

## 2016-04-25 DIAGNOSIS — M779 Enthesopathy, unspecified: Secondary | ICD-10-CM | POA: Insufficient documentation

## 2016-04-25 DIAGNOSIS — I110 Hypertensive heart disease with heart failure: Secondary | ICD-10-CM | POA: Insufficient documentation

## 2016-04-25 DIAGNOSIS — M25571 Pain in right ankle and joints of right foot: Secondary | ICD-10-CM | POA: Diagnosis present

## 2016-04-25 DIAGNOSIS — J449 Chronic obstructive pulmonary disease, unspecified: Secondary | ICD-10-CM | POA: Diagnosis not present

## 2016-04-25 DIAGNOSIS — Z87891 Personal history of nicotine dependence: Secondary | ICD-10-CM | POA: Insufficient documentation

## 2016-04-25 DIAGNOSIS — I5043 Acute on chronic combined systolic (congestive) and diastolic (congestive) heart failure: Secondary | ICD-10-CM | POA: Insufficient documentation

## 2016-04-25 DIAGNOSIS — Z95810 Presence of automatic (implantable) cardiac defibrillator: Secondary | ICD-10-CM | POA: Insufficient documentation

## 2016-04-25 DIAGNOSIS — Z7982 Long term (current) use of aspirin: Secondary | ICD-10-CM | POA: Diagnosis not present

## 2016-04-25 DIAGNOSIS — M7751 Other enthesopathy of right foot: Secondary | ICD-10-CM

## 2016-04-25 MED ORDER — LIDOCAINE 5 % EX OINT
1.0000 | TOPICAL_OINTMENT | Freq: Two times a day (BID) | CUTANEOUS | 0 refills | Status: DC | PRN
Start: 2016-04-25 — End: 2016-09-06

## 2016-04-25 MED ORDER — IBUPROFEN 600 MG PO TABS: 600.0000 mg | ORAL_TABLET | Freq: Three times a day (TID) | ORAL | 0 refills | Status: DC

## 2016-04-25 MED ORDER — KETOROLAC TROMETHAMINE 60 MG/2ML IM SOLN
60.0000 mg | Freq: Once | INTRAMUSCULAR | Status: AC
  Administered 2016-04-25: 60 mg via INTRAMUSCULAR
  Filled 2016-04-25: qty 2

## 2016-04-25 NOTE — ED Notes (Signed)
ED Provider at bedside. 

## 2016-04-25 NOTE — ED Provider Notes (Signed)
[facility] DEPT Provider Note   CSN: [id number] Arrival date & time: 04/25/16  1436  By signing my name below, I, [name], attest that this documentation has been prepared under the direction and in the presence of Julianne Rice, MD. Electronically Signed: Gwenlyn Bell, ED Scribe. 04/25/16. 3:23 PM.  History   Chief Complaint Chief Complaint  Patient presents with  . Foot Pain   The history is provided by the patient. No language interpreter was used.   HPI Comments: [name] is a 56 y.o. female who presents to the Emergency Department complaining of gradual onset, progressively worsening, constant, moderate right ankle/foot pain for 3 days. Pt states she wore new shoes 3 days ago and started to experience pain while wearing shoes. Patient is exacerbated with walking and palpation. Was seen in emergency department 3 years ago for similar symptoms. Placed on anti-inflammatory medication with resolution of symptoms. No known trauma. No fever or chills. Past Medical History:  Diagnosis Date  . Alcohol abuse    resolved  . Anxiety   . 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 Roxan Hockey)  . Cannabis abuse   . CHF (congestive heart failure) ([place])   . Chronic systolic heart failure ([place])   . Cocaine abuse    resolved  . COPD (chronic obstructive pulmonary disease) ([place]) 12/27/2008       . Fibroids    abd. CT 2/12: large fibroid uterus  . High cholesterol   . History of blood transfusion 1980's   "after tubal pregnancy"  . Hypertension   . ICD (implantable cardiac defibrillator) in place   . Mitral stenosis   . Nonischemic cardiomyopathy ([place])    a. cath 05/2011 showed a 70% D1, a 50% OM1, a 50-60% left circumflex and a 20% right coronary artery. Ejection fraction was 20%. There was mild MR and severe AI. There was mild pulmonary hypertension; b.echocardiogram 2/13 EF 20-25%, restrictive filling  pattern, mod AS with mod  to severe AI. 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 mod to severe MR, mod LAE, mild RVE, mild RAE, mod TR  . Ovarian cyst    ovarian cystic mass  . Personal history of noncompliance with medical treatment, presenting hazards to health   . Pulmonary hypertension   . Pulmonary nodule, left    f/u chest CT 2/12: interval clearing of RUL air space nodule, borderline enlarged mediastinal lymph nodes stable, pul. arterial HTN  . Shortness of breath    "at rest" (01/04/2012)  . Ventricular tachycardia [facility])    LifeVest    Patient Active Problem List   Diagnosis Date Noted  . Medication management 03/08/2016  . Hyperlipidemia 07/08/2014  . Acute on chronic combined systolic and diastolic heart failure ([place]) 04/22/2014  . Dyspnea 04/03/2014  . Insomnia 10/01/2013  . Healthcare maintenance 10/01/2013  . Chronic systolic dysfunction of left ventricle 03/14/2013  . Automatic implantable cardioverter-defibrillator in situ 01/05/2012  . S/P aortic valve replacement 08/12/2011  . S/P mitral valve repair 08/12/2011  . Aortic root enlargement ([place]) 08/12/2011  . Fluid overload 08/06/2011  . Aortic insufficiency 06/01/2011  . Syncope 05/28/2011  . Aortic stenosis 05/28/2011  . Valvular heart disease 08/03/2010  . ABDOMINAL MASS 05/26/2010  . Essential hypertension 12/27/2008  . Coronary atherosclerosis of native coronary artery 12/27/2008  . Nonischemic cardiomyopathy ([place]) 12/27/2008  . COPD (chronic obstructive pulmonary disease) ([place]) 12/27/2008  . ORTHOPNEA 12/27/2008  . DYSPNEA 12/27/2008  Past Surgical History:  Procedure Laterality Date  . AORTIC VALVE REPLACEMENT  08/04/2011   Procedure: AORTIC VALVE REPLACEMENT (AVR);  Surgeon: Melrose Nakayama, MD;  Location: [facility];  Service: Open Heart Surgery;  Laterality: N/A;  WITH ROOT ENLARGEMENT  . CARDIAC CATHETERIZATION  2008; ~ 2011;  05/2011  . CARDIAC DEFIBRILLATOR PLACEMENT  01/04/2012   MDT Protecta XT VR ICD  implanted by Dr Rayann Heman  . CARDIAC VALVE REPLACEMENT    . ECTOPIC PREGNANCY SURGERY  1980's  . IMPLANTABLE CARDIOVERTER DEFIBRILLATOR IMPLANT N/A 01/04/2012   Procedure: IMPLANTABLE CARDIOVERTER DEFIBRILLATOR IMPLANT;  Surgeon: Thompson Grayer, MD;  Location: [facility] CATH LAB;  Service: Cardiovascular;  Laterality: N/A;  . LEFT AND RIGHT HEART CATHETERIZATION WITH CORONARY ANGIOGRAM N/A 06/07/2011   Procedure: LEFT AND RIGHT HEART CATHETERIZATION WITH CORONARY ANGIOGRAM;  Surgeon: Peter M Martinique, MD;  Location: [facility] CATH LAB;  Service: Cardiovascular;  Laterality: N/A;  . MITRAL VALVE REPAIR  08/04/2011   Procedure: MITRAL VALVE REPAIR (MVR);  Surgeon: Melrose Nakayama, MD;  Location: [facility];  Service: Open Heart Surgery;  Laterality: N/A;  . TEE WITHOUT CARDIOVERSION  06/08/2011   Procedure: TRANSESOPHAGEAL ECHOCARDIOGRAM (TEE);  Surgeon: Lelon Perla, MD;  Location: [facility] ENDOSCOPY;  Service: Cardiovascular;  Laterality: N/A;  . TONSILLECTOMY  ~ 1972    OB History    No data available       Home Medications    Prior to Admission medications   Medication Sig Start Date End Date Taking? Authorizing Provider  aspirin EC 325 MG tablet Take 325 mg by mouth daily.    Historical Provider, MD  atorvastatin (LIPITOR) 80 MG tablet TAKE ONE TABLET BY MOUTH ONCE DAILY 02/09/16   Lelon Perla, MD  carvedilol (COREG) 12.5 MG tablet TAKE ONE TABLET BY MOUTH TWICE DAILY WITH  A  MEAL 07/30/15   Lelon Perla, MD  furosemide (LASIX) 40 MG tablet TAKE 3 TABLETS BY MOUTH IN THE MORNING AND TAKE 3 TABLETS BY MOUTH IN THE AFTERNOON 03/29/16   Thompson Grayer, MD  ibuprofen (ADVIL,MOTRIN) 600 MG tablet Take 1 tablet (600 mg total) by mouth 3 (three) times daily after meals. 04/25/16   Julianne Rice, MD  lidocaine (XYLOCAINE) 5 % ointment Apply 1 application topically 2 (two) times daily as needed. 04/25/16   Julianne Rice, MD  potassium chloride SA (KLOR-CON M20) 20 MEQ tablet Take 1 tablet (20 mEq total) by mouth  2 (two) times daily. 03/05/16   Lelon Perla, MD  sacubitril-valsartan (ENTRESTO) 24-26 MG Take 1 tablet by mouth 2 (two) times daily. 02/26/16   Lelon Perla, MD  spironolactone (ALDACTONE) 25 MG tablet TAKE ONE TABLET BY MOUTH ONCE DAILY 03/09/16   Lelon Perla, MD    Family History Family History  Problem Relation Age of Onset  . Cancer Brother   . Hypertension Mother   . Heart disease Mother   . COPD Mother   . Cholecystitis Mother   . Heart attack Mother   . Coronary artery disease    . Heart disease Maternal Grandmother   . Heart attack Maternal Grandmother   . Anesthesia problems Neg Hx     Social History Social History  Substance Use Topics  . Smoking status: Former Smoker    Packs/day: 1.00    Years: 10.00    Types: Cigarettes    Quit date: 05/27/2010  . Smokeless tobacco: Never Used  . Alcohol use 8.4 oz/week    14 Shots of  liquor per week     Comment: drinks 1-2 shots of tequila most days     Allergies   Penicillins   Review of Systems Review of Systems  Constitutional: Negative for chills and fever.  Respiratory: Negative for cough and shortness of breath.   Cardiovascular: Negative for chest pain, palpitations and leg swelling.  Musculoskeletal: Positive for arthralgias. Negative for back pain, joint swelling and myalgias.  Skin: Negative for rash and wound.  Neurological: Negative for weakness and numbness.  All other systems reviewed and are negative.  Physical Exam Updated Vital Signs BP 120/76   Pulse 61   Temp 97.9 F (36.6 C) (Oral)   Resp 16   LMP 08/04/2011   SpO2 100%   Physical Exam  Constitutional: She is oriented to person, place, and time. She appears well-developed and well-nourished.  HENT:  Head: Normocephalic and atraumatic.  Eyes: EOM are normal. Pupils are equal, round, and reactive to light.  Neck: Normal range of motion. Neck supple. No JVD present.  Cardiovascular: Normal rate.   Pulmonary/Chest: Effort  normal.  Abdominal: Soft.  Musculoskeletal: Normal range of motion. She exhibits edema and tenderness.  Patient has tenderness to palpation at the insertion site of the Achilles tendon on the right. There is no erythema, warmth or obvious injury. Patient has mild diffuse edema to the dorsum of the right foot. She has full range of motion of the right ankle. Dorsalis pedis and posterior tibial pulses 2+.  Neurological: She is alert and oriented to person, place, and time.  Sensation intact. 5/5 motor in all extremity. Ambulating without assistance.  Skin: Skin is warm and dry. Capillary refill takes less than 2 seconds. No rash noted. No erythema.  Psychiatric: She has a normal mood and affect. Her behavior is normal.  Nursing note and vitals reviewed.    ED Treatments / Results  DIAGNOSTIC STUDIES: Oxygen Saturation is 100% on RA, normal by my interpretation.    COORDINATION OF CARE: 3:12 PM Discussed treatment plan with pt at bedside which includes Toradol and pt agreed to plan.  Labs (all labs ordered are listed, but only abnormal results are displayed) Labs Reviewed - No data to display  EKG  EKG Interpretation None       Radiology No results found.  Procedures Procedures (including critical care time)  Medications Ordered in ED Medications  ketorolac (TORADOL) injection 60 mg (60 mg Intramuscular Given 04/25/16 1532)     Initial Impression / Assessment and Plan / ED Course  I have reviewed the triage vital signs and the nursing notes.  Pertinent labs & imaging results that were available during my care of the patient were reviewed by me and considered in my medical decision making (see chart for details).  Clinical Course    No obvious injury present. Do not believe that imaging studies are necessary at this time. Patient without evidence of infection. Likely tendinitis of the Achilles tendon. Will start on NSAIDs and advised RICE therapy and follow-up with  orthopedics should symptoms persist. Return precautions given.  Final Clinical Impressions(s) / ED Diagnoses   Final diagnoses:  Tendonitis of ankle, right    New Prescriptions New Prescriptions   IBUPROFEN (ADVIL,MOTRIN) 600 MG TABLET    Take 1 tablet (600 mg total) by mouth 3 (three) times daily after meals.   LIDOCAINE (XYLOCAINE) 5 % OINTMENT    Apply 1 application topically 2 (two) times daily as needed.   I personally performed the services described in  this documentation, which was scribed in my presence. The recorded information has been reviewed and is accurate.      Julianne Rice, MD 04/25/16 [phone]

## 2016-04-25 NOTE — ED Triage Notes (Addendum)
Rt foot pain after wearing boots  W/o socks on  Thursday  Good pulses , states back of heel hurts states feels like same pain from pinched nerve in foot before,  foot is  Slightly swollen

## 2016-04-28 ENCOUNTER — Ambulatory Visit (INDEPENDENT_AMBULATORY_CARE_PROVIDER_SITE_OTHER): Payer: Medicaid Other | Admitting: Internal Medicine

## 2016-04-28 ENCOUNTER — Encounter: Payer: Self-pay | Admitting: Internal Medicine

## 2016-04-28 DIAGNOSIS — M25571 Pain in right ankle and joints of right foot: Secondary | ICD-10-CM | POA: Diagnosis present

## 2016-04-28 DIAGNOSIS — M79671 Pain in right foot: Secondary | ICD-10-CM | POA: Insufficient documentation

## 2016-04-28 MED ORDER — MELOXICAM 7.5 MG PO TABS: 7.5000 mg | ORAL_TABLET | Freq: Every day | ORAL | 0 refills | Status: DC

## 2016-04-28 NOTE — Progress Notes (Signed)
Subjective:    [name] - 56 y.o. female MRN [id number]  Date of birth: Sep 14, 1960  HPI  [name] is here for right ankle and foot pain.   Right Ankle/Foot Pain: Located at the back of the heel and the bottom of her foot.  Started approximately 6-7 days ago when she wore a new pair of [place] boots without socks. Reports she didn't wear socks and her feet were sliding around in the shoe a significant amount. She began to experience the pain while wearing the shoe and it has persisted since. She was seen in the ED on 1/7 and given a Toradol injection. Also given Rx for Ibuprofen 600 mg and Lidocaine gel which she has been using without relief. Pain is worsened with walking but is also very severe first thing in the morning upon getting out of bed. She initially had swelling to the area but that has since improved. She is ambulating but using a walker for support.   -  reports that she quit smoking about 5 years ago. Her smoking use included Cigarettes. She has a 10.00 pack-year smoking history. She has never used smokeless tobacco. - Review of Systems: Per HPI. - Past Medical History: Patient Active Problem List   Diagnosis Date Noted  . Right ankle pain 04/28/2016  . Right foot pain 04/28/2016  . Medication management 03/08/2016  . Hyperlipidemia 07/08/2014  . Acute on chronic combined systolic and diastolic heart failure ([place]) 04/22/2014  . Dyspnea 04/03/2014  . Insomnia 10/01/2013  . Healthcare maintenance 10/01/2013  . Chronic systolic dysfunction of left ventricle 03/14/2013  . Automatic implantable cardioverter-defibrillator in situ 01/05/2012  . S/P aortic valve replacement 08/12/2011  . S/P mitral valve repair 08/12/2011  . Aortic root enlargement ([place]) 08/12/2011  . Fluid overload 08/06/2011  . Aortic insufficiency 06/01/2011  . Syncope 05/28/2011  . Aortic stenosis 05/28/2011  . Valvular heart disease 08/03/2010  . ABDOMINAL MASS 05/26/2010  . Essential  hypertension 12/27/2008  . Coronary atherosclerosis of native coronary artery 12/27/2008  . Nonischemic cardiomyopathy ([place]) 12/27/2008  . COPD (chronic obstructive pulmonary disease) ([place]) 12/27/2008  . ORTHOPNEA 12/27/2008  . DYSPNEA 12/27/2008   - Medications: reviewed and updated   Objective:   Physical Exam BP 110/60   Pulse (!) 55   Temp 97.4 F (36.3 C) (Oral)   Ht 5' 5.5" (1.664 m)   Wt 191 lb (86.6 kg)   LMP 08/04/2011   SpO2 98%   BMI 31.30 kg/m  Gen: NAD, alert, cooperative with exam, well-appearing Right Ankle/Foot: No erythema, increased warmth, or obvious edema. TTP over the plantar fascia. TTP at the insertion of the achilles tendon to the heel. No TTP over the lateral or medial malleolus. Full ROM of right ankle. Strength of right ankle intact. Negative Thompson test. 2+ pedal pulses bilaterally. Ambulates with a limp but bearing weight on the right side.     Assessment & Plan:   Right ankle pain Suspect related to an achilles tendonitis from overuse in non-supportive shoes. No edema appreciated over the tendon on exam today and doubt full rupture of tendon given negative thompson test.  -will try Mobic in place of Ibuprofen -recommended OTC Biofreeze gel as patient has had success with that in the past and does not feel lidocaine is working  -discussed importance of frequent icing -continue to wrap ankle for stability -discussed gentle ROM ankle exercises such as spelling out the alphabet  -patient to schedule an appointment at [facility] in  case pain not improving for further evaluation and imaging   Right foot pain Right foot significantly tender over the plantar fascia and pain worsened first thing in the morning about standing/ambulation. Suspect related to wearing non-supportive shoes. Of note, patient presented to appointment today wearing the offending boot on her left foot and a bedroom slipper on her right foot.  -recommended wearing supportive shoes and  considering an OTC cushioned insole  -patient to try rolling foot over a frozen water bottle several times per day and gentle stretching of the fascia  -Rx for Mobic given  -f/u with [facility] as needed     Phill Myron, D.O. 04/28/2016, 11:27 AM PGY-2, [place]

## 2016-04-28 NOTE — Patient Instructions (Addendum)
Call the [facility] at [phone] to schedule an appointment for next week. If you are feeling better you do not need to keep this appointment.    Achilles Tendinitis Achilles tendinitis is inflammation of the tough, cord-like band that attaches the lower muscles of your leg to your heel (Achilles tendon). It is usually caused by overusing the tendon and joint involved.  CAUSES Achilles tendinitis can happen because of:  A sudden increase in exercise or activity (such as running).  Doing the same exercises or activities (such as jumping) over and over.  Not warming up calf muscles before exercising.  Exercising in shoes that are worn out or not made for exercise.  Having arthritis or a bone growth on the back of the heel bone. This can rub against the tendon and hurt the tendon. SIGNS AND SYMPTOMS The most common symptoms are:  Pain in the back of the leg, just above the heel. The pain usually gets worse with exercise and better with rest.  Stiffness or soreness in the back of the leg, especially in the morning.  Swelling of the skin over the Achilles tendon.  Trouble standing on tiptoe. Sometimes, an Achilles tendon tears (ruptures). Symptoms of an Achilles tendon rupture can include:  Sudden, severe pain in the back of the leg.  Trouble putting weight on the foot or walking normally. DIAGNOSIS Achilles tendinitis will be diagnosed based on symptoms and a physical examination. An X-ray may be done to check if another condition is causing your symptoms. An MRI may be ordered if your health care provider suspects you may have completely torn your tendon, which is called an Achilles tendon rupture.  TREATMENT  Achilles tendinitis usually gets better over time. It can take weeks to months to heal completely. Treatment focuses on treating the symptoms and helping the injury heal. HOME CARE INSTRUCTIONS   Rest your Achilles tendon and avoid activities that cause  pain.  Apply ice to the injured area:  Put ice in a plastic bag.  Place a towel between your skin and the bag.  Leave the ice on for 20 minutes, 2-3 times a day  Try to avoid using the tendon (other than gentle range of motion) while the tendon is painful. Do not resume use until instructed by your health care provider. Then begin use gradually. Do not increase use to the point of pain. If pain does develop, decrease use and continue the above measures. Gradually increase activities that do not cause discomfort until you achieve normal use.  Do exercises to make your calf muscles stronger and more flexible. Your health care provider or physical therapist can recommend exercises for you to do.  Wrap your ankle with an elastic bandage or other wrap. This can help keep your tendon from moving too much. Your health care provider will show you how to wrap your ankle correctly.  Only take over-the-counter or prescription medicines for pain, discomfort, or fever as directed by your health care provider. SEEK MEDICAL CARE IF:   Your pain and swelling increase or pain is uncontrolled with medicines.  You develop new, unexplained symptoms or your symptoms get worse.  You are unable to move your toes or foot.  You develop warmth and swelling in your foot.  You have an unexplained temperature. MAKE SURE YOU:   Understand these instructions.  Will watch your condition.  Will get help right away if you are not doing well or get worse. This information is not  intended to replace advice given to you by your health care provider. Make sure you discuss any questions you have with your health care provider. Document Released: 01/13/2005 Document Revised: 04/26/2014 Document Reviewed: 11/15/2012 Elsevier Interactive Patient Education  2017 [employer]   Plantar Fasciitis Plantar fasciitis is a painful foot condition that affects the heel. It occurs when the band of tissue that connects the  toes to the heel bone (plantar fascia) becomes irritated. This can happen after exercising too much or doing other repetitive activities (overuse injury). The pain from plantar fasciitis can range from mild irritation to severe pain that makes it difficult for you to walk or move. The pain is usually worse in the morning or after you have been sitting or lying down for a while. CAUSES This condition may be caused by:  Standing for long periods of time.  Wearing shoes that do not fit.  Doing high-impact activities, including running, aerobics, and ballet.  Being overweight.  Having an abnormal way of walking (gait).  Having tight calf muscles.  Having high arches in your feet.  Starting a new athletic activity. SYMPTOMS The main symptom of this condition is heel pain. Other symptoms include:  Pain that gets worse after activity or exercise.  Pain that is worse in the morning or after resting.  Pain that goes away after you walk for a few minutes. DIAGNOSIS This condition may be diagnosed based on your signs and symptoms. Your health care provider will also do a physical exam to check for:  A tender area on the bottom of your foot.  A high arch in your foot.  Pain when you move your foot.  Difficulty moving your foot. You may also need to have imaging studies to confirm the diagnosis. These can include:  X-rays.  Ultrasound.  MRI. TREATMENT  Treatment for plantar fasciitis depends on the severity of the condition. Your treatment may include:  Rest, ice, and over-the-counter pain medicines to manage your pain.  Exercises to stretch your calves and your plantar fascia.  A splint that holds your foot in a stretched, upward position while you sleep (night splint).  Physical therapy to relieve symptoms and prevent problems in the future.  Cortisone injections to relieve severe pain.  Extracorporeal shock wave therapy (ESWT) to stimulate damaged plantar fascia with  electrical impulses. It is often used as a last resort before surgery.  Surgery, if other treatments have not worked after 12 months. HOME CARE INSTRUCTIONS  Take medicines only as directed by your health care provider.  Avoid activities that cause pain.  Roll the bottom of your foot over a bag of ice or a bottle of cold water. Do this for 20 minutes, 3-4 times a day.  Perform simple stretches as directed by your health care provider.  Try wearing athletic shoes with air-sole or gel-sole cushions or soft shoe inserts.  Wear a night splint while sleeping, if directed by your health care provider.  Keep all follow-up appointments with your health care provider. PREVENTION   Do not perform exercises or activities that cause heel pain.  Consider finding low-impact activities if you continue to have problems.  Lose weight if you need to. The best way to prevent plantar fasciitis is to avoid the activities that aggravate your plantar fascia. SEEK MEDICAL CARE IF:  Your symptoms do not go away after treatment with home care measures.  Your pain gets worse.  Your pain affects your ability to move or do your  daily activities. This information is not intended to replace advice given to you by your health care provider. Make sure you discuss any questions you have with your health care provider. Document Released: 12/29/2000 Document Revised: 07/28/2015 Document Reviewed: 02/13/2014 Elsevier Interactive Patient Education  2017 [employer].

## 2016-04-28 NOTE — Assessment & Plan Note (Signed)
Suspect related to an achilles tendonitis from overuse in non-supportive shoes. No edema appreciated over the tendon on exam today and doubt full rupture of tendon given negative thompson test.  -will try Mobic in place of Ibuprofen -recommended OTC Biofreeze gel as patient has had success with that in the past and does not feel lidocaine is working  -discussed importance of frequent icing -continue to wrap ankle for stability -discussed gentle ROM ankle exercises such as spelling out the alphabet  -patient to schedule an appointment at [facility] in case pain not improving for further evaluation and imaging

## 2016-04-28 NOTE — Assessment & Plan Note (Signed)
Right foot significantly tender over the plantar fascia and pain worsened first thing in the morning about standing/ambulation. Suspect related to wearing non-supportive shoes. Of note, patient presented to appointment today wearing the offending boot on her left foot and a bedroom slipper on her right foot.  -recommended wearing supportive shoes and considering an OTC cushioned insole  -patient to try rolling foot over a frozen water bottle several times per day and gentle stretching of the fascia  -Rx for Mobic given  -f/u with [facility] as needed

## 2016-04-30 ENCOUNTER — Ambulatory Visit (INDEPENDENT_AMBULATORY_CARE_PROVIDER_SITE_OTHER): Payer: Medicaid Other

## 2016-04-30 DIAGNOSIS — Z95810 Presence of automatic (implantable) cardiac defibrillator: Secondary | ICD-10-CM

## 2016-04-30 DIAGNOSIS — I5022 Chronic systolic (congestive) heart failure: Secondary | ICD-10-CM

## 2016-04-30 NOTE — Progress Notes (Signed)
EPIC Encounter for ICM Monitoring  Patient Name: [name] is a 56 y.o. female Date: 04/30/2016 Primary Care Physican: [name], MD Primary Cardiologist:Crenshaw Electrophysiologist: Allred Dry Weight:unknown     Heart Failure questions reviewed, pt asymptomatic.  Patient had ER visit for foot pain and dx with tendinitis of the Achilles tendon.  She was instructed to take Ibuprofen when at the ER and PCP prescribed Meloxicam this week but instructed not to take both. She decided not to take Meloxicam and is only taking Ibuprofen.    Advised Ibuprofen and ASA increase risk for GI bleed and Ibuprofen can worsened HF.  Reviewed GI symptoms to report and if to call if she has any fluid symptoms.    Thoracic impedance close to baseline and has improved. Fluid index > threshold.  Confirmed she is taking Furosemide 120 mg bid and Potassium 20 meq 1 tablet bid.   Labs: 03/08/2016 Creatinine 1.38, BUN 19, Potassium 4.4, Sodium 139 02/23/2016 Creatinine 1.54, BUN 33, Potassium 4.5, Sodium 139  07/21/2015 Creatinine 1.42, BUN 33, Potassium 4.7, Sodium 136  09/22/2016Creatinine 1.26, BUN 33, Potassium 4.5, Sodium 136   Recommendations:  Advised would send copy of ICM check to Dr Stanford Breed and Dr Rayann Heman for review and if any recommendations will call her back.   Follow-up plan: ICM clinic phone appointment on 05/14/2016 to recheck fluid levels.    3 month ICM trend: 04/30/2016   1 Year ICM trend:      [name], RN 04/30/2016 8:48 AM

## 2016-05-14 ENCOUNTER — Ambulatory Visit (INDEPENDENT_AMBULATORY_CARE_PROVIDER_SITE_OTHER): Payer: Medicaid Other

## 2016-05-14 DIAGNOSIS — I5022 Chronic systolic (congestive) heart failure: Secondary | ICD-10-CM

## 2016-05-14 DIAGNOSIS — Z95810 Presence of automatic (implantable) cardiac defibrillator: Secondary | ICD-10-CM

## 2016-05-14 NOTE — Progress Notes (Signed)
EPIC Encounter for ICM Monitoring  Patient Name: [name] is a 56 y.o. female Date: 05/14/2016 Primary Care Physican: [name], MD Primary Cardiologist:Crenshaw Electrophysiologist: Allred Dry Weight:unknown                                        Heart Failure questions reviewed, pt asymptomatic   Thoracic impedance is normal today.  She still is having a lot of foot pain related to the tendinitis.    Labs: 03/08/2016 Creatinine 1.38, BUN 19, Potassium 4.4, Sodium 139 02/23/2016 Creatinine 1.54, BUN 33, Potassium 4.5, Sodium 139  07/21/2015 Creatinine 1.42, BUN 33, Potassium 4.7, Sodium 136  09/22/2016Creatinine 1.26, BUN 33, Potassium 4.5, Sodium 136   Recommendations: No changes. Reminded to limit dietary salt intake to 2000 mg/day and fluid intake to < 2 liters/day. Encouraged to call for fluid symptoms.  Follow-up plan: ICM clinic phone appointment on 06/01/2016.  Copy of ICM check sent to device physician.   3 month ICM trend: 05/14/2016   1 Year ICM trend:      [name], RN 05/14/2016 7:43 AM

## 2016-05-28 ENCOUNTER — Ambulatory Visit: Payer: Medicaid Other | Admitting: Family Medicine

## 2016-06-01 ENCOUNTER — Ambulatory Visit (INDEPENDENT_AMBULATORY_CARE_PROVIDER_SITE_OTHER): Payer: Medicaid Other

## 2016-06-01 DIAGNOSIS — Z95810 Presence of automatic (implantable) cardiac defibrillator: Secondary | ICD-10-CM | POA: Diagnosis not present

## 2016-06-01 DIAGNOSIS — I5022 Chronic systolic (congestive) heart failure: Secondary | ICD-10-CM

## 2016-06-01 NOTE — Progress Notes (Signed)
EPIC Encounter for ICM Monitoring  Patient Name: Nyila Saltos is a 56 y.o. female Date: 06/01/2016 Primary Care Physican: [name], MD Primary Cardiologist:Crenshaw Electrophysiologist: Allred Dry Weight:unknown      Heart Failure questions reviewed, pt asymptomatic.   Thoracic impedance normal   Labs: 03/08/2016 Creatinine 1.38, BUN 19, Potassium 4.4, Sodium 139 02/23/2016 Creatinine 1.54, BUN 33, Potassium 4.5, Sodium 139  07/21/2015 Creatinine 1.42, BUN 33, Potassium 4.7, Sodium 136  09/22/2016Creatinine 1.26, BUN 33, Potassium 4.5, Sodium 136   Recommendations: No changes. Reminded to limit dietary salt intake to 2000 mg/day and fluid intake to < 2 liters/day. Encouraged to call for fluid symptoms.  Follow-up plan: ICM clinic phone appointment on 07/02/2016.  Copy of ICM check sent to device physician.   3 month ICM trend: 05/31/2016   1 Year ICM trend:      [name], RN 06/01/2016 8:36 AM

## 2016-06-03 ENCOUNTER — Encounter: Payer: Self-pay | Admitting: *Deleted

## 2016-06-03 NOTE — Telephone Encounter (Signed)
This encounter was created in error - please disregard.

## 2016-06-04 ENCOUNTER — Ambulatory Visit: Payer: Medicaid Other | Admitting: Family Medicine

## 2016-06-09 ENCOUNTER — Telehealth: Payer: Self-pay | Admitting: *Deleted

## 2016-06-09 NOTE — Telephone Encounter (Signed)
PA for entresto started [id number]. Statis pending review.

## 2016-06-10 ENCOUNTER — Telehealth: Payer: Self-pay | Admitting: Cardiology

## 2016-06-10 NOTE — Telephone Encounter (Signed)
Returned call to patient. She states she is calling about Entresto. Informed her that PA was sent in on 2/21 (yesterday) for this and we are waiting to hear back from insurance company regarding approval. She states she has 7 days left of the samples originally provided. She would like to be notified of outcome of PA. Will route to Hilda Blades, RN to follow up when info is available.

## 2016-06-10 NOTE — Telephone Encounter (Signed)
New message    Pt verbalized that [place] on [address] has   sent many faxes to the office for medication approval at discounted rate and she  Stated the pharmacy has not had any reply  Feb 14, 15 and 18th.

## 2016-06-30 ENCOUNTER — Encounter: Payer: Self-pay | Admitting: Family Medicine

## 2016-06-30 ENCOUNTER — Ambulatory Visit (INDEPENDENT_AMBULATORY_CARE_PROVIDER_SITE_OTHER): Payer: Medicaid Other | Admitting: Family Medicine

## 2016-06-30 ENCOUNTER — Ambulatory Visit
Admission: RE | Admit: 2016-06-30 | Discharge: 2016-06-30 | Disposition: A | Discharge status: Home / Self Care | Payer: Medicaid Other | Source: Ambulatory Visit | Attending: Sports Medicine | Admitting: Sports Medicine

## 2016-06-30 VITALS — BP 119/73 | HR 56 | Ht 65.5 in | Wt 190.0 lb

## 2016-06-30 DIAGNOSIS — M79671 Pain in right foot: Secondary | ICD-10-CM

## 2016-06-30 MED ORDER — DICLOFENAC SODIUM 1 % TD GEL: 2.0000 g | Freq: Four times a day (QID) | TRANSDERMAL | 2 refills | Status: DC

## 2016-06-30 NOTE — Assessment & Plan Note (Signed)
Pain could be related to a metatarsalgia. Exam is doesn't suggest plantar fasciitis. Possible for a stress reaction if she has been walking with an abnormal gait. Has limited dorsiflexion which could be related to the posterior calcaneal pain with insertional achilles pain.   - x-ray right foot  - voltaren gel - will call with results.

## 2016-06-30 NOTE — Progress Notes (Signed)
  [name] - 56 y.o. female MRN [id number]  Date of birth: December 14, 1960  SUBJECTIVE:  Including CC & ROS.   [name] is a 56 yo F that is presenting with right ankle and foot pain. She reports the pain has been intermittent since January. She has pain on the posterior aspect of her heel. Now she is also having pain on the lateral plantar forefoot. She has tried ice, ibuprofen and mobic with limited improvement. She feels like the ice might make it worse. She denies any injury or inciting event. She's never had surgery on her foot before. She has pain with walking and has started to light. She is wearing 2 different shoes today. She denies any numbness.  ROS: No unexpected weight loss, fever, chills, swelling, instability, muscle pain, numbness, redness, otherwise see HPI    HISTORY: Past Medical, Surgical, Social, and Family History Reviewed & Updated per EMR.   Pertinent Historical Findings include: PMSHx -  Heart failure, HLD, COPD PSHx -   Former smoker   DATA REVIEWED: none  PHYSICAL EXAM:  VS: BP:119/73  HR:(!) 56bpm  TEMP: ( )  RESP:   HT:5' 5.5" (166.4 cm)   WT:190 lb (86.2 kg)  BMI:31.2 PHYSICAL EXAM: Gen: NAD, alert, cooperative with exam, well-appearing HEENT: clear conjunctiva, EOMI CV:  no edema, capillary refill brisk,  Resp: non-labored, normal speech Skin: no rashes, normal turgor  Neuro: no gross deficits.  Psych:  alert and oriented Right foot:  No overlying ecchymosis. No tenderness to palpation of the medial or lateral epicondyle. Tenderness to palpation of the insertion of the [facility] in the posterior heel. No tenderness to palpation over the plantar medial aspect of the calcaneus. Some tenderness to palpation of the plantar aspect of the mid shaft fifth metatarsal No abnormal callus formation. Limited dorsiflexion. Normal strength resistance. Pain with rising on her tiptoes. Unable to stand just on her right foot. Appears to be a neutral arch upon  standing. Does walk with a limp. Neurovascularly intact  ASSESSMENT & PLAN:   Right foot pain Pain could be related to a metatarsalgia. Exam is doesn't suggest plantar fasciitis. Possible for a stress reaction if she has been walking with an abnormal gait. Has limited dorsiflexion which could be related to the posterior calcaneal pain with insertional achilles pain.   - x-ray right foot  - voltaren gel - will call with results.

## 2016-07-02 ENCOUNTER — Ambulatory Visit (INDEPENDENT_AMBULATORY_CARE_PROVIDER_SITE_OTHER): Payer: Medicaid Other

## 2016-07-02 ENCOUNTER — Other Ambulatory Visit: Payer: Self-pay | Admitting: *Deleted

## 2016-07-02 DIAGNOSIS — Z95810 Presence of automatic (implantable) cardiac defibrillator: Secondary | ICD-10-CM

## 2016-07-02 DIAGNOSIS — I5022 Chronic systolic (congestive) heart failure: Secondary | ICD-10-CM | POA: Diagnosis not present

## 2016-07-02 MED ORDER — DICLOFENAC SODIUM 1 % TD GEL: 2.0000 g | Freq: Four times a day (QID) | TRANSDERMAL | 2 refills | Status: DC

## 2016-07-02 NOTE — Progress Notes (Signed)
EPIC Encounter for ICM Monitoring  Patient Name: [name] is a 56 y.o. female Date: 07/02/2016 Primary Care Physican: [name], MD Primary Cardiologist:Crenshaw Electrophysiologist: Allred Dry Weight:unknown       Heart Failure questions reviewed, pt asymptomatic    Thoracic impedance improved and close to baseline    Prescribed and confirmed dosage: Furosemide 40 mg 3 tablets (120 mg total) twice a day.  Potassium 20 mEq 1 tablet twice a day  Labs: 03/08/2016 Creatinine 1.38, BUN 19, Potassium 4.4, Sodium 139 02/23/2016 Creatinine 1.54, BUN 33, Potassium 4.5, Sodium 139  07/21/2015 Creatinine 1.42, BUN 33, Potassium 4.7, Sodium 136  09/22/2016Creatinine 1.26, BUN 33, Potassium 4.5, Sodium 136   Recommendations: No changes. Reminded to limit dietary salt intake to 2000 mg/day and fluid intake to < 2 liters/day. Encouraged to call for fluid symptoms.  Follow-up plan: ICM clinic phone appointment on 07/15/2016 to recheck fluid levels.  Copy of ICM check sent to primary cardiologist and device physician.   3 month ICM trend: 07/02/2016   1 Year ICM trend:      [name], RN 07/02/2016 7:38 AM

## 2016-07-05 ENCOUNTER — Telehealth: Payer: Self-pay | Admitting: Family Medicine

## 2016-07-05 ENCOUNTER — Telehealth: Payer: Self-pay | Admitting: *Deleted

## 2016-07-05 NOTE — Telephone Encounter (Signed)
Informed patient her images were negative

## 2016-07-05 NOTE — Telephone Encounter (Signed)
Spoke with patient about her xray results.   Rosemarie Ax, MD PGY-4, [facility] 07/05/2016, 3:09 PM

## 2016-07-15 ENCOUNTER — Ambulatory Visit (INDEPENDENT_AMBULATORY_CARE_PROVIDER_SITE_OTHER): Payer: Medicaid Other

## 2016-07-15 DIAGNOSIS — I5022 Chronic systolic (congestive) heart failure: Secondary | ICD-10-CM

## 2016-07-15 DIAGNOSIS — Z95810 Presence of automatic (implantable) cardiac defibrillator: Secondary | ICD-10-CM

## 2016-07-16 NOTE — Progress Notes (Signed)
EPIC Encounter for ICM Monitoring  Patient Name: [name] is a 56 y.o. female Date: 07/16/2016 Primary Care Physican: [name], MD Primary Cardiologist:Crenshaw Electrophysiologist: Allred Dry Weight:does not weigh         Heart Failure questions reviewed, pt symptomatic with feeling full.   Thoracic impedance normal.  Prescribed and confirmed dosage: Furosemide 40 mg 3 tablets (120 mg total) twice a day.  Potassium 20 mEq 1 tablet twice a day.  Per Dr Jacalyn Lefevre office note 02/09/2016, patient instructed to weigh daily and take an additional 40 mg of Lasix for weight gain of 2-3 pounds.  Labs: 03/08/2016 Creatinine 1.38, BUN 19, Potassium 4.4, Sodium 139 02/23/2016 Creatinine 1.54, BUN 33, Potassium 4.5, Sodium 139  07/21/2015 Creatinine 1.42, BUN 33, Potassium 4.7, Sodium 136  09/22/2016Creatinine 1.26, BUN 33, Potassium 4.5, Sodium 136   Recommendations:  Advised to follow Dr Jacalyn Lefevre instructions and take 1 extra Furosemide tablet x 2 days and then return to taking prior dosage.  Reminded to limit dietary salt intake to 2000 mg/day and fluid intake to < 2 liters/day. Encouraged to call for fluid symptoms.  Follow-up plan: ICM clinic phone appointment on 07/20/2016 to recheck fluid levels.  Copy of ICM check sent to primary cardiologist and device physician.   3 month ICM trend: 07/15/2016   1 Year ICM trend:      [name], RN 07/16/2016 8:42 AM

## 2016-07-20 ENCOUNTER — Ambulatory Visit (INDEPENDENT_AMBULATORY_CARE_PROVIDER_SITE_OTHER): Payer: Medicaid Other

## 2016-07-20 DIAGNOSIS — Z95810 Presence of automatic (implantable) cardiac defibrillator: Secondary | ICD-10-CM

## 2016-07-20 DIAGNOSIS — I5022 Chronic systolic (congestive) heart failure: Secondary | ICD-10-CM

## 2016-07-20 NOTE — Progress Notes (Signed)
EPIC Encounter for ICM Monitoring  Patient Name: [name] is a 56 y.o. female Date: 07/20/2016 Primary Care Physican: [name], MD Primary Cardiologist:Crenshaw Electrophysiologist: Allred Dry Weight:does not weigh      Heart Failure questions reviewed, pt asymptomatic.   Thoracic impedance remains abnormal suggesting fluid accumulation after taking extra Furosemide x 2 days.  Explained taking 1 extra tablet for 2 days did not improve her fluid levels.    Prescribed and confirmed dosage: Furosemide 40 mg 3 tablets (120 mg total) twice a day. Potassium 20 mEq 1 tablet twice a day.  Per Dr Jacalyn Lefevre office note 02/09/2016, patient instructed to weigh daily and take an additional 40 mg of Lasix for weight gain of 2-3 pounds.  Labs: 03/08/2016 Creatinine 1.38, BUN 19, Potassium 4.4, Sodium 139 02/23/2016 Creatinine 1.54, BUN 33, Potassium 4.5, Sodium 139  07/21/2015 Creatinine 1.42, BUN 33, Potassium 4.7, Sodium 136  09/22/2016Creatinine 1.26, BUN 33, Potassium 4.5, Sodium 136   Recommendations: No changes. She reported staying on low salt diet.  Reminded to limit salt intake to 2000 mg/day and fluid intake to < 2 liters/day. Encouraged to call for fluid symptoms.  Follow-up plan: ICM clinic phone appointment on 08/09/2016.   Copy of ICM check sent to Dr Stanford Breed and Dr Rayann Heman for review and if any further recommendations will call her back.     3 month ICM trend: 07/20/2016   1 Year ICM trend:      [name], RN 07/20/2016 9:23 AM

## 2016-07-28 ENCOUNTER — Ambulatory Visit: Payer: Medicaid Other | Admitting: Family Medicine

## 2016-08-02 NOTE — Progress Notes (Signed)
Received: 5 days ago  Message Contents  Thompson Grayer, MD  [name], RN        I worry about the trend... Hopefully we can keep him asymptomatic with our protocol

## 2016-08-09 ENCOUNTER — Ambulatory Visit (INDEPENDENT_AMBULATORY_CARE_PROVIDER_SITE_OTHER): Payer: Medicaid Other

## 2016-08-09 DIAGNOSIS — I5022 Chronic systolic (congestive) heart failure: Secondary | ICD-10-CM | POA: Diagnosis not present

## 2016-08-09 DIAGNOSIS — Z95810 Presence of automatic (implantable) cardiac defibrillator: Secondary | ICD-10-CM

## 2016-08-09 NOTE — Progress Notes (Signed)
EPIC Encounter for ICM Monitoring  Patient Name: [name] is a 55 y.o. female Date: 08/09/2016 Primary Care Physican: Samantha Paganini, MD Primary Cardiologist:[name] Electrophysiologist: [name] Dry Weight:does not weigh           Heart Failure questions reviewed, pt asymptomatic .   Thoracic impedance returned to normal after decreasing fluid and salt intake.   Prescribed and confirmed dosage: Furosemide 40 mg 3 tablets (120 mg total) twice a day. Potassium 20 mEq 1 tablet twice a day. Per Dr [name] office note 02/09/2016, patient instructed to weigh daily and take an additional 40 mg of Lasix for weight gain of 2-3 pounds.  Labs: 03/08/2016 Creatinine 1.38, BUN 19, Potassium 4.4, Sodium 139 02/23/2016 Creatinine 1.54, BUN 33, Potassium 4.5, Sodium 139  07/21/2015 Creatinine 1.42, BUN 33, Potassium 4.7, Sodium 136  09/22/2016Creatinine 1.26, BUN 33, Potassium 4.5, Sodium 136   Recommendations: No changes. She reported she stays thirsty and dry which is the reason she tends to drink too much fluids.  Encouraged he to try hard candy or crushed ice instead of drinking large amounts of fluid.  Encouraged her to make an appointment with PCP for check up.    Follow-up plan: ICM clinic phone appointment on 09/09/2016.  Office appointment scheduled on 08/23/2016 with [name], [place].  Copy of ICM check sent to primary cardiologist and device physician.   3 month ICM trend: 08/09/2016   1 Year ICM trend:      Samantha Billings, RN 08/09/2016 1:31 PM

## 2016-08-12 ENCOUNTER — Other Ambulatory Visit: Payer: Self-pay | Admitting: Cardiology

## 2016-08-12 NOTE — Telephone Encounter (Signed)
Rx(s) sent to pharmacy electronically.  

## 2016-08-22 NOTE — Progress Notes (Deleted)
Cardiology Office Note    Date:  08/22/2016   ID:  [name], DOB 1960/09/05, MRN [id number]  PCP:  Samantha Crews, MD  Cardiologist: Dr. Stanford Bell  No chief complaint on file.   History of Present Illness:    [name] is a 56 y.o. female with past medical history of CAD (cath in 2012 showing 70% D1 stenosis, 50% OM, 50-60% LCx, and 20% RCA), severe AI (s/p AVR in 07/2011 with mitral valve repair at that time), chronic combined systolic and diastolic CHF (EF 70-26% by echo in 07/2015), NICM (s/p ICD placement in 2013), HTN, and HLD who presents to the office today for 85-month follow-up.   She was last examined by Dr. Stanford Bell in 01/2016 and denied any recent chest pain or dyspnea on exertion. She was continued on her current medication regimen. She has been switched from Lisinopril to [facility] in the interim. She is followed closely by the device clinic thoracic impedance was abnormal in 07/2016. Lasix was increased momentarily and she was instructed to limit her fluid and sodium intake. These values had returned to a normal range by her device check on 4/23.  Past Medical History:  Diagnosis Date  . Alcohol abuse    resolved  . Anxiety   . 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 Roxan Bell)  . Cannabis abuse   . CHF (congestive heart failure) ([place])   . Chronic systolic heart failure ([place])   . Cocaine abuse    resolved  . COPD (chronic obstructive pulmonary disease) ([place]) 12/27/2008       . Fibroids    abd. CT 2/12: large fibroid uterus  . High cholesterol   . History of blood transfusion 1980's   "after tubal pregnancy"  . Hypertension   . ICD (implantable cardiac defibrillator) in place   . Mitral stenosis   . Nonischemic cardiomyopathy ([place])    a. cath 05/2011 showed a 70% D1, a 50% OM1, a 50-60% left circumflex and a 20% right coronary artery. Ejection fraction was 20%. There was  mild MR and severe AI. There was mild pulmonary hypertension; b.echocardiogram 2/13 EF 20-25%, restrictive filling pattern, mod AS with mod  to severe AI. 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 mod to severe MR, mod LAE, mild RVE, mild RAE, mod TR  . Ovarian cyst    ovarian cystic mass  . Personal history of noncompliance with medical treatment, presenting hazards to health   . Pulmonary hypertension   . Pulmonary nodule, left    f/u chest CT 2/12: interval clearing of RUL air space nodule, borderline enlarged mediastinal lymph nodes stable, pul. arterial HTN  . Shortness of breath    "at rest" (01/04/2012)  . Ventricular tachycardia ([place])    LifeVest    Past Surgical History:  Procedure Laterality Date  . AORTIC VALVE REPLACEMENT  08/04/2011   Procedure: AORTIC VALVE REPLACEMENT (AVR);  Surgeon: Samantha Nakayama, MD;  Location: [facility];  Service: Open Heart Surgery;  Laterality: N/A;  WITH ROOT ENLARGEMENT  . CARDIAC CATHETERIZATION  2008; ~ 2011;  05/2011  . CARDIAC DEFIBRILLATOR PLACEMENT  01/04/2012   MDT Protecta XT VR ICD implanted by Dr [name]  . CARDIAC VALVE REPLACEMENT    . ECTOPIC PREGNANCY SURGERY  1980's  . IMPLANTABLE CARDIOVERTER DEFIBRILLATOR IMPLANT N/A 01/04/2012   Procedure: IMPLANTABLE CARDIOVERTER DEFIBRILLATOR IMPLANT;  Surgeon: Samantha Grayer, MD;  Location: [facility] CATH LAB;  Service:  Cardiovascular;  Laterality: N/A;  . LEFT AND RIGHT HEART CATHETERIZATION WITH CORONARY ANGIOGRAM N/A 06/07/2011   Procedure: LEFT AND RIGHT HEART CATHETERIZATION WITH CORONARY ANGIOGRAM;  Surgeon: Samantha M Martinique, MD;  Location[facility] CATH LAB;  Service: Cardiovascular;  Laterality: N/A;  . MITRAL VALVE REPAIR  08/04/2011   Procedure: MITRAL VALVE REPAIR (MVR);  Surgeon: Samantha Nakayama, MD;  Location: [facility];  Service: Open Heart Surgery;  Laterality: N/A;  . TEE WITHOUT CARDIOVERSION  06/08/2011   Procedure: TRANSESOPHAGEAL ECHOCARDIOGRAM (TEE);  Surgeon: Samantha Perla, MD;  Location[facility]  ENDOSCOPY;  Service: Cardiovascular;  Laterality: N/A;  . TONSILLECTOMY  ~ 1972    Current Medications: Outpatient Medications Prior to Visit  Medication Sig Dispense Refill  . aspirin EC 325 MG tablet Take 325 mg by mouth daily.    Marland Kitchen atorvastatin (LIPITOR) 80 MG tablet TAKE ONE TABLET BY MOUTH ONCE DAILY 90 tablet 0  . carvedilol (COREG) 12.5 MG tablet TAKE ONE TABLET BY MOUTH TWICE DAILY WITH MEALS 180 tablet 0  . diclofenac sodium (VOLTAREN) 1 % GEL Apply 2 g topically 4 (four) times daily. PRN 100 g 2  . furosemide (LASIX) 40 MG tablet TAKE 3 TABLETS BY MOUTH IN THE MORNING AND TAKE 3 TABLETS BY MOUTH IN THE AFTERNOON 540 tablet 3  . ibuprofen (ADVIL,MOTRIN) 600 MG tablet Take 1 tablet (600 mg total) by mouth 3 (three) times daily after meals. 30 tablet 0  . lidocaine (XYLOCAINE) 5 % ointment Apply 1 application topically 2 (two) times daily as needed. 35.44 g 0  . meloxicam (MOBIC) 7.5 MG tablet Take 1 tablet (7.5 mg total) by mouth daily. (Patient not taking: Reported on 04/30/2016) 30 tablet 0  . potassium chloride SA (KLOR-CON M20) 20 MEQ tablet Take 1 tablet (20 mEq total) by mouth 2 (two) times daily. 60 tablet 11  . sacubitril-valsartan (ENTRESTO) 24-26 MG Take 1 tablet by mouth 2 (two) times daily. 60 tablet 11  . spironolactone (ALDACTONE) 25 MG tablet TAKE ONE TABLET BY MOUTH ONCE DAILY 90 tablet 2   No facility-administered medications prior to visit.      Allergies:   Penicillins   Social History   Social History  . Marital status: Single    Spouse name: N/A  . Number of children: N/A  . Years of education: N/A   Social History Main Topics  . Smoking status: Former Smoker    Packs/day: 1.00    Years: 10.00    Types: Cigarettes    Quit date: 05/27/2010  . Smokeless tobacco: Never Used  . Alcohol use 8.4 oz/week    14 Shots of liquor per week     Comment: drinks 1-2 shots of tequila most days  . Drug use: Yes    Types: Marijuana, Cocaine     Comment: occasional  marijuana; no longer using cocaine  . Sexual activity: Yes   Other Topics Concern  . Not on file   Social History Narrative   Single no children   Tobacco Use - Hx of x-20 years   History Cocaine and Mariguana use     Family History:  The patient's ***family history includes COPD in her mother; Cancer in her brother; Cholecystitis in her mother; Heart attack in her maternal grandmother and mother; Heart disease in her maternal grandmother and mother; Hypertension in her mother.   Review of Systems:   Please see the history of present illness.     General:  No chills, fever, night  sweats or weight changes.  Cardiovascular:  No chest pain, dyspnea on exertion, edema, orthopnea, palpitations, paroxysmal nocturnal dyspnea. Dermatological: No rash, lesions/masses Respiratory: No cough, dyspnea Urologic: No hematuria, dysuria Abdominal:   No nausea, vomiting, diarrhea, bright red blood per rectum, melena, or hematemesis Neurologic:  No visual changes, wkns, changes in mental status. All other systems reviewed and are otherwise negative except as noted above.   Physical Exam:    VS:  LMP 08/05/2011    General: Well developed, well nourished,female appearing in no acute distress. Head: Normocephalic, atraumatic, sclera non-icteric, no xanthomas, nares are without discharge.  Neck: No carotid bruits. JVD not elevated.  Lungs: Respirations regular and unlabored, without wheezes or rales.  Heart: ***Regular rate and rhythm. No S3 or S4.  No murmur, no rubs, or gallops appreciated. Abdomen: Soft, non-tender, non-distended with normoactive bowel sounds. No hepatomegaly. No rebound/guarding. No obvious abdominal masses. Msk:  Strength and tone appear normal for age. No joint deformities or effusions. Extremities: No clubbing or cyanosis. No edema.  Distal pedal pulses are 2+ bilaterally. Neuro: Alert and oriented X 3. Moves all extremities spontaneously. No focal deficits noted. Psych:   Responds to questions appropriately with a normal affect. Skin: No rashes or lesions noted  Wt Readings from Last 3 Encounters:  06/30/16 190 lb (86.2 kg)  04/28/16 191 lb (86.6 kg)  02/09/16 187 lb 6.4 oz (85 kg)        Studies/Labs Reviewed:   EKG:  EKG is*** ordered today.  The ekg ordered today demonstrates ***  Recent Labs: 02/23/2016: Brain Natriuretic Peptide 311.6 03/08/2016: BUN 19; Creat 1.38; Potassium 4.4; Sodium 139   Lipid Panel    Component Value Date/Time   CHOL 151 07/21/2015 0929   TRIG 73 07/21/2015 0929   HDL 47 07/21/2015 0929   CHOLHDL 3.2 07/21/2015 0929   VLDL 15 07/21/2015 0929   LDLCALC 89 07/21/2015 0929   LDLDIRECT 109.6 06/18/2011 1040    Additional studies/ records that were reviewed today include:   Echocardiogram: 07/2015 Study Conclusions  - Left ventricle: The cavity size was moderately dilated. The   estimated ejection fraction was 25%. Diffuse hypokinesis. Doppler   parameters are consistent with elevated ventricular end-diastolic   filling pressure. - Aortic valve: Not well seen on SA images likely low gradient mild   to moderate AS - Mitral valve: S/P Mitral valve repair with trivial residual MR. - Left atrium: The atrium was moderately to severely dilated. - Atrial septum: No defect or patent foramen ovale was identified. - Line: A venous catheter was visualized in the superior vena cava,   with its tip in the right atrium. No abnormal features noted.   Assessment:    No diagnosis found.   Plan:   In order of problems listed above:  1. ***    Medication Adjustments/Labs and Tests Ordered: Current medicines are reviewed at length with the patient today.  Concerns regarding medicines are outlined above.  Medication changes, Labs and Tests ordered today are listed in the Patient Instructions below. There are no Patient Instructions on file for this visit.   Signed, Erma Heritage, PA-C  08/22/2016 7:41 PM      [facility] [place], [place], [place]  [address] Phone: [phone]; Fax: [phone]  [address] [place], [place] [address] Phone: [phone]

## 2016-08-23 ENCOUNTER — Ambulatory Visit: Payer: Medicaid Other | Admitting: Student

## 2016-08-28 DIAGNOSIS — I472 Ventricular tachycardia, unspecified: Secondary | ICD-10-CM

## 2016-08-28 HISTORY — DX: Ventricular tachycardia: I47.2

## 2016-08-28 HISTORY — DX: Ventricular tachycardia, unspecified: I47.20

## 2016-09-06 ENCOUNTER — Encounter (INDEPENDENT_AMBULATORY_CARE_PROVIDER_SITE_OTHER): Payer: Self-pay

## 2016-09-06 ENCOUNTER — Encounter: Payer: Self-pay | Admitting: Internal Medicine

## 2016-09-06 ENCOUNTER — Encounter: Payer: Self-pay | Admitting: Physician Assistant

## 2016-09-06 ENCOUNTER — Ambulatory Visit (INDEPENDENT_AMBULATORY_CARE_PROVIDER_SITE_OTHER): Payer: Medicaid Other | Admitting: Physician Assistant

## 2016-09-06 VITALS — BP 126/64 | HR 53 | Ht 65.5 in | Wt 189.0 lb

## 2016-09-06 DIAGNOSIS — E785 Hyperlipidemia, unspecified: Secondary | ICD-10-CM

## 2016-09-06 DIAGNOSIS — I428 Other cardiomyopathies: Secondary | ICD-10-CM | POA: Diagnosis not present

## 2016-09-06 DIAGNOSIS — Z4502 Encounter for adjustment and management of automatic implantable cardiac defibrillator: Secondary | ICD-10-CM | POA: Diagnosis not present

## 2016-09-06 DIAGNOSIS — I2510 Atherosclerotic heart disease of native coronary artery without angina pectoris: Secondary | ICD-10-CM | POA: Diagnosis not present

## 2016-09-06 DIAGNOSIS — I5022 Chronic systolic (congestive) heart failure: Secondary | ICD-10-CM | POA: Diagnosis not present

## 2016-09-06 DIAGNOSIS — Z952 Presence of prosthetic heart valve: Secondary | ICD-10-CM
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
## 2016-09-06 NOTE — Patient Instructions (Signed)
Medication Instructions: Almyra Deforest, PA recommends that you continue on your current medications as directed. Please refer to the Current Medication list given to you today.  Labwork: Your physician recommends that you return for lab work TODAY.  Testing/Procedures: NONE ORDERED  Follow-up: Your physician recommends that you schedule a follow-up appointment first available with Dr Rayann Heman.  Isaac Laud recommends that you schedule a follow-up appointment in 2 months with Dr Stanford Breed.  If you need a refill on your cardiac medications before your next appointment, please call your pharmacy.

## 2016-09-07 LAB — COMPREHENSIVE METABOLIC PANEL
A/G RATIO: 1.8 (ref 1.2–2.2)
ALT: 14 IU/L (ref 0–32)
AST: 17 IU/L (ref 0–40)
Albumin: 4.6 g/dL (ref 3.5–5.5)
Alkaline Phosphatase: 94 IU/L (ref 39–117)
BILIRUBIN TOTAL: 0.5 mg/dL (ref 0.0–1.2)
BUN/Creatinine Ratio: 17 (ref 9–23)
BUN: 21 mg/dL (ref 6–24)
CHLORIDE: 102 mmol/L (ref 96–106)
CO2: 25 mmol/L (ref 18–29)
Calcium: 9.5 mg/dL (ref 8.7–10.2)
Creatinine, Ser: 1.25 mg/dL — ABNORMAL HIGH (ref 0.57–1.00)
GFR calc non Af Amer: 48 mL/min/{1.73_m2} — ABNORMAL LOW (ref >59)
GFR, EST AFRICAN AMERICAN: 56 mL/min/{1.73_m2} — AB (ref >59)
GLUCOSE: 111 mg/dL — AB (ref 65–99)
Globulin, Total: 2.5 g/dL (ref 1.5–4.5)
POTASSIUM: 3.9 mmol/L (ref 3.5–5.2)
Sodium: 142 mmol/L (ref 134–144)
TOTAL PROTEIN: 7.1 g/dL (ref 6.0–8.5)

## 2016-09-07 LAB — MAGNESIUM: MAGNESIUM: 2.1 mg/dL (ref 1.6–2.3)

## 2016-09-08 NOTE — Addendum Note (Signed)
Addended by: Milderd Meager on: 09/08/2016 02:52 PM   Modules accepted: Orders

## 2016-09-09 ENCOUNTER — Ambulatory Visit (INDEPENDENT_AMBULATORY_CARE_PROVIDER_SITE_OTHER): Payer: Medicaid Other

## 2016-09-09 DIAGNOSIS — Z95810 Presence of automatic (implantable) cardiac defibrillator: Secondary | ICD-10-CM | POA: Diagnosis not present

## 2016-09-09 DIAGNOSIS — I5022 Chronic systolic (congestive) heart failure: Secondary | ICD-10-CM | POA: Diagnosis not present

## 2016-09-10 ENCOUNTER — Telehealth: Payer: Self-pay

## 2016-09-10 NOTE — Telephone Encounter (Signed)
Remote ICM transmission received.  Attempted patient call and no message. 

## 2016-09-10 NOTE — Progress Notes (Signed)
EPIC Encounter for ICM Monitoring  Patient Name: [name] is a 56 y.o. female Date: 09/10/2016 Primary Care Physican: Virginia Crews, MD Primary Cardiologist:[place] Electrophysiologist: Allred Dry Weight:does not weigh       Attempted call to patient and unable to reach.  Transmission reviewed.  Patient received ICD shock on 08/27/2016    Thoracic impedance abnormal suggesting fluid accumulation.  Prescribed dosage: Furosemide 40 mg 3 tablets (120 mg total) twice a day. Potassium 20 mEq 1 tablet twice a day.   Spironolactone 25 mg 1 tablet daily.  Per Dr Jacalyn Lefevre office note 02/09/2016, patient instructed to weigh daily and take an additional 40 mg of Lasix for weight gain of 2-3 pounds.  Labs: 09/06/2016 Creatinine 1.25, BUN 21, Potassium 3.9, Sodium 142 03/08/2016 Creatinine 1.38, BUN 19, Potassium 4.4, Sodium 139 02/23/2016 Creatinine 1.54, BUN 33, Potassium 4.5, Sodium 139  07/21/2015 Creatinine 1.42, BUN 33, Potassium 4.7, Sodium 136  09/22/2016Creatinine 1.26, BUN 33, Potassium 4.5, Sodium 136   Recommendations: NONE - Unable to reach patient   Follow-up plan: ICM clinic phone appointment on 09/30/2016 to recheck fluid levels and patient will have defib office check on 09/20/2016 with Dr Rayann Heman and 11/01/2016 with Dr Stanford Breed.  Copy of ICM check sent to primary cardiologist and device physician.   3 month ICM trend: 09/09/2016   1 Year ICM trend:      [name], RN 09/10/2016 7:55 AM

## 2016-09-20 ENCOUNTER — Encounter: Payer: Self-pay | Admitting: Internal Medicine

## 2016-09-20 ENCOUNTER — Ambulatory Visit (INDEPENDENT_AMBULATORY_CARE_PROVIDER_SITE_OTHER): Payer: Medicaid Other | Admitting: Internal Medicine

## 2016-09-20 VITALS — BP 113/77 | HR 57 | Ht 65.5 in | Wt 191.0 lb

## 2016-09-20 DIAGNOSIS — I428 Other cardiomyopathies: Secondary | ICD-10-CM | POA: Diagnosis not present

## 2016-09-20 DIAGNOSIS — Z95810 Presence of automatic (implantable) cardiac defibrillator: Secondary | ICD-10-CM

## 2016-09-20 DIAGNOSIS — I472 Ventricular tachycardia, unspecified: Secondary | ICD-10-CM

## 2016-09-20 DIAGNOSIS — I5022 Chronic systolic (congestive) heart failure: Secondary | ICD-10-CM | POA: Diagnosis not present

## 2016-09-20 NOTE — Patient Instructions (Addendum)
Medication Instructions:  Your physician recommends that you continue on your current medications as directed. Please refer to the Current Medication list given to you today.   Labwork: None ordered   Testing/Procedures:  Your physician has requested that you have an echocardiogram. Echocardiography is a painless test that uses sound waves to create images of your heart. It provides your doctor with information about the size and shape of your heart and how well your heart's chambers and valves are working. This procedure takes approximately one hour. There are no restrictions for this procedure.     Follow-Up:  Your physician recommends that you schedule a follow-up appointment in: 3 months with Chanetta Marshall, NP    Remote monitoring is used to monitor your  ICD from home. This monitoring reduces the number of office visits required to check your device to one time per year. It allows [place] to keep an eye on the functioning of your device to ensure it is working properly. You are scheduled for a device check from home on 10/04/16. You may send your transmission at any time that day. If you have a wireless device, the transmission will be sent automatically. After your physician reviews your transmission, you will receive a postcard with your next transmission date.    Any Other Special Instructions Will Be Listed Below (If Applicable).  NO DRIVING FOR 6  MONTHS     If you need a refill on your cardiac medications before your next appointment, please call your pharmacy.
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
## 2016-09-23 LAB — CUP PACEART INCLINIC DEVICE CHECK
Battery Voltage: 3.07 V
Brady Statistic RV Percent Paced: 8.11 %
Date Time Interrogation Session: 20180604194612
HIGH POWER IMPEDANCE MEASURED VALUE: 437 Ohm
HighPow Impedance: 75 Ohm
Lead Channel Impedance Value: 437 Ohm
Lead Channel Sensing Intrinsic Amplitude: 15.875 mV
Lead Channel Sensing Intrinsic Amplitude: 18.875 mV
Lead Channel Setting Pacing Amplitude: 2.5 V
Lead Channel Setting Pacing Pulse Width: 0.4 ms
MDC IDC LEAD IMPLANT DT: 20130917
MDC IDC LEAD LOCATION: 753860
MDC IDC LEAD SERIAL: 323765
MDC IDC MSMT LEADCHNL RV PACING THRESHOLD AMPLITUDE: 0.875 V
MDC IDC MSMT LEADCHNL RV PACING THRESHOLD PULSEWIDTH: 0.4 ms
MDC IDC PG IMPLANT DT: 20130917
MDC IDC SET LEADCHNL RV SENSING SENSITIVITY: 0.3 mV

## 2016-09-30 ENCOUNTER — Telehealth: Payer: Self-pay

## 2016-09-30 ENCOUNTER — Ambulatory Visit (INDEPENDENT_AMBULATORY_CARE_PROVIDER_SITE_OTHER): Payer: Self-pay

## 2016-09-30 DIAGNOSIS — Z95810 Presence of automatic (implantable) cardiac defibrillator: Secondary | ICD-10-CM

## 2016-09-30 DIAGNOSIS — I5022 Chronic systolic (congestive) heart failure: Secondary | ICD-10-CM

## 2016-09-30 NOTE — Telephone Encounter (Signed)
Remote ICM transmission received.  Attempted patient call and left detailed message regarding transmission and next ICM scheduled for 10/15/2016.  Advised to return call for any fluid symptoms or questions.    

## 2016-09-30 NOTE — Progress Notes (Signed)
EPIC Encounter for ICM Monitoring  Patient Name: [name] is a 56 y.o. female Date: 09/30/2016 Primary Care Physican: Virginia Crews, MD Primary Cardiologist:Crenshaw Electrophysiologist: Allred Dry Weight:does not weigh      Attempted call to patient and unable to reach.  Left detailed message regarding transmission.  Transmission reviewed.    Thoracic impedance normal.  Prescribed dosage: Furosemide 40 mg 3 tablets (120 mg total) twice a day. Potassium 20 mEq 1 tablet twice a day.   Spironolactone 25 mg 1 tablet daily.  Per Dr Jacalyn Lefevre office note 02/09/2016, patient instructed to weigh daily and take an additional 40 mg of Lasix for weight gain of 2-3 pounds.  Labs: 09/06/2016 Creatinine 1.25, BUN 21, Potassium 3.9, Sodium 142 03/08/2016 Creatinine 1.38, BUN 19, Potassium 4.4, Sodium 139 02/23/2016 Creatinine 1.54, BUN 33, Potassium 4.5, Sodium 139  07/21/2015 Creatinine 1.42, BUN 33, Potassium 4.7, Sodium 136  09/22/2016Creatinine 1.26, BUN 33, Potassium 4.5, Sodium 136   Recommendations:  Left voice mail with ICM number and encouraged to call for fluid symptoms..  Follow-up plan: ICM clinic phone appointment on 10/15/2016.  Office appointment 11/01/2016 with Dr Stanford Breed.  Copy of ICM check sent to device physician.   3 month ICM trend: 09/30/2016   1 Year ICM trend:      [name], RN 09/30/2016 7:43 AM

## 2016-10-05 ENCOUNTER — Other Ambulatory Visit (HOSPITAL_COMMUNITY): Payer: Medicaid Other

## 2016-10-14 ENCOUNTER — Other Ambulatory Visit (HOSPITAL_COMMUNITY): Payer: Medicaid Other

## 2016-10-15 ENCOUNTER — Ambulatory Visit (INDEPENDENT_AMBULATORY_CARE_PROVIDER_SITE_OTHER): Payer: Medicaid Other | Admitting: *Deleted

## 2016-10-15 DIAGNOSIS — I5022 Chronic systolic (congestive) heart failure: Secondary | ICD-10-CM

## 2016-10-15 DIAGNOSIS — Z95810 Presence of automatic (implantable) cardiac defibrillator: Secondary | ICD-10-CM

## 2016-10-15 DIAGNOSIS — I472 Ventricular tachycardia, unspecified: Secondary | ICD-10-CM

## 2016-10-15 NOTE — Progress Notes (Signed)
Remote ICD transmission.   

## 2016-10-15 NOTE — Progress Notes (Signed)
EPIC Encounter for ICM Monitoring  Patient Name: [name] is a 56 y.o. female Date: 10/15/2016 Primary Care Physican: Samantha Crews, MD Primary Cardiologist:Crenshaw Electrophysiologist: Allred Dry Weight:does not weigh       Heart Failure questions reviewed, pt asymptomatic yesterday and could tell she had some fluid but did not list specific symptoms.   Thoracic impedance slightly abnormal suggesting fluid accumulation for the last 2 days.  Prescribed dosage: Furosemide 40 mg 3 tablets (120 mg total) twice a day. Potassium 20 mEq 1 tablet twice a day. Spironolactone 25 mg 1 tablet daily. Per Dr Jacalyn Lefevre office note 02/09/2016, patient instructed to weigh daily and take an additional 40 mg of Lasix for weight gain of 2-3 pounds.  Labs: 09/06/2016 Creatinine 1.25, BUN 21, Potassium 3.9, Sodium 142 03/08/2016 Creatinine 1.38, BUN 19, Potassium 4.4, Sodium 139 02/23/2016 Creatinine 1.54, BUN 33, Potassium 4.5, Sodium 139  07/21/2015 Creatinine 1.42, BUN 33, Potassium 4.7, Sodium 136  09/22/2016Creatinine 1.26, BUN 33, Potassium 4.5, Sodium 136   Recommendations: She took extra Furosemide yesterday and today as instructed by Dr Stanford Breed when needed. Advised to limit salt intake to 2000 mg/day and fluid intake to < 2 liters/day.  Encouraged to call for fluid symptoms.  Follow-up plan: ICM clinic phone appointment on 11/15/2016.  Office appointment scheduled 11/01/2016 with Dr. Stanford Breed.  Copy of ICM check sent to primary cardiologist and device physician.   3 month ICM trend: 10/15/2016   1 Year ICM trend:      Samantha Billings, RN 10/15/2016 10:54 AM

## 2016-10-18 NOTE — Progress Notes (Signed)
HPI: FU AVR secondary to aortic stenosis and CAD. Cardiac catheterization in February of 2012 showed a 70% first diagonal, a 50% first marginal, a 50-60% left circumflex and a 20% right coronary artery. Ejection fraction was 20%. There was mild mitral regurgitation and severe aortic insufficiency. There was mild pulmonary hypertension. Patient had AVR 88mm Edwards Magna Ease Pericardial Valve Conduit Aortic Root Enlargement utilizing a Hemashield Graft; MV Annuloplasty utilizing 35mm Edwards Physio II Ring and ICD placed 4/13. Echocardiogram repeated April 2017 and showed ejection fraction 25%, elevated left ventricular filling pressure, mean aortic valve gradient 14 mmHg, prior mitral valve repair with trivial mitral regurgitation and moderate to severe left atrial enlargement. Note previous echocardiogram 12/15 had suggested moderate mitral regurgitation, severe tricuspid regurgitation with moderate to severe pulmonary hypertension. Patient had ICD fire May 2018 and was seen by Dr. Rayann Heman. Since she was last seen, she denies dyspnea, chest pain, palpitations or syncope. No pedal edema. She increased her Lasix to 160 mg twice a day as she was more short of breath on 60 twice a day.  Current Outpatient Prescriptions  Medication Sig Dispense Refill  . aspirin EC 325 MG tablet Take 325 mg by mouth daily.    Marland Kitchen atorvastatin (LIPITOR) 80 MG tablet TAKE ONE TABLET BY MOUTH ONCE DAILY 90 tablet 0  . carvedilol (COREG) 12.5 MG tablet TAKE ONE TABLET BY MOUTH TWICE DAILY WITH MEALS 180 tablet 0  . diclofenac sodium (VOLTAREN) 1 % GEL Apply 2 g topically 4 (four) times daily as needed (As directed for skin).    . furosemide (LASIX) 40 MG tablet TAKE 3 TABLETS BY MOUTH IN THE MORNING AND TAKE 3 TABLETS BY MOUTH IN THE AFTERNOON 540 tablet 3  . potassium chloride SA (KLOR-CON M20) 20 MEQ tablet Take 1 tablet (20 mEq total) by mouth 2 (two) times daily. 60 tablet 11  . sacubitril-valsartan (ENTRESTO) 24-26 MG  Take 1 tablet by mouth 2 (two) times daily. 60 tablet 11  . spironolactone (ALDACTONE) 25 MG tablet TAKE ONE TABLET BY MOUTH ONCE DAILY 90 tablet 2   No current facility-administered medications for this visit.      Past Medical History:  Diagnosis Date  . Alcohol abuse    resolved  . Anxiety   . 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 Roxan Hockey)  . Cannabis abuse   . CHF (congestive heart failure) ([place])   . Chronic systolic heart failure ([place])   . Cocaine abuse    resolved  . COPD (chronic obstructive pulmonary disease) ([place]) 12/27/2008       . Fibroids    abd. CT 2/12: large fibroid uterus  . High cholesterol   . History of blood transfusion 1980's   "after tubal pregnancy"  . Hypertension   . ICD (implantable cardiac defibrillator) in place   . Mitral stenosis   . Nonischemic cardiomyopathy ([place])    a. cath 05/2011 showed a 70% D1, a 50% OM1, a 50-60% left circumflex and a 20% right coronary artery. Ejection fraction was 20%. There was mild MR and severe AI. There was mild pulmonary hypertension; b.echocardiogram 2/13 EF 20-25%, restrictive filling pattern, mod AS with mod  to severe AI. 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 mod to severe MR, mod LAE, mild RVE, mild RAE, mod TR  . Ovarian cyst    ovarian cystic mass  . Personal history of noncompliance with medical treatment,  presenting hazards to health   . Pulmonary hypertension ([place])   . Pulmonary nodule, left    f/u chest CT 2/12: interval clearing of RUL air space nodule, borderline enlarged mediastinal lymph nodes stable, pul. arterial HTN  . Shortness of breath    "at rest" (01/04/2012)  . Ventricular tachycardia ([place]) 08/28/2016   CL 280 msec    Past Surgical History:  Procedure Laterality Date  . AORTIC VALVE REPLACEMENT  08/04/2011   Procedure: AORTIC VALVE REPLACEMENT (AVR);  Surgeon: Melrose Nakayama, MD;  Location: [facility];  Service: Open  Heart Surgery;  Laterality: N/A;  WITH ROOT ENLARGEMENT  . CARDIAC CATHETERIZATION  2008; ~ 2011;  05/2011  . CARDIAC DEFIBRILLATOR PLACEMENT  01/04/2012   MDT Protecta XT VR ICD implanted by Dr Rayann Heman  . CARDIAC VALVE REPLACEMENT    . ECTOPIC PREGNANCY SURGERY  1980's  . IMPLANTABLE CARDIOVERTER DEFIBRILLATOR IMPLANT N/A 01/04/2012   Procedure: IMPLANTABLE CARDIOVERTER DEFIBRILLATOR IMPLANT;  Surgeon: Thompson Grayer, MD;  Location: [facility] CATH LAB;  Service: Cardiovascular;  Laterality: N/A;  . LEFT AND RIGHT HEART CATHETERIZATION WITH CORONARY ANGIOGRAM N/A 06/07/2011   Procedure: LEFT AND RIGHT HEART CATHETERIZATION WITH CORONARY ANGIOGRAM;  Surgeon: Peter M Martinique, MD;  Location: [facility] CATH LAB;  Service: Cardiovascular;  Laterality: N/A;  . MITRAL VALVE REPAIR  08/04/2011   Procedure: MITRAL VALVE REPAIR (MVR);  Surgeon: Melrose Nakayama, MD;  Location: [facility];  Service: Open Heart Surgery;  Laterality: N/A;  . TEE WITHOUT CARDIOVERSION  06/08/2011   Procedure: TRANSESOPHAGEAL ECHOCARDIOGRAM (TEE);  Surgeon: Lelon Perla, MD;  Location: [facility] ENDOSCOPY;  Service: Cardiovascular;  Laterality: N/A;  . TONSILLECTOMY  ~ 1972    Social History   Social History  . Marital status: Single    Spouse name: N/A  . Number of children: N/A  . Years of education: N/A   Occupational History  . Not on file.   Social History Main Topics  . Smoking status: Former Smoker    Packs/day: 1.00    Years: 10.00    Types: Cigarettes    Quit date: 05/27/2010  . Smokeless tobacco: Never Used  . Alcohol use 8.4 oz/week    14 Shots of liquor per week     Comment: drinks 1-2 shots of tequila most days  . Drug use: Yes    Types: Marijuana, Cocaine     Comment: occasional marijuana; no longer using cocaine  . Sexual activity: Yes   Other Topics Concern  . Not on file   Social History Narrative   Single no children   Tobacco Use - Hx of x-20 years   History Cocaine and Mariguana use    Family History    Problem Relation Age of Onset  . Cancer Brother   . Hypertension Mother   . Heart disease Mother   . COPD Mother   . Cholecystitis Mother   . Heart attack Mother   . Coronary artery disease Unknown   . Heart disease Maternal Grandmother   . Heart attack Maternal Grandmother   . Anesthesia problems Neg Hx     ROS: no fevers or chills, productive cough, hemoptysis, dysphasia, odynophagia, melena, hematochezia, dysuria, hematuria, rash, seizure activity, orthopnea, PND, pedal edema, claudication. Remaining systems are negative.  Physical Exam: [name] well-nourished in no acute distress.  Skin is warm and dry.  HEENT is normal.  Neck is supple.  Chest is clear to auscultation with normal expansion.  Cardiovascular exam is regular rate and rhythm.  2/6 systolic murmur left sternal border. Abdominal exam nontender or distended. No masses palpated. Extremities show no edema. neuro grossly intact   A/P  1 chronic combined systolic/diastolic congestive heart failure-patient is doing well on present dose of Lasix (160 mg BID). I will continue. We discussed importance of low sodium diet and fluid restriction. She will take an additional 40 mg of Lasix for weight gain of 2-3 pounds. Check K and renal function.  2 coronary artery disease-continue aspirin and statin.  3 hypertension-blood pressure is controlled. Continue present medications.  4 hyperlipidemia-continue statin.  5 nonischemic cardiomyopathy-continue entresto and coreg.   6 status post ICD-management per electrophysiology.  7 history of aortic valve replacement/mitral valve repair-continue SBE prophylaxis.   Kirk Ruths, MD

## 2016-10-19 ENCOUNTER — Encounter: Payer: Self-pay | Admitting: Cardiology

## 2016-10-22 ENCOUNTER — Other Ambulatory Visit: Payer: Self-pay

## 2016-10-22 ENCOUNTER — Ambulatory Visit (HOSPITAL_COMMUNITY): Payer: Medicaid Other | Attending: Cardiovascular Disease

## 2016-10-22 DIAGNOSIS — I472 Ventricular tachycardia, unspecified: Secondary | ICD-10-CM

## 2016-10-22 DIAGNOSIS — I081 Rheumatic disorders of both mitral and tricuspid valves: Secondary | ICD-10-CM | POA: Diagnosis not present

## 2016-11-01 ENCOUNTER — Ambulatory Visit (INDEPENDENT_AMBULATORY_CARE_PROVIDER_SITE_OTHER): Payer: Medicaid Other | Admitting: Cardiology

## 2016-11-01 ENCOUNTER — Encounter: Payer: Self-pay | Admitting: Cardiology

## 2016-11-01 VITALS — BP 104/60 | HR 55 | Ht 65.5 in | Wt 191.0 lb

## 2016-11-01 DIAGNOSIS — I5022 Chronic systolic (congestive) heart failure: Secondary | ICD-10-CM

## 2016-11-01 DIAGNOSIS — I428 Other cardiomyopathies: Secondary | ICD-10-CM | POA: Diagnosis not present

## 2016-11-01 DIAGNOSIS — Z95810 Presence of automatic (implantable) cardiac defibrillator: Secondary | ICD-10-CM

## 2016-11-01 DIAGNOSIS — I2510 Atherosclerotic heart disease of native coronary artery without angina pectoris: Secondary | ICD-10-CM

## 2016-11-01 LAB — CUP PACEART REMOTE DEVICE CHECK
Battery Voltage: 3.08 V
Brady Statistic RV Percent Paced: 6.39 %
HighPow Impedance: 399 Ohm
HighPow Impedance: 71 Ohm
Implantable Lead Model: 181
Lead Channel Sensing Intrinsic Amplitude: 14.375 mV
Lead Channel Setting Pacing Amplitude: 2.5 V
Lead Channel Setting Pacing Pulse Width: 0.4 ms
MDC IDC LEAD IMPLANT DT: 20130917
MDC IDC LEAD LOCATION: 753860
MDC IDC LEAD SERIAL: 323765
MDC IDC MSMT LEADCHNL RV IMPEDANCE VALUE: 380 Ohm
MDC IDC MSMT LEADCHNL RV PACING THRESHOLD AMPLITUDE: 0.875 V
MDC IDC MSMT LEADCHNL RV PACING THRESHOLD PULSEWIDTH: 0.4 ms
MDC IDC MSMT LEADCHNL RV SENSING INTR AMPL: 14.375 mV
MDC IDC PG IMPLANT DT: 20130917
MDC IDC SESS DTM: 20180629041708
MDC IDC SET LEADCHNL RV SENSING SENSITIVITY: 0.3 mV

## 2016-11-01 MED ORDER — FUROSEMIDE 80 MG PO TABS: 160.0000 mg | ORAL_TABLET | Freq: Two times a day (BID) | ORAL | 3 refills | Status: DC

## 2016-11-01 MED ORDER — FUROSEMIDE 80 MG PO TABS: 80.0000 mg | ORAL_TABLET | Freq: Two times a day (BID) | ORAL | 3 refills | Status: DC

## 2016-11-01 NOTE — Patient Instructions (Signed)
Medication Instructions:   INCREASE FUROSEMIDE TO 160 MG TWICE DAILY= 2 OF THE 80 MG TABLETS ONCE DAILY  Labwork:  Your physician recommends that you HAVE LAB WORK TODAY  Follow-Up:  Your physician wants you to follow-up in: [place] will receive a reminder letter in the mail two months in advance. If you don't receive a letter, please call our office to schedule the follow-up appointment.   If you need a refill on your cardiac medications before your next appointment, please call your pharmacy.

## 2016-11-03 ENCOUNTER — Encounter: Payer: Self-pay | Admitting: *Deleted

## 2016-11-03 ENCOUNTER — Telehealth: Payer: Self-pay

## 2016-11-03 LAB — BASIC METABOLIC PANEL
BUN/Creatinine Ratio: 18 (ref 9–23)
BUN: 32 mg/dL — AB (ref 6–24)
CALCIUM: 9.6 mg/dL (ref 8.7–10.2)
CHLORIDE: 98 mmol/L (ref 96–106)
CO2: 20 mmol/L (ref 20–29)
Creatinine, Ser: 1.74 mg/dL — ABNORMAL HIGH (ref 0.57–1.00)
GFR calc Af Amer: 37 mL/min/{1.73_m2} — ABNORMAL LOW (ref >59)
GFR calc non Af Amer: 32 mL/min/{1.73_m2} — ABNORMAL LOW (ref >59)
GLUCOSE: 105 mg/dL — AB (ref 65–99)
POTASSIUM: 4.7 mmol/L (ref 3.5–5.2)
Sodium: 140 mmol/L (ref 134–144)

## 2016-11-03 NOTE — Telephone Encounter (Signed)
Call to patient per Dr. Rayann Heman to discuss BeAT-HF Study. Left message to call [phone].

## 2016-11-09 ENCOUNTER — Other Ambulatory Visit: Payer: Self-pay | Admitting: Cardiology

## 2016-11-15 ENCOUNTER — Ambulatory Visit (INDEPENDENT_AMBULATORY_CARE_PROVIDER_SITE_OTHER): Payer: Medicaid Other

## 2016-11-15 DIAGNOSIS — Z95810 Presence of automatic (implantable) cardiac defibrillator: Secondary | ICD-10-CM

## 2016-11-15 DIAGNOSIS — I5022 Chronic systolic (congestive) heart failure: Secondary | ICD-10-CM

## 2016-11-15 NOTE — Progress Notes (Signed)
EPIC Encounter for ICM Monitoring  Patient Name: [name] is a 56 y.o. female Date: 11/15/2016 Primary Care Physican: Caroline More, DO Primary Cardiologist:Crenshaw Electrophysiologist: Allred Dry Weight:does not weigh          Heart Failure questions reviewed, pt asymptomatic.   Thoracic impedance normal   Prescribed dosage: Furosemide 80 mg 2 tablets (160 mg total) twice a day. Potassium 20 mEq 1 tablet twice a day. Spironolactone 25 mg 1 tablet daily. Per Dr Jacalyn Lefevre office note 11/01/16, she may take an additional 40 mg of Lasix for weight gain of 2-3 pounds.  Labs: 11/01/2016 Creatinine 1.74, BUN 32, Potassium 4.7, Sodium 140, EGFR 32-37 09/06/2016 Creatinine 1.25, BUN 21, Potassium 3.9, Sodium 142 03/08/2016 Creatinine 1.38, BUN 19, Potassium 4.4, Sodium 139 02/23/2016 Creatinine 1.54, BUN 33, Potassium 4.5, Sodium 139  07/21/2015 Creatinine 1.42, BUN 33, Potassium 4.7, Sodium 136  09/22/2016Creatinine 1.26, BUN 33, Potassium 4.5, Sodium 136   Recommendations: No changes.   Encouraged to call for fluid symptoms.  Follow-up plan: ICM clinic phone appointment on 12/16/2016.    Copy of ICM check sent to device physician.   3 month ICM trend: 11/15/2016   1 Year ICM trend:      [name], RN 11/15/2016 11:49 AM

## 2016-12-07 ENCOUNTER — Other Ambulatory Visit: Payer: Self-pay | Admitting: Cardiology

## 2016-12-16 ENCOUNTER — Ambulatory Visit (INDEPENDENT_AMBULATORY_CARE_PROVIDER_SITE_OTHER): Payer: Medicaid Other

## 2016-12-16 DIAGNOSIS — Z95810 Presence of automatic (implantable) cardiac defibrillator: Secondary | ICD-10-CM | POA: Diagnosis not present

## 2016-12-16 DIAGNOSIS — I5022 Chronic systolic (congestive) heart failure: Secondary | ICD-10-CM | POA: Diagnosis not present

## 2016-12-16 NOTE — Progress Notes (Signed)
EPIC Encounter for ICM Monitoring  Patient Name: [name] is a 56 y.o. female Date: 12/16/2016 Primary Care Physican: Caroline More, DO Primary Cardiologist:Crenshaw Electrophysiologist: Allred Dry Weight:does not weigh      Heart Failure questions reviewed, pt asymptomatic.   Thoracic impedance normal.  Prescribed dosage: Furosemide 80 mg 2 tablets (160 mg total) twice a day. Potassium 20 mEq 1 tablet twice a day. Spironolactone 25 mg 1 tablet daily. Per Dr Jacalyn Lefevre office note 11/01/16, she may take an additional 40 mg of Lasix for weight gain of 2-3 pounds.  Labs: 11/01/2016 Creatinine 1.74, BUN 32, Potassium 4.7, Sodium 140, EGFR 32-37 09/06/2016 Creatinine 1.25, BUN 21, Potassium 3.9, Sodium 142 03/08/2016 Creatinine 1.38, BUN 19, Potassium 4.4, Sodium 139 02/23/2016 Creatinine 1.54, BUN 33, Potassium 4.5, Sodium 139  07/21/2015 Creatinine 1.42, BUN 33, Potassium 4.7, Sodium 136  09/22/2016Creatinine 1.26, BUN 33, Potassium 4.5, Sodium 136   Recommendations: No changes.  Encouraged to call for fluid symptoms.  Follow-up plan: ICM clinic phone appointment on 01/25/2017.   Copy of ICM check sent to Dr. Rayann Heman.   3 month ICM trend: 12/16/2016   1 Year ICM trend:      [name], RN 12/16/2016 8:24 AM

## 2017-01-25 ENCOUNTER — Ambulatory Visit (INDEPENDENT_AMBULATORY_CARE_PROVIDER_SITE_OTHER): Payer: Medicaid Other | Admitting: *Deleted

## 2017-01-25 DIAGNOSIS — Z95810 Presence of automatic (implantable) cardiac defibrillator: Secondary | ICD-10-CM | POA: Diagnosis not present

## 2017-01-25 DIAGNOSIS — I472 Ventricular tachycardia, unspecified: Secondary | ICD-10-CM

## 2017-01-25 DIAGNOSIS — I5022 Chronic systolic (congestive) heart failure: Secondary | ICD-10-CM

## 2017-01-25 NOTE — Progress Notes (Signed)
Remote ICD transmission.   

## 2017-01-25 NOTE — Progress Notes (Signed)
EPIC Encounter for ICM Monitoring  Patient Name: [name] is a 56 y.o. female Date: 01/25/2017 Primary Care Physican: Caroline More, DO Primary Cardiologist:Crenshaw Electrophysiologist: Allred Dry Weight:does not weigh                                                        Heart Failure questions reviewed, pt asymptomatic.    Thoracic impedance abnormal suggesting fluid accumulation since 01/18/2017.  Prescribed dosage: Furosemide 80 mg 2tablets (160 mg total) twice a day. Potassium 20 mEq 1 tablet twice a day. Spironolactone 25 mg 1 tablet daily. Per Dr Jacalyn Lefevre office note 11/01/16, she maytake an additional 40 mg of Lasix for weight gain of 2-3 pounds.  Labs: 11/01/2016 Creatinine 1.74, BUN 32, Potassium 4.7, Sodium 140, EGFR 32-37 09/06/2016 Creatinine 1.25, BUN 21, Potassium 3.9, Sodium 142 03/08/2016 Creatinine 1.38, BUN 19, Potassium 4.4, Sodium 139 02/23/2016 Creatinine 1.54, BUN 33, Potassium 4.5, Sodium 139  07/21/2015 Creatinine 1.42, BUN 33, Potassium 4.7, Sodium 136  09/22/2016Creatinine 1.26, BUN 33, Potassium 4.5, Sodium 136   Recommendations:  Advised to follow recommendation at last office visit with Dr Stanford Breed and take extra 40 mg of Furosemide x 2 days and then return to prescribed dosage.   Follow-up plan: ICM clinic phone appointment on 01/28/2017 (manual send).    Copy of ICM check sent to Dr. Rayann Heman and Dr. Stanford Breed.   3 month ICM trend: 01/25/2017   1 Year ICM trend:      [name], RN 01/25/2017 12:20 PM

## 2017-01-26 LAB — CUP PACEART REMOTE DEVICE CHECK
Battery Voltage: 3.05 V
HighPow Impedance: 437 Ohm
HighPow Impedance: 72 Ohm
Implantable Lead Location: 753860
Implantable Lead Model: 181
Implantable Lead Serial Number: 323765
Implantable Pulse Generator Implant Date: 20130917
Lead Channel Impedance Value: 399 Ohm
Lead Channel Pacing Threshold Amplitude: 0.75 V
Lead Channel Sensing Intrinsic Amplitude: 16 mV
Lead Channel Setting Pacing Pulse Width: 0.4 ms
Lead Channel Setting Sensing Sensitivity: 0.3 mV
MDC IDC LEAD IMPLANT DT: 20130917
MDC IDC MSMT LEADCHNL RV PACING THRESHOLD PULSEWIDTH: 0.4 ms
MDC IDC MSMT LEADCHNL RV SENSING INTR AMPL: 16 mV
MDC IDC SESS DTM: 20181009062608
MDC IDC SET LEADCHNL RV PACING AMPLITUDE: 2.5 V
MDC IDC STAT BRADY RV PERCENT PACED: 3.64 %

## 2017-01-28 ENCOUNTER — Encounter: Payer: Self-pay | Admitting: Cardiology

## 2017-01-28 ENCOUNTER — Ambulatory Visit (INDEPENDENT_AMBULATORY_CARE_PROVIDER_SITE_OTHER): Payer: Self-pay

## 2017-01-28 DIAGNOSIS — Z95810 Presence of automatic (implantable) cardiac defibrillator: Secondary | ICD-10-CM

## 2017-01-28 DIAGNOSIS — I5022 Chronic systolic (congestive) heart failure: Secondary | ICD-10-CM

## 2017-01-28 NOTE — Progress Notes (Signed)
EPIC Encounter for ICM Monitoring  Patient Name: [name] is a 56 y.o. female Date: 01/28/2017 Primary Care Physican: Caroline More, DO Primary Cardiologist:Crenshaw Electrophysiologist: Allred Dry Weight:does not weigh      Heart Failure questions reviewed, pt asymptomatic.   Thoracic impedance close to baseline normal after taking extra Furosemide x 2 days.  Prescribed dosage: Furosemide 80 mg 2tablets (160 mg total) twice a day. Potassium 20 mEq 1 tablet twice a day. Spironolactone 25 mg 1 tablet daily. Per Dr Jacalyn Lefevre office note 11/01/16, she maytake an additional 40 mg of Lasix for weight gain of 2-3 pounds.  Labs: 11/01/2016 Creatinine 1.74, BUN 32, Potassium 4.7, Sodium 140, EGFR 32-37 09/06/2016 Creatinine 1.25, BUN 21, Potassium 3.9, Sodium 142 03/08/2016 Creatinine 1.38, BUN 19, Potassium 4.4, Sodium 139 02/23/2016 Creatinine 1.54, BUN 33, Potassium 4.5, Sodium 139  07/21/2015 Creatinine 1.42, BUN 33, Potassium 4.7, Sodium 136  09/22/2016Creatinine 1.26, BUN 33, Potassium 4.5, Sodium 136   Recommendations: No changes.  Advised to limit salt intake to 2000 mg/day and fluid intake to < 2 liters/day.  Encouraged to call for fluid symptoms.  Follow-up plan: ICM clinic phone appointment on 03/14/2017.  Office appointment scheduled 02/09/2017 with Chanetta Marshall, NP.  Copy of ICM check sent to Dr. Rayann Heman.   3 month ICM trend: 01/28/2017   1 Year ICM trend:      [name], RN 01/28/2017 9:48 AM

## 2017-02-08 ENCOUNTER — Encounter: Payer: Self-pay | Admitting: Nurse Practitioner

## 2017-02-09 ENCOUNTER — Encounter: Payer: Medicaid Other | Admitting: Nurse Practitioner

## 2017-02-24 ENCOUNTER — Encounter: Payer: Medicaid Other | Admitting: Nurse Practitioner

## 2017-03-14 ENCOUNTER — Ambulatory Visit (INDEPENDENT_AMBULATORY_CARE_PROVIDER_SITE_OTHER): Payer: Medicaid Other

## 2017-03-14 DIAGNOSIS — Z95810 Presence of automatic (implantable) cardiac defibrillator: Secondary | ICD-10-CM | POA: Diagnosis not present

## 2017-03-14 DIAGNOSIS — I5022 Chronic systolic (congestive) heart failure: Secondary | ICD-10-CM

## 2017-03-15 NOTE — Progress Notes (Signed)
EPIC Encounter for ICM Monitoring  Patient Name: [name] is a 56 y.o. female Date: 03/15/2017 Primary Care Physican: Caroline More, DO Primary Cardiologist:Crenshaw Electrophysiologist: Allred Dry Weight:does not weigh          Heart Failure questions reviewed, pt asymptomatic.   Thoracic impedance normal.  Prescribed dosage: Furosemide 80 mg 2tablets (160 mg total) twice a day. Potassium 20 mEq 1 tablet twice a day. Spironolactone 25 mg 1 tablet daily. Per Dr Jacalyn Lefevre office note 11/01/16, she maytake an additional 40 mg of Lasix for weight gain of 2-3 pounds.  Labs: 11/01/2016 Creatinine 1.74, BUN 32, Potassium 4.7, Sodium 140, EGFR 32-37 09/06/2016 Creatinine 1.25, BUN 21, Potassium 3.9, Sodium 142 03/08/2016 Creatinine 1.38, BUN 19, Potassium 4.4, Sodium 139 02/23/2016 Creatinine 1.54, BUN 33, Potassium 4.5, Sodium 139  07/21/2015 Creatinine 1.42, BUN 33, Potassium 4.7, Sodium 136  09/22/2016Creatinine 1.26, BUN 33, Potassium 4.5, Sodium 136   Recommendations: No changes.   Encouraged to call for fluid symptoms.  Follow-up plan: ICM clinic phone appointment on 04/14/2017.  Office appointment scheduled 04/18/2017 with Tommye Standard, NP.  Copy of ICM check sent to Dr. Rayann Heman.   3 month ICM trend: 03/14/2017    1 Year ICM trend:       [name], RN 03/15/2017 7:51 AM

## 2017-03-18 ENCOUNTER — Other Ambulatory Visit: Payer: Self-pay | Admitting: Cardiology

## 2017-03-21 ENCOUNTER — Other Ambulatory Visit: Payer: Self-pay | Admitting: Cardiology

## 2017-03-21 NOTE — Telephone Encounter (Signed)
F/U call:Patient calling states that she is out of the medication.

## 2017-03-21 NOTE — Telephone Encounter (Signed)
Medication Detail    Disp Refills Start End   ENTRESTO 24-26 MG 60 tablet 11 03/21/2017    Sig: TAKE ONE TABLET BY MOUTH TWICE DAILY   Sent to pharmacy as: Delene Loll 24-26 MG   Notes to Pharmacy: Please consider 90 day supplies to promote better adherence   E-Prescribing Status: Receipt confirmed by pharmacy (03/21/2017 4:42 PM EST)   Pharmacy   [place], [place]

## 2017-03-21 NOTE — Telephone Encounter (Signed)
New Message      *STAT* If patient is at the pharmacy, call can be transferred to refill team.   1. Which medications need to be refilled? (please list name of each medication and dose if known)   sacubitril-valsartan (ENTRESTO) 24-26 MG Take 1 tablet by mouth 2 (two) times daily.   potassium chloride SA (KLOR-CON M20) 20 MEQ tablet Take 1 tablet (20 mEq total) by mouth 2 (two) times daily.     2. Which pharmacy/location (including street and city if local pharmacy) is medication to be sent to? [address]   3. Do they need a 30 day or 90 day supply?  [place]

## 2017-04-04 ENCOUNTER — Other Ambulatory Visit: Payer: Self-pay

## 2017-04-04 DIAGNOSIS — I5022 Chronic systolic (congestive) heart failure: Secondary | ICD-10-CM

## 2017-04-04 MED ORDER — FUROSEMIDE 80 MG PO TABS: 160.0000 mg | ORAL_TABLET | Freq: Two times a day (BID) | ORAL | 1 refills | Status: DC

## 2017-04-14 ENCOUNTER — Ambulatory Visit (INDEPENDENT_AMBULATORY_CARE_PROVIDER_SITE_OTHER): Payer: Medicaid Other

## 2017-04-14 DIAGNOSIS — Z95810 Presence of automatic (implantable) cardiac defibrillator: Secondary | ICD-10-CM

## 2017-04-14 DIAGNOSIS — I5022 Chronic systolic (congestive) heart failure: Secondary | ICD-10-CM

## 2017-04-15 ENCOUNTER — Telehealth: Payer: Self-pay

## 2017-04-15 NOTE — Telephone Encounter (Signed)
Remote ICM transmission received.  Attempted call to patient and left detailed message per DPR regarding transmission and next ICM scheduled for [id number].  Advised to return call for any fluid symptoms or questions.

## 2017-04-15 NOTE — Progress Notes (Signed)
EPIC Encounter for ICM Monitoring  Patient Name: [name] is a 56 y.o. female Date: 04/15/2017 Primary Care Physican: Caroline More, DO Primary Cardiologist:Crenshaw Electrophysiologist: Allred Dry Weight:does not weigh        Attempted call to patient and unable to reach.  Left detailed message regarding transmission.  Transmission reviewed.    Thoracic impedance normal.  Prescribed dosage: Furosemide 80 mg 2tablets (160 mg total) twice a day. Potassium 20 mEq 1 tablet twice a day. Spironolactone 25 mg 1 tablet daily. Per Dr Jacalyn Lefevre office note 11/01/16, she maytake an additional 40 mg of Lasix for weight gain of 2-3 pounds.  Labs: 11/01/2016 Creatinine 1.74, BUN 32, Potassium 4.7, Sodium 140, EGFR 32-37 09/06/2016 Creatinine 1.25, BUN 21, Potassium 3.9, Sodium 142 03/08/2016 Creatinine 1.38, BUN 19, Potassium 4.4, Sodium 139 02/23/2016 Creatinine 1.54, BUN 33, Potassium 4.5, Sodium 139  07/21/2015 Creatinine 1.42, BUN 33, Potassium 4.7, Sodium 136  09/22/2016Creatinine 1.26, BUN 33, Potassium 4.5, Sodium 136  Recommendations: Left voice mail with ICM number and encouraged to call if experiencing any fluid symptoms.  Follow-up plan: ICM clinic phone appointment on 05/19/2017.  Office appointment scheduled 04/18/2017 with Tommye Standard, PA.  Copy of ICM check sent to Dr. Rayann Heman.   3 month ICM trend: 04/14/2017    1 Year ICM trend:       [name], RN 04/15/2017 12:23 PM
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
## 2017-04-18 ENCOUNTER — Encounter (INDEPENDENT_AMBULATORY_CARE_PROVIDER_SITE_OTHER): Payer: Self-pay

## 2017-04-18 ENCOUNTER — Ambulatory Visit: Payer: Medicaid Other | Admitting: Physician Assistant

## 2017-04-18 VITALS — BP 100/68 | HR 56 | Ht 65.5 in | Wt 204.0 lb

## 2017-04-18 DIAGNOSIS — Z9889 Other specified postprocedural states: Secondary | ICD-10-CM

## 2017-04-18 DIAGNOSIS — Z952 Presence of prosthetic heart valve: Secondary | ICD-10-CM | POA: Diagnosis not present

## 2017-04-18 DIAGNOSIS — I472 Ventricular tachycardia, unspecified: Secondary | ICD-10-CM

## 2017-04-18 DIAGNOSIS — I428 Other cardiomyopathies: Secondary | ICD-10-CM | POA: Diagnosis not present

## 2017-04-18 DIAGNOSIS — I519 Heart disease, unspecified: Secondary | ICD-10-CM | POA: Diagnosis not present

## 2017-04-18 DIAGNOSIS — I5022 Chronic systolic (congestive) heart failure: Secondary | ICD-10-CM

## 2017-04-18 DIAGNOSIS — Z95810 Presence of automatic (implantable) cardiac defibrillator: Secondary | ICD-10-CM | POA: Diagnosis not present

## 2017-04-18 MED ORDER — FUROSEMIDE 80 MG PO TABS: 160.0000 mg | ORAL_TABLET | Freq: Two times a day (BID) | ORAL | 3 refills | Status: DC

## 2017-04-18 NOTE — Patient Instructions (Addendum)
Medication Instructions:   Your physician has requested that you have cardiac CT. Cardiac computed tomography (CT) is a painless test that uses an x-ray machine to take clear, detailed pictures of your heart. For further information please visit [email or web address]. Please follow instruction sheet as given.     If you need a refill on your cardiac medications before your next appointment, please call your pharmacy.  Labwork: NONE ORDERED  TODAY    Testing/Procedures: NONE ORDERED  TODAY    Follow-Up:  FIRST AVAILABLE WITH DR [place]    Your physician wants you to follow-up in: [place] will receive a reminder letter in the mail two months in advance. If you don't receive a letter, please call our office to schedule the follow-up appointment.    Remote monitoring is used to monitor your Pacemaker of ICD from home. This monitoring reduces the number of office visits required to check your device to one time per year. It allows [place] to keep an eye on the functioning of your device to ensure it is working properly. You are scheduled for a device check from home on . 05-19-17  You may send your transmission at any time that day. If you have a wireless device, the transmission will be sent automatically. After your physician reviews your transmission, you will receive a postcard with your next transmission date.

## 2017-05-19 ENCOUNTER — Ambulatory Visit (INDEPENDENT_AMBULATORY_CARE_PROVIDER_SITE_OTHER): Payer: Medicaid Other | Admitting: *Deleted

## 2017-05-19 DIAGNOSIS — I5022 Chronic systolic (congestive) heart failure: Secondary | ICD-10-CM | POA: Diagnosis not present

## 2017-05-19 DIAGNOSIS — I472 Ventricular tachycardia, unspecified: Secondary | ICD-10-CM

## 2017-05-19 DIAGNOSIS — Z95810 Presence of automatic (implantable) cardiac defibrillator: Secondary | ICD-10-CM

## 2017-05-19 NOTE — Progress Notes (Signed)
Remote ICD transmission.   

## 2017-05-19 NOTE — Progress Notes (Signed)
EPIC Encounter for ICM Monitoring  Patient Name: [name] is a 57 y.o. female Date: 05/19/2017 Primary Care Physican: Caroline More, DO Primary Cardiologist:[facility] Electrophysiologist: Allred Dry Weight:does not weigh      Heart Failure questions reviewed, pt asymptomatic.   Thoracic impedance normal.  Prescribed dosage: Furosemide 80 mg 2tablets (160 mg total) twice a day. Potassium 20 mEq 1 tablet twice a day. Spironolactone 25 mg 1 tablet daily.   Labs: 11/01/2016 Creatinine 1.74, BUN 32, Potassium 4.7, Sodium 140, EGFR 32-37 09/06/2016 Creatinine 1.25, BUN 21, Potassium 3.9, Sodium 142 03/08/2016 Creatinine 1.38, BUN 19, Potassium 4.4, Sodium 139 02/23/2016 Creatinine 1.54, BUN 33, Potassium 4.5, Sodium 139  07/21/2015 Creatinine 1.42, BUN 33, Potassium 4.7, Sodium 136  09/22/2016Creatinine 1.26, BUN 33, Potassium 4.5, Sodium 136  Recommendations: No changes.   Encouraged to call for fluid symptoms.  Follow-up plan: ICM clinic phone appointment on 06/20/2017.  Office appointment scheduled 05/23/2017 with Almyra Deforest, PA.  Copy of ICM check sent to Dr. Rayann Heman.   3 month ICM trend: 05/19/2017    1 Year ICM trend:       [name], RN 05/19/2017 1:53 PM

## 2017-05-20 ENCOUNTER — Encounter: Payer: Self-pay | Admitting: Cardiology

## 2017-05-23 ENCOUNTER — Ambulatory Visit: Payer: Medicaid Other | Admitting: Physician Assistant

## 2017-06-01 ENCOUNTER — Other Ambulatory Visit: Payer: Self-pay | Admitting: Cardiology

## 2017-06-03 LAB — CUP PACEART REMOTE DEVICE CHECK
Battery Voltage: 3.08 V
Brady Statistic RV Percent Paced: 7.14 %
HIGH POWER IMPEDANCE MEASURED VALUE: 80 Ohm
HighPow Impedance: 380 Ohm
Implantable Lead Implant Date: 20130917
Implantable Lead Model: 181
Implantable Lead Serial Number: 323765
Lead Channel Pacing Threshold Pulse Width: 0.4 ms
Lead Channel Sensing Intrinsic Amplitude: 14.25 mV
Lead Channel Sensing Intrinsic Amplitude: 14.25 mV
Lead Channel Setting Pacing Amplitude: 2.5 V
Lead Channel Setting Pacing Pulse Width: 0.4 ms
Lead Channel Setting Sensing Sensitivity: 0.3 mV
MDC IDC LEAD LOCATION: 753860
MDC IDC MSMT LEADCHNL RV IMPEDANCE VALUE: 380 Ohm
MDC IDC MSMT LEADCHNL RV PACING THRESHOLD AMPLITUDE: 0.875 V
MDC IDC PG IMPLANT DT: 20130917
MDC IDC SESS DTM: 20190131083430

## 2017-06-06 ENCOUNTER — Ambulatory Visit: Payer: Medicaid Other | Admitting: Physician Assistant

## 2017-06-17 ENCOUNTER — Telehealth: Payer: Self-pay | Admitting: Cardiology

## 2017-06-17 NOTE — Telephone Encounter (Signed)
Samples at the front desk-pt notified 

## 2017-06-17 NOTE — Telephone Encounter (Signed)
New message    Patient states she is on her last dose of Entresto.    Pt c/o medication issue:  1. Name of Medication: ENTRESTO 24-26 MG  2. How are you currently taking this medication (dosage and times per day)? TAKE ONE TABLET BY MOUTH TWICE DAILY  3. Are you having a reaction (difficulty breathing--STAT)? No  4. What is your medication issue? Patient states her pharmacy advised her they can not refill without an authorization.

## 2017-06-19 NOTE — Progress Notes (Deleted)
Cardiology Office Note   Date:  06/19/2017   ID:  [name], DOB Jan 04, 1961, MRN [id number]  PCP:  Caroline More, DO  Cardiologist: Dr. Stanford Breed Electrophysiologist: Dr. Rayann Heman No chief complaint on file.    History of Present Illness: [name] is a 57 y.o. female who presents for ongoing assessment and management of coronary artery disease, most recent cardiac catheterization 05/2010 revealing a 70% first diagonal 50% first marginal, 50-60% left circumflex, with a 20% right coronary artery, reduced EF of 20%.  The patient had subsequent AVR repair with a 23 mm Edwards magna ease pericardial valve, conduit aortic root enlargement, utilizing a Hemashield graft; MV annuloplasty using 30 mm Edwards Physio II Ring and ICD placed 4/13.  His ICD did fire in May 2018 and was followed up by Dr. Rayann Heman.  Remote checks of ICD and follow-up appointment with Newt Lukes, or ongoing per protocol.  Most recent evaluation was completed on 04/18/2017, by EP.  When last seen by Dr. Stanford Breed on 11/01/2016 she had increased her Lasix to 160 mg twice a day as she became more short of breath.  This dose was continued by Dr. Stanford Breed, along with recommendations to take an additional 40 mg of Lasix for weight gain of 2-3 pounds.  The patient was given instructions on low-sodium diet and fluid restriction.  Otherwise, she was continued on current medication regimen which included Entresto, Coreg, aspirin and statin therapy.     Past Medical History:  Diagnosis Date  . Alcohol abuse    resolved  . Anxiety   . 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 Roxan Hockey)  . Cannabis abuse   . CHF (congestive heart failure) ([place])   . Chronic systolic heart failure ([place])   . Cocaine abuse ([place])    resolved  . COPD (chronic obstructive pulmonary disease) ([place]) 12/27/2008       . Fibroids    abd. CT 2/12: large fibroid uterus  .  High cholesterol   . History of blood transfusion 1980's   "after tubal pregnancy"  . Hypertension   . ICD (implantable cardiac defibrillator) in place   . Mitral stenosis   . Nonischemic cardiomyopathy ([place])    a. cath 05/2011 showed a 70% D1, a 50% OM1, a 50-60% left circumflex and a 20% right coronary artery. Ejection fraction was 20%. There was mild MR and severe AI. There was mild pulmonary hypertension; b.echocardiogram 2/13 EF 20-25%, restrictive filling pattern, mod AS with mod  to severe AI. 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 mod to severe MR, mod LAE, mild RVE, mild RAE, mod TR  . Ovarian cyst    ovarian cystic mass  . Personal history of noncompliance with medical treatment, presenting hazards to health   . Pulmonary hypertension ([place])   . Pulmonary nodule, left    f/u chest CT 2/12: interval clearing of RUL air space nodule, borderline enlarged mediastinal lymph nodes stable, pul. arterial HTN  . Shortness of breath    "at rest" (01/04/2012)  . Ventricular tachycardia ([place]) 08/28/2016   CL 280 msec    Past Surgical History:  Procedure Laterality Date  . AORTIC VALVE REPLACEMENT  08/04/2011   Procedure: AORTIC VALVE REPLACEMENT (AVR);  Surgeon: Melrose Nakayama, MD;  Location: [place];  Service: Open Heart Surgery;  Laterality: N/A;  WITH ROOT ENLARGEMENT  . CARDIAC CATHETERIZATION  2008; ~ 2011;  05/2011  . CARDIAC DEFIBRILLATOR PLACEMENT  01/04/2012   MDT Protecta XT VR ICD implanted by Dr Rayann Heman  . CARDIAC VALVE REPLACEMENT    . ECTOPIC PREGNANCY SURGERY  1980's  . IMPLANTABLE CARDIOVERTER DEFIBRILLATOR IMPLANT N/A 01/04/2012   Procedure: IMPLANTABLE CARDIOVERTER DEFIBRILLATOR IMPLANT;  Surgeon: Thompson Grayer, MD;  [facility] CATH LAB;  Service: Cardiovascular;  Laterality: N/A;  . LEFT AND RIGHT HEART CATHETERIZATION WITH CORONARY ANGIOGRAM N/A 06/07/2011   Procedure: LEFT AND RIGHT HEART CATHETERIZATION WITH CORONARY ANGIOGRAM;  Surgeon: Peter M Martinique, MD;  Location: [facility]  CATH LAB;  Service: Cardiovascular;  Laterality: N/A;  . MITRAL VALVE REPAIR  08/04/2011   Procedure: MITRAL VALVE REPAIR (MVR);  Surgeon: Melrose Nakayama, MD;  Location: [place];  Service: Open Heart Surgery;  Laterality: N/A;  . TEE WITHOUT CARDIOVERSION  06/08/2011   Procedure: TRANSESOPHAGEAL ECHOCARDIOGRAM (TEE);  Surgeon: Lelon Perla, MD;  Location: [facility] ENDOSCOPY;  Service: Cardiovascular;  Laterality: N/A;  . TONSILLECTOMY  ~ 1972     Current Outpatient Medications  Medication Sig Dispense Refill  . aspirin EC 325 MG tablet Take 325 mg by mouth daily.    Marland Kitchen atorvastatin (LIPITOR) 80 MG tablet TAKE 1 TABLET BY MOUTH ONCE DAILY 90 tablet 3  . carvedilol (COREG) 12.5 MG tablet TAKE 1 TABLET BY MOUTH TWICE DAILY WITH MEALS 180 tablet 3  . ENTRESTO 24-26 MG TAKE ONE TABLET BY MOUTH TWICE DAILY 60 tablet 11  . furosemide (LASIX) 80 MG tablet Take 2 tablets (160 mg total) by mouth 2 (two) times daily. 120 tablet 3  . KLOR-CON M20 20 MEQ tablet TAKE ONE TABLET BY MOUTH TWICE DAILY 60 tablet 11  . spironolactone (ALDACTONE) 25 MG tablet TAKE 1 TABLET BY MOUTH ONCE DAILY 90 tablet 3   No current facility-administered medications for this visit.     Allergies:   Penicillins    Social History:  The patient  reports that she quit smoking about 7 years ago. Her smoking use included cigarettes. She has a 10.00 pack-year smoking history. she has never used smokeless tobacco. She reports that she drinks about 8.4 oz of alcohol per week. She reports that she uses drugs. Drugs: Marijuana and Cocaine.   Family History:  The patient's family history includes COPD in her mother; Cancer in her brother; Cholecystitis in her mother; Coronary artery disease in her unknown relative; Heart attack in her maternal grandmother and mother; Heart disease in her maternal grandmother and mother; Hypertension in her mother.    ROS: All other systems are reviewed and negative. Unless otherwise mentioned in H&P      PHYSICAL EXAM: VS:  LMP 08/05/2011  , BMI There is no height or weight on file to calculate BMI. GEN: Well nourished, well developed, in no acute distress  HEENT: normal  Neck: no JVD, carotid bruits, or masses Cardiac: ***RRR; no murmurs, rubs, or gallops,no edema  Respiratory:  clear to auscultation bilaterally, normal work of breathing GI: soft, nontender, nondistended, + BS MS: no deformity or atrophy  Skin: warm and dry, no rash Neuro:  Strength and sensation are intact Psych: euthymic mood, full affect   EKG:  EKG {ACTION; IS/IS WEX:[id number]} ordered today. The ekg ordered today demonstrates ***   Recent Labs: 09/06/2016: ALT 14; Magnesium 2.1 11/01/2016: BUN 32; Creatinine, Ser 1.74; Potassium 4.7; Sodium 140    Lipid Panel    Component Value Date/Time   CHOL 151 07/21/2015 0929   TRIG 73 07/21/2015 0929   HDL 47 07/21/2015 0929   CHOLHDL  3.2 07/21/2015 0929   VLDL 15 07/21/2015 0929   LDLCALC 89 07/21/2015 0929   LDLDIRECT 109.6 06/18/2011 1040      Wt Readings from Last 3 Encounters:  04/18/17 204 lb (92.5 kg)  11/01/16 191 lb (86.6 kg)  09/20/16 191 lb (86.6 kg)      Other studies Reviewed: Echocardiogram 11/01/2016 Left ventricle: The cavity size was severely dilated. Wall   thickness was increased in a pattern of moderate LVH. Systolic   function was severely reduced. The estimated ejection fraction   was in the range of 20% to 25%. Features are consistent with a   pseudonormal left ventricular filling pattern, with concomitant   abnormal relaxation and increased filling pressure (grade 2   diastolic dysfunction). - Aortic valve: A bioprosthesis was present. - Mitral valve: Prior procedures included surgical repair. The   findings are consistent with trivial stenosis. There was mild   regurgitation. - Left atrium: The atrium was mildly dilated. - Right ventricle: Systolic function was moderately reduced. - Right atrium: The atrium was moderately  dilated. - Tricuspid valve: There was mild-moderate regurgitation. - Pulmonary arteries: Systolic pressure was mildly to moderately   increased. PA peak pressure: 39 mm Hg (S). ASSESSMENT AND PLAN:  1.  ***   Current medicines are reviewed at length with the patient today.    Labs/ tests ordered today include: *** Phill Myron. [place] P[place], ANP, AACC   06/19/2017 11:14 AM    [facility]. [address], [place], [place] [address] Phone: [phone]; Fax: [phone]

## 2017-06-20 ENCOUNTER — Ambulatory Visit: Payer: Medicaid Other | Admitting: Adult Health

## 2017-06-20 ENCOUNTER — Ambulatory Visit (INDEPENDENT_AMBULATORY_CARE_PROVIDER_SITE_OTHER): Payer: Medicaid Other

## 2017-06-20 DIAGNOSIS — I5022 Chronic systolic (congestive) heart failure: Secondary | ICD-10-CM

## 2017-06-20 DIAGNOSIS — Z95810 Presence of automatic (implantable) cardiac defibrillator: Secondary | ICD-10-CM

## 2017-06-20 NOTE — Progress Notes (Signed)
EPIC Encounter for ICM Monitoring  Patient Name: [name] is a 57 y.o. female Date: 06/20/2017 Primary Care Physican: Caroline More, DO Primary Cardiologist:[facility] Electrophysiologist: Allred Dry Weight:does not weigh             Heart Failure questions reviewed, pt asymptomatic.   Thoracic impedance just starting to trend below baseline suggesting fluid accumulation.  She said she made soup with salt yesterday which may be the reason it is just starting to trend downward.   Prescribed dosage: Furosemide 80 mg 2tablets (160 mg total) twice a day. Potassium 20 mEq 1 tablet twice a day. Spironolactone 25 mg 1 tablet daily.   Labs: 11/01/2016 Creatinine 1.74, BUN 32, Potassium 4.7, Sodium 140, EGFR 32-37 09/06/2016 Creatinine 1.25, BUN 21, Potassium 3.9, Sodium 142 03/08/2016 Creatinine 1.38, BUN 19, Potassium 4.4, Sodium 139 02/23/2016 Creatinine 1.54, BUN 33, Potassium 4.5, Sodium 139  07/21/2015 Creatinine 1.42, BUN 33, Potassium 4.7, Sodium 136  09/22/2016Creatinine 1.26, BUN 33, Potassium 4.5, Sodium 136  Recommendations: No changes.  Encouraged to call for fluid symptoms.  Follow-up plan: ICM clinic phone appointment on 07/21/2017.  Office appointment scheduled 07/04/2017 with Jory Sims, NP.  Copy of ICM check sent to Dr. Rayann Heman.   3 month ICM trend: 06/20/2017    1 Year ICM trend:       [name], RN 06/20/2017 8:56 AM

## 2017-06-23 ENCOUNTER — Telehealth: Payer: Self-pay | Admitting: Physician Assistant

## 2017-06-23 ENCOUNTER — Telehealth: Payer: Self-pay | Admitting: Cardiology

## 2017-06-23 NOTE — Telephone Encounter (Signed)
New Message  Pt c/o medication issue:  1. Name of Medication: ENTRESTO 24-26 MG  2. How are you currently taking this medication (dosage and times per day)? TAKE ONE TABLET BY MOUTH TWICE DAILY  3. Are you having a reaction (difficulty breathing--STAT)? no  4. What is your medication issue? Pt verbalized that her insurance needs Cren[facility] to fill out some paper work in order for the medication to be covered. Please call

## 2017-06-23 NOTE — Telephone Encounter (Signed)
Spoke with heart tracks, PA for entresto complete and will get a response with in 24 hours. SP#[id number].

## 2017-06-23 NOTE — Telephone Encounter (Signed)
error 

## 2017-07-03 NOTE — Progress Notes (Signed)
Cardiology Office Note   Date:  07/04/2017   ID:  [name], DOB 08-05-1960, MRN [id number]  PCP:  Caroline More, DO  Cardiologist: Dr. Kirk Ruths Electrophysiologist: Dr. Rayann Heman Chief Complaint  Patient presents with  . Follow-up  . Palpitations     History of Present Illness: [name] is a 57 y.o. female who presents for ongoing assessment and management of VHD, history of AVR (bioprosthetic), also history of aortic root graft, mitral valve repair, pulmonary hypertension, nonischemic cardiomyopathy (nonobstructive CAD by cath in 2012), also ICD in situ with history of VT with appropriate shocks, COPD, hypertension, hyperlipidemia, and chronic heart failure.  She was last seen by cardiology on 04/18/2017 by Newt Lukes, and was doing well.  Her weight had risen,and optivol did reveal fluid accumulation. She was advised to take extra dose of Lasix that day. Continue to monitor her weight and report any weight gain or fluid. She states after taking extra dose of Lasix, she felt better and offers no complaints now.  Past Medical History:  Diagnosis Date  . Alcohol abuse    resolved  . Anxiety   . 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 Roxan Hockey)  . Cannabis abuse   . CHF (congestive heart failure) ([place])   . Chronic systolic heart failure ([place])   . Cocaine abuse ([place])    resolved  . COPD (chronic obstructive pulmonary disease) ([place]) 12/27/2008       . Fibroids    abd. CT 2/12: large fibroid uterus  . High cholesterol   . History of blood transfusion 1980's   "after tubal pregnancy"  . Hypertension   . ICD (implantable cardiac defibrillator) in place   . Mitral stenosis   . Nonischemic cardiomyopathy ([place])    a. cath 05/2011 showed a 70% D1, a 50% OM1, a 50-60% left circumflex and a 20% right coronary artery. Ejection fraction was 20%. There was mild MR and severe AI. There was mild  pulmonary hypertension; b.echocardiogram 2/13 EF 20-25%, restrictive filling pattern, mod AS with mod  to severe AI. 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 mod to severe MR, mod LAE, mild RVE, mild RAE, mod TR  . Ovarian cyst    ovarian cystic mass  . Personal history of noncompliance with medical treatment, presenting hazards to health   . Pulmonary hypertension ([place])   . Pulmonary nodule, left    f/u chest CT 2/12: interval clearing of RUL air space nodule, borderline enlarged mediastinal lymph nodes stable, pul. arterial HTN  . Shortness of breath    "at rest" (01/04/2012)  . Ventricular tachycardia ([place]) 08/28/2016   CL 280 msec    Past Surgical History:  Procedure Laterality Date  . AORTIC VALVE REPLACEMENT  08/04/2011   Procedure: AORTIC VALVE REPLACEMENT (AVR);  Surgeon: Melrose Nakayama, MD;  Location: [place];  Service: Open Heart Surgery;  Laterality: N/A;  WITH ROOT ENLARGEMENT  . CARDIAC CATHETERIZATION  2008; ~ 2011;  05/2011  . CARDIAC DEFIBRILLATOR PLACEMENT  01/04/2012   MDT Protecta XT VR ICD implanted by Dr Rayann Heman  . CARDIAC VALVE REPLACEMENT    . ECTOPIC PREGNANCY SURGERY  1980's  . IMPLANTABLE CARDIOVERTER DEFIBRILLATOR IMPLANT N/A 01/04/2012   Procedure: IMPLANTABLE CARDIOVERTER DEFIBRILLATOR IMPLANT;  Surgeon: Thompson Grayer, MD;  Location[facility] C[facility] LAB;  Service: Cardiovascular;  Laterality: N/A;  . LEFT AND RIGHT HEART CATHETERIZATION WITH CORONARY ANGIOGRAM N/A 06/07/2011   Procedure: LEFT AND  RIGHT HEART CATHETERIZATION WITH CORONARY ANGIOGRAM;  Surgeon: Peter M Martinique, MD;  Location: [facility] CATH LAB;  Service: Cardiovascular;  Laterality: N/A;  . MITRAL VALVE REPAIR  08/04/2011   Procedure: MITRAL VALVE REPAIR (MVR);  Surgeon: Melrose Nakayama, MD;  Location: [place];  Service: Open Heart Surgery;  Laterality: N/A;  . TEE WITHOUT CARDIOVERSION  06/08/2011   Procedure: TRANSESOPHAGEAL ECHOCARDIOGRAM (TEE);  Surgeon: Lelon Perla, MD;  Location: [facility] ENDOSCOPY;  Service:  Cardiovascular;  Laterality: N/A;  . TONSILLECTOMY  ~ 1972     Current Outpatient Medications  Medication Sig Dispense Refill  . aspirin EC 325 MG tablet Take 325 mg by mouth daily.    Marland Kitchen atorvastatin (LIPITOR) 80 MG tablet TAKE 1 TABLET BY MOUTH ONCE DAILY 90 tablet 3  . carvedilol (COREG) 12.5 MG tablet TAKE 1 TABLET BY MOUTH TWICE DAILY WITH MEALS 180 tablet 3  . ENTRESTO 24-26 MG TAKE ONE TABLET BY MOUTH TWICE DAILY 60 tablet 11  . furosemide (LASIX) 80 MG tablet Take 2 tablets (160 mg total) by mouth 2 (two) times daily as needed (OK TO TAKE EXTRA FOR INCREASED SWELLING). 130 tablet 3  . KLOR-CON M20 20 MEQ tablet TAKE ONE TABLET BY MOUTH TWICE DAILY 60 tablet 11  . spironolactone (ALDACTONE) 25 MG tablet TAKE 1 TABLET BY MOUTH ONCE DAILY 90 tablet 3   No current facility-administered medications for this visit.     Allergies:   Penicillins    Social History:  The patient  reports that she quit smoking about 7 years ago. Her smoking use included cigarettes. She has a 10.00 pack-year smoking history. she has never used smokeless tobacco. She reports that she drinks about 8.4 oz of alcohol per week. She reports that she uses drugs. Drugs: Marijuana and Cocaine.   Family History:  The patient's family history includes COPD in her mother; Cancer in her brother; Cholecystitis in her mother; Coronary artery disease in her unknown relative; Heart attack in her maternal grandmother and mother; Heart disease in her maternal grandmother and mother; Hypertension in her mother.    ROS: All other systems are reviewed and negative. Unless otherwise mentioned in H&P    PHYSICAL EXAM: VS:  BP 115/62   Pulse (!) 57   Ht 5' 5.5" (1.664 m)   Wt 201 lb (91.2 kg)   LMP 08/05/2011   BMI 32.94 kg/m  , BMI Body mass index is 32.94 kg/m. GEN: Well nourished, well developed, in no acute distress  HEENT: normal  Neck: no JVD, carotid bruits, or masses Cardiac:RRR; distant heart sounds no murmurs,  rubs, or gallops,no edema  Respiratory:  clear to auscultation bilaterally, normal work of breathing GI: soft, nontender, nondistended, + BS MS: no deformity or atrophy  Skin: warm and dry, no rash Neuro:  Strength and sensation are intact Psych: euthymic mood, full affect   EKG: Sinus bradycardia with ventricular pacing, T-wave inversions noted laterally. Heart rate 57 bpm  Recent Labs: 09/06/2016: ALT 14; Magnesium 2.1 11/01/2016: BUN 32; Creatinine, Ser 1.74; Potassium 4.7; Sodium 140    Lipid Panel    Component Value Date/Time   CHOL 151 07/21/2015 0929   TRIG 73 07/21/2015 0929   HDL 47 07/21/2015 0929   CHOLHDL 3.2 07/21/2015 0929   VLDL 15 07/21/2015 0929   LDLCALC 89 07/21/2015 0929   LDLDIRECT 109.6 06/18/2011 1040      Wt Readings from Last 3 Encounters:  07/04/17 201 lb (91.2 kg)  04/18/17 204 lb (  92.5 kg)  11/01/16 191 lb (86.6 kg)      Other studies Reviewed: ICD interrogation:  04/18/2017 and reviewed by Garnette Gunner, NP  battery and lead measurements are good, no VT or device observations.  Optivol looks good.  Remote ICD transmission on 05/19/2016  10/22/16: TTE Study Conclusions - Left ventricle: The cavity size was severely dilated. Wall thickness was increased in a pattern of moderate LVH. Systolic function was severely reduced. The estimated ejection fraction was in the range of 20% to 25%. Features are consistent with a pseudonormal left ventricular filling pattern, with concomitant abnormal relaxation and increased filling pressure (grade 2 diastolic dysfunction). - Aortic valve: A bioprosthesis was present. - Mitral valve: Prior procedures included surgical repair. The findings are consistent with trivial stenosis. There was mild regurgitation. - Left atrium: The atrium was mildly dilated. (63mm) - Right ventricle: Systolic function was moderately reduced. - Right atrium: The atrium was moderately dilated. - Tricuspid valve: There  was mild-moderate regurgitation. - Pulmonary arteries: Systolic pressure was mildly to moderately increased. PA peak pressure: 39 mm Hg (S).   ASSESSMENT AND PLAN:  1.  Chronic diastolic heart failure: The patient is doing well without any evidence of fluid retention. I have refilled her Lasix that ordered extra doses to provide for her more medication in case she has to take additional Lasix without running out of her normal monthly regimen supply. She will have a BMET today.  2. Valvular heart disease: Status post atrial valve repair along with aortic root repair. And mitral valve repair Doing well. No changes in medications or further testing at this time.   3. ICD in situ: Monthly remote checks with 2,with Q 6 months in person visit, per EP  4. Nonischemic cardiomyopathy: She remains on Entresto, carvedilol, and spironolactone and diuretic. BMET today.  Current medicines are reviewed at length with the patient today.    Labs/ tests ordered today include: BMET Phill Myron. [place] Pugh, ANP, AACC   07/04/2017 12:33 PM     [facility] [address] [place], [place] [address] Phone: [phone]; Fax: [phone]

## 2017-07-04 ENCOUNTER — Encounter: Payer: Self-pay | Admitting: Adult Health

## 2017-07-04 ENCOUNTER — Ambulatory Visit (INDEPENDENT_AMBULATORY_CARE_PROVIDER_SITE_OTHER): Payer: Medicaid Other | Admitting: Adult Health

## 2017-07-04 VITALS — BP 115/62 | HR 57 | Ht 65.5 in | Wt 201.0 lb

## 2017-07-04 DIAGNOSIS — Z79899 Other long term (current) drug therapy: Secondary | ICD-10-CM | POA: Diagnosis not present

## 2017-07-04 DIAGNOSIS — I5022 Chronic systolic (congestive) heart failure: Secondary | ICD-10-CM | POA: Diagnosis not present

## 2017-07-04 DIAGNOSIS — Z95810 Presence of automatic (implantable) cardiac defibrillator: Secondary | ICD-10-CM

## 2017-07-04 DIAGNOSIS — Z9889 Other specified postprocedural states: Secondary | ICD-10-CM

## 2017-07-04 DIAGNOSIS — I428 Other cardiomyopathies: Secondary | ICD-10-CM

## 2017-07-04 DIAGNOSIS — Z8679 Personal history of other diseases of the circulatory system: Secondary | ICD-10-CM | POA: Diagnosis not present

## 2017-07-04 LAB — BASIC METABOLIC PANEL
BUN/Creatinine Ratio: 17 (ref 9–23)
BUN: 25 mg/dL — ABNORMAL HIGH (ref 6–24)
CALCIUM: 9.2 mg/dL (ref 8.7–10.2)
CO2: 21 mmol/L (ref 20–29)
CREATININE: 1.47 mg/dL — AB (ref 0.57–1.00)
Chloride: 105 mmol/L (ref 96–106)
GFR calc Af Amer: 46 mL/min/{1.73_m2} — ABNORMAL LOW (ref >59)
GFR, EST NON AFRICAN AMERICAN: 40 mL/min/{1.73_m2} — AB (ref >59)
GLUCOSE: 110 mg/dL — AB (ref 65–99)
POTASSIUM: 4.3 mmol/L (ref 3.5–5.2)
SODIUM: 141 mmol/L (ref 134–144)

## 2017-07-04 MED ORDER — FUROSEMIDE 80 MG PO TABS: 160.0000 mg | ORAL_TABLET | Freq: Two times a day (BID) | ORAL | 3 refills | Status: AC | PRN

## 2017-07-04 NOTE — Patient Instructions (Signed)
Medication Instructions:   TAKE LASIX 160MG  TWICE DAILY-OK TO TAKE EXTRA AS NEEDED FOR ADDITIONAL SWELLING  If you need a refill on your cardiac medications before your next appointment, please call your pharmacy.  Labwork: BMET TODAY HERE IN OUR OFFICE AT [facility]  Take the provided lab slips for you to take with you to the lab for you blood draw.   Follow-Up: Your physician wants you to follow-up in: [place] should receive a reminder letter in the mail two months in advance. If you do not receive a letter, please call our office 10-2017 to schedule the 12-2017 follow-up appointment.   Thank you for choosing [facility] at [facility]!!

## 2017-07-21 ENCOUNTER — Telehealth: Payer: Self-pay

## 2017-07-21 ENCOUNTER — Ambulatory Visit (INDEPENDENT_AMBULATORY_CARE_PROVIDER_SITE_OTHER): Payer: Medicaid Other

## 2017-07-21 DIAGNOSIS — I5022 Chronic systolic (congestive) heart failure: Secondary | ICD-10-CM | POA: Diagnosis not present

## 2017-07-21 DIAGNOSIS — Z95810 Presence of automatic (implantable) cardiac defibrillator: Secondary | ICD-10-CM

## 2017-07-21 NOTE — Telephone Encounter (Signed)
Remote ICM transmission received.  Attempted call to patient and left detailed message per DPR regarding transmission and next ICM scheduled for 08/22/2017.  Advised to return call for any fluid symptoms or questions.    

## 2017-07-21 NOTE — Progress Notes (Signed)
EPIC Encounter for ICM Monitoring  Patient Name: [name] is a 57 y.o. female Date: 07/21/2017 Primary Care Physican: Caroline More, DO Primary Cardiologist:Crenshaw Electrophysiologist: Allred Dry Weight:does not weigh      Attempted call to patient and unable to reach.  Left detailed message regarding transmission.  Transmission reviewed.    Thoracic impedance normal.   Prescribed dosage: Furosemide 80 mg Take 2 tablets (160 mg total) by mouth 2 (two) times daily as needed (OK TO TAKE EXTRA FOR INCREASED SWELLING). Potassium 20 mEq 1 tablet twice a day. Spironolactone 25 mg 1 tablet daily.   Labs: 07/14/2017 Creatinine 1.47, BUN 25, Potassium 4.3, Sodium 141, EGFR 40-46 11/01/2016 Creatinine 1.74, BUN 32, Potassium 4.7, Sodium 140, EGFR 32-37 09/06/2016 Creatinine 1.25, BUN 21, Potassium 3.9, Sodium 142 03/08/2016 Creatinine 1.38, BUN 19, Potassium 4.4, Sodium 139 02/23/2016 Creatinine 1.54, BUN 33, Potassium 4.5, Sodium 139  07/21/2015 Creatinine 1.42, BUN 33, Potassium 4.7, Sodium 136  09/22/2016Creatinine 1.26, BUN 33, Potassium 4.5, Sodium 136  Recommendations: Left voice mail with ICM number and encouraged to call if experiencing any fluid symptoms.  Follow-up plan: ICM clinic phone appointment on 08/22/2017.   Copy of ICM check sent to Dr. Rayann Heman.   3 month ICM trend: 07/21/2017    1 Year ICM trend:       [name], RN 07/21/2017 9:21 AM

## 2017-08-22 ENCOUNTER — Ambulatory Visit (INDEPENDENT_AMBULATORY_CARE_PROVIDER_SITE_OTHER): Payer: Medicaid Other | Admitting: *Deleted

## 2017-08-22 DIAGNOSIS — I428 Other cardiomyopathies: Secondary | ICD-10-CM | POA: Diagnosis not present

## 2017-08-22 DIAGNOSIS — Z95810 Presence of automatic (implantable) cardiac defibrillator: Secondary | ICD-10-CM | POA: Diagnosis not present

## 2017-08-22 DIAGNOSIS — I5022 Chronic systolic (congestive) heart failure: Secondary | ICD-10-CM | POA: Diagnosis not present

## 2017-08-22 NOTE — Progress Notes (Signed)
Remote ICD transmission.   

## 2017-08-23 NOTE — Progress Notes (Signed)
EPIC Encounter for ICM Monitoring  Patient Name: [name] is a 57 y.o. female Date: 08/23/2017 Primary Care Physican: Caroline More, DO Primary Cardiologist:Crenshaw Electrophysiologist: Allred Dry Weight:200 lbs        Heart Failure questions reviewed, pt asymptomatic.  She is working on weight loss.   Thoracic impedance normal.  Prescribed dosage: Furosemide 80 mg Take 2 tablets (160 mg total) by mouth 2 (two) times daily as needed (OK TO TAKE EXTRA FOR INCREASED SWELLING). Potassium 20 mEq 1 tablet twice a day. Spironolactone 25 mg 1 tablet daily.   Labs: 07/14/2017 Creatinine 1.47, BUN 25, Potassium 4.3, Sodium 141, EGFR 40-46 11/01/2016 Creatinine 1.74, BUN 32, Potassium 4.7, Sodium 140, EGFR 32-37 09/06/2016 Creatinine 1.25, BUN 21, Potassium 3.9, Sodium 142 03/08/2016 Creatinine 1.38, BUN 19, Potassium 4.4, Sodium 139 02/23/2016 Creatinine 1.54, BUN 33, Potassium 4.5, Sodium 139  07/21/2015 Creatinine 1.42, BUN 33, Potassium 4.7, Sodium 136  09/22/2016Creatinine 1.26, BUN 33, Potassium 4.5, Sodium 136  Recommendations: No changes.  Encouraged to call for fluid symptoms.  Follow-up plan: ICM clinic phone appointment on 09/22/2017.    Copy of ICM check sent to Dr. Rayann Heman.   3 month ICM trend: 08/22/2017    1 Year ICM trend:       [name], RN 08/23/2017 1:43 PM

## 2017-08-24 ENCOUNTER — Encounter: Payer: Self-pay | Admitting: Cardiology

## 2017-09-09 LAB — CUP PACEART REMOTE DEVICE CHECK
HIGH POWER IMPEDANCE MEASURED VALUE: 74 Ohm
HighPow Impedance: 437 Ohm
Implantable Lead Implant Date: 20130917
Implantable Lead Location: 753860
Implantable Lead Model: 181
Lead Channel Pacing Threshold Pulse Width: 0.4 ms
Lead Channel Setting Pacing Amplitude: 2.5 V
Lead Channel Setting Pacing Pulse Width: 0.4 ms
MDC IDC LEAD SERIAL: 323765
MDC IDC MSMT BATTERY VOLTAGE: 3.05 V
MDC IDC MSMT LEADCHNL RV IMPEDANCE VALUE: 399 Ohm
MDC IDC MSMT LEADCHNL RV PACING THRESHOLD AMPLITUDE: 0.875 V
MDC IDC MSMT LEADCHNL RV SENSING INTR AMPL: 21.125 mV
MDC IDC MSMT LEADCHNL RV SENSING INTR AMPL: 21.125 mV
MDC IDC PG IMPLANT DT: 20130917
MDC IDC SESS DTM: 20190506062828
MDC IDC SET LEADCHNL RV SENSING SENSITIVITY: 0.3 mV
MDC IDC STAT BRADY RV PERCENT PACED: 5.34 %

## 2017-09-22 ENCOUNTER — Ambulatory Visit (INDEPENDENT_AMBULATORY_CARE_PROVIDER_SITE_OTHER): Payer: Medicaid Other

## 2017-09-22 DIAGNOSIS — Z95810 Presence of automatic (implantable) cardiac defibrillator: Secondary | ICD-10-CM | POA: Diagnosis not present

## 2017-09-22 DIAGNOSIS — I5022 Chronic systolic (congestive) heart failure: Secondary | ICD-10-CM | POA: Diagnosis not present

## 2017-09-22 NOTE — Progress Notes (Signed)
EPIC Encounter for ICM Monitoring  Patient Name: [name] is a 57 y.o. female Date: 09/22/2017 Primary Care Physican: Caroline More, DO Primary Cardiologist:Crenshaw Electrophysiologist: Allred Dry Weight:200 lbs       Heart Failure questions reviewed, pt has a cough.  She had a cold during decreased impedance.   Thoracic impedance normal.  Prescribed dosage: Furosemide 80 mgTake 2 tablets (160 mg total) by mouth 2 (two) times daily as needed (OK TO TAKE EXTRA FOR INCREASED SWELLING).Potassium 20 mEq 1 tablet twice a day. Spironolactone 25 mg 1 tablet daily.   Labs: 07/14/2017 Creatinine 1.47, BUN 25, Potassium 4.3, Sodium 141, EGFR 40-46 11/01/2016 Creatinine 1.74, BUN 32, Potassium 4.7, Sodium 140, EGFR 32-37 09/06/2016 Creatinine 1.25, BUN 21, Potassium 3.9, Sodium 142 03/08/2016 Creatinine 1.38, BUN 19, Potassium 4.4, Sodium 139 02/23/2016 Creatinine 1.54, BUN 33, Potassium 4.5, Sodium 139  07/21/2015 Creatinine 1.42, BUN 33, Potassium 4.7, Sodium 136  09/22/2016Creatinine 1.26, BUN 33, Potassium 4.5, Sodium 136  Recommendations: No changes.  Encouraged to call for fluid symptoms.  Follow-up plan: ICM clinic phone appointment on 10/24/2017.    Copy of ICM check sent to Dr. Rayann Heman.   3 month ICM trend: 09/22/2017    1 Year ICM trend:       [name], RN 09/22/2017 1:48 PM

## 2017-10-11 ENCOUNTER — Telehealth: Payer: Self-pay | Admitting: Adult Health

## 2017-10-11 NOTE — Telephone Encounter (Signed)
Dr. Kirk Ruths

## 2017-10-11 NOTE — Telephone Encounter (Signed)
New Message   Butch Penny at [facility] is calling to get a death certificate signed so that the pt can be cremated, gave fax number, sent email to HIM of deceased patients

## 2017-10-13 NOTE — Telephone Encounter (Signed)
DEATH CERTIFICATE SIGNED

## 2017-10-17 DEATH — deceased

## 2017-10-28 ENCOUNTER — Telehealth: Payer: Self-pay

## 2017-10-28 NOTE — Telephone Encounter (Signed)
10/18/17 - Spoke with [facility]. They are aware D/C to ready for pick up.  10/28/17 [facility] picked up D/C.
